# Patient Record
Sex: Male | Born: 1955
Health system: Southern US, Community
[De-identification: ages and names within clinical notes are randomized; demographics above are authoritative.]

## PROBLEM LIST (undated history)

## (undated) DIAGNOSIS — K648 Other hemorrhoids: Secondary | ICD-10-CM

## (undated) DIAGNOSIS — E785 Hyperlipidemia, unspecified: Secondary | ICD-10-CM

## (undated) DIAGNOSIS — K219 Gastro-esophageal reflux disease without esophagitis: Secondary | ICD-10-CM

## (undated) DIAGNOSIS — I2510 Atherosclerotic heart disease of native coronary artery without angina pectoris: Secondary | ICD-10-CM

## (undated) DIAGNOSIS — N400 Enlarged prostate without lower urinary tract symptoms: Secondary | ICD-10-CM

## (undated) DIAGNOSIS — M109 Gout, unspecified: Secondary | ICD-10-CM

## (undated) DIAGNOSIS — I5020 Unspecified systolic (congestive) heart failure: Secondary | ICD-10-CM

## (undated) DIAGNOSIS — Z8249 Family history of ischemic heart disease and other diseases of the circulatory system: Secondary | ICD-10-CM

## (undated) DIAGNOSIS — R0600 Dyspnea, unspecified: Secondary | ICD-10-CM

## (undated) DIAGNOSIS — I10 Essential (primary) hypertension: Secondary | ICD-10-CM

## (undated) DIAGNOSIS — I519 Heart disease, unspecified: Secondary | ICD-10-CM

## (undated) DIAGNOSIS — I444 Left anterior fascicular block: Secondary | ICD-10-CM

## (undated) DIAGNOSIS — I77810 Thoracic aortic ectasia: Secondary | ICD-10-CM

## (undated) DIAGNOSIS — R339 Retention of urine, unspecified: Secondary | ICD-10-CM

## (undated) HISTORY — DX: Atherosclerotic heart disease of native coronary artery without angina pectoris: I25.10

## (undated) HISTORY — PX: RETINAL DETACHMENT SURGERY: SHX105

## (undated) HISTORY — PX: TONSILLECTOMY: SUR1361

## (undated) HISTORY — PX: ABDOMINOPLASTY: SUR9

## (undated) HISTORY — DX: Benign prostatic hyperplasia without lower urinary tract symptoms: N40.0

## (undated) HISTORY — DX: Gout, unspecified: M10.9

## (undated) HISTORY — PX: CATARACT EXTRACTION: SUR2

## (undated) HISTORY — DX: Hyperlipidemia, unspecified: E78.5

## (undated) HISTORY — PX: DENTAL SURGERY: SHX609

## (undated) HISTORY — PX: NO PAST SURGERIES: SHX2092

## (undated) HISTORY — DX: Family history of ischemic heart disease and other diseases of the circulatory system: Z82.49

## (undated) HISTORY — PX: OTHER SURGICAL HISTORY: SHX169

## (undated) HISTORY — DX: Heart disease, unspecified: I51.9

---

## 1997-10-12 ENCOUNTER — Other Ambulatory Visit: Admission: RE | Admit: 1997-10-12 | Discharge: 1997-10-12 | Payer: Self-pay | Admitting: Specialist

## 2006-10-26 ENCOUNTER — Ambulatory Visit: Payer: Self-pay | Admitting: Gastroenterology

## 2016-04-24 ENCOUNTER — Other Ambulatory Visit: Payer: Self-pay | Admitting: Gastroenterology

## 2016-04-27 ENCOUNTER — Other Ambulatory Visit: Payer: Self-pay | Admitting: Gastroenterology

## 2016-06-29 ENCOUNTER — Encounter (HOSPITAL_COMMUNITY): Payer: Self-pay

## 2016-06-29 ENCOUNTER — Encounter (HOSPITAL_COMMUNITY)
Admission: RE | Disposition: A | Discharge status: Home / Self Care | Payer: Self-pay | Source: Ambulatory Visit | Attending: Gastroenterology

## 2016-06-29 ENCOUNTER — Ambulatory Visit (HOSPITAL_COMMUNITY)
Admission: RE | Admit: 2016-06-29 | Discharge: 2016-06-29 | Disposition: A | Discharge status: Home / Self Care | Payer: 59 | Source: Ambulatory Visit | Attending: Gastroenterology | Admitting: Gastroenterology

## 2016-06-29 ENCOUNTER — Ambulatory Visit (HOSPITAL_COMMUNITY): Payer: 59 | Admitting: Anesthesiology

## 2016-06-29 DIAGNOSIS — E039 Hypothyroidism, unspecified: Secondary | ICD-10-CM | POA: Diagnosis not present

## 2016-06-29 DIAGNOSIS — K562 Volvulus: Secondary | ICD-10-CM | POA: Insufficient documentation

## 2016-06-29 DIAGNOSIS — K648 Other hemorrhoids: Secondary | ICD-10-CM | POA: Insufficient documentation

## 2016-06-29 DIAGNOSIS — N400 Enlarged prostate without lower urinary tract symptoms: Secondary | ICD-10-CM | POA: Insufficient documentation

## 2016-06-29 DIAGNOSIS — E739 Lactose intolerance, unspecified: Secondary | ICD-10-CM | POA: Diagnosis not present

## 2016-06-29 DIAGNOSIS — Z1211 Encounter for screening for malignant neoplasm of colon: Secondary | ICD-10-CM | POA: Insufficient documentation

## 2016-06-29 DIAGNOSIS — I10 Essential (primary) hypertension: Secondary | ICD-10-CM | POA: Insufficient documentation

## 2016-06-29 DIAGNOSIS — K219 Gastro-esophageal reflux disease without esophagitis: Secondary | ICD-10-CM | POA: Insufficient documentation

## 2016-06-29 HISTORY — PX: COLONOSCOPY WITH PROPOFOL: SHX5780

## 2016-06-29 HISTORY — DX: Essential (primary) hypertension: I10

## 2016-06-29 SURGERY — COLONOSCOPY WITH PROPOFOL
Anesthesia: Monitor Anesthesia Care

## 2016-06-29 MED ORDER — LIDOCAINE 2% (20 MG/ML) 5 ML SYRINGE
INTRAMUSCULAR | Status: AC
  Filled 2016-06-29: qty 5

## 2016-06-29 MED ORDER — LACTATED RINGERS IV SOLN
INTRAVENOUS | Status: DC
  Administered 2016-06-29: 12:00:00 via INTRAVENOUS

## 2016-06-29 MED ORDER — PROPOFOL 10 MG/ML IV BOLUS
INTRAVENOUS | Status: DC | PRN
  Administered 2016-06-29 (×6): 20 mg via INTRAVENOUS

## 2016-06-29 MED ORDER — PROPOFOL 500 MG/50ML IV EMUL
INTRAVENOUS | Status: DC | PRN
  Administered 2016-06-29: 140 ug/kg/min via INTRAVENOUS

## 2016-06-29 MED ORDER — PROPOFOL 10 MG/ML IV BOLUS
INTRAVENOUS | Status: AC
  Filled 2016-06-29: qty 20

## 2016-06-29 MED ORDER — PROPOFOL 10 MG/ML IV BOLUS
INTRAVENOUS | Status: AC
  Filled 2016-06-29: qty 40

## 2016-06-29 MED ORDER — SODIUM CHLORIDE 0.9 % IV SOLN: INTRAVENOUS | Status: DC

## 2016-06-29 SURGICAL SUPPLY — 22 items

## 2016-06-29 NOTE — Anesthesia Preprocedure Evaluation (Signed)
Anesthesia Evaluation  Patient identified by MRN, date of birth, ID band Patient awake    Reviewed: Allergy & Precautions, NPO status , Patient's Chart, lab work & pertinent test results  History of Anesthesia Complications Negative for: history of anesthetic complications  Airway Mallampati: II  TM Distance: >3 FB Neck ROM: Full    Dental no notable dental hx. (+) Dental Advisory Given   Pulmonary neg pulmonary ROS,    Pulmonary exam normal        Cardiovascular negative cardio ROS Normal cardiovascular exam     Neuro/Psych negative neurological ROS  negative psych ROS   GI/Hepatic negative GI ROS, Neg liver ROS,   Endo/Other  negative endocrine ROS  Renal/GU negative Renal ROS  negative genitourinary   Musculoskeletal negative musculoskeletal ROS (+)   Abdominal   Peds negative pediatric ROS (+)  Hematology negative hematology ROS (+)   Anesthesia Other Findings   Reproductive/Obstetrics negative OB ROS                             Anesthesia Physical Anesthesia Plan  ASA: II  Anesthesia Plan: MAC   Post-op Pain Management:    Induction:   Airway Management Planned: Natural Airway  Additional Equipment:   Intra-op Plan:   Post-operative Plan:   Informed Consent: I have reviewed the patients History and Physical, chart, labs and discussed the procedure including the risks, benefits and alternatives for the proposed anesthesia with the patient or authorized representative who has indicated his/her understanding and acceptance.   Dental advisory given  Plan Discussed with: CRNA, Anesthesiologist and Surgeon  Anesthesia Plan Comments:         Anesthesia Quick Evaluation

## 2016-06-29 NOTE — Transfer of Care (Signed)
Immediate Anesthesia Transfer of Care Note  Patient: Jeff Warren  Procedure(s) Performed: Procedure(s): COLONOSCOPY WITH PROPOFOL (N/A)  Patient Location: Endoscopy Unit  Anesthesia Type:MAC  Level of Consciousness: awake and alert   Airway & Oxygen Therapy: Patient Spontanous Breathing  Post-op Assessment: Report given to RN and Post -op Vital signs reviewed and stable  Post vital signs: Reviewed and stable  Last Vitals:  Vitals:   06/29/16 1214  BP: (!) 159/98  Pulse: 60  Resp: (!) 9  Temp: 36.8 C    Last Pain:  Vitals:   06/29/16 1214  TempSrc: Oral         Complications: No apparent anesthesia complications

## 2016-06-29 NOTE — Op Note (Addendum)
[facility] Patient Name: Jeff Warren Procedure Date: 06/29/2016 MRN: [id number] Attending MD: Charolett Bumpers , MD Date of Birth: 05-May-1956 CSN: [id number] Age: 61 Admit Type: Outpatient Procedure:                Colonoscopy Indications:              Screening for colorectal malignant neoplasm Providers:                Charolett Bumpers, MD, Anthony Sar, RN, Arlee Muslim Tech., Technician, Leroy Libman, CRNA Referring MD:              Medicines:                Propofol per Anesthesia Complications:            No immediate complications. Estimated Blood Loss:     Estimated blood loss: none. Procedure:                Pre-Anesthesia Assessment:                           - Prior to the procedure, a History and Physical                            was performed, and patient medications and                            allergies were reviewed. The patient's tolerance of                            previous anesthesia was also reviewed. The risks                            and benefits of the procedure and the sedation                            options and risks were discussed with the patient.                            All questions were answered, and informed consent                            was obtained. Prior Anticoagulants: The patient has                            taken no previous anticoagulant or antiplatelet                            agents. ASA Grade Assessment: II - A patient with                            mild systemic disease. After reviewing the risks  and benefits, the patient was deemed in                            satisfactory condition to undergo the procedure.                           After obtaining informed consent, the colonoscope                            was passed under direct vision. Throughout the                            procedure, the patient's blood pressure, pulse, and                   oxygen saturations were monitored continuously. The                            EC-[id number] ([id number]) scope was introduced through                            the anus and advanced to the the cecum, identified                            by appendiceal orifice and ileocecal valve. The                            colonoscopy was technically difficult and complex                            due to significant looping. The patient tolerated                            the procedure well. The quality of the bowel                            preparation was good. The appendiceal orifice and                            the rectum were photographed. Scope In: 1:18:19 PM Scope Out: 2:00:47 PM Scope Withdrawal Time: 0 hours 10 minutes 16 seconds  Total Procedure Duration: 0 hours 42 minutes 28 seconds  Findings:      The perianal and digital rectal examinations were normal. Prolapsed       internal hemorrhoids.      The entire examined colon appeared normal. Impression:               - The entire examined colon is normal.                           - No specimens collected. Moderate Sedation:      N/A- Per Anesthesia Care Recommendation:           - Patient has a contact number available for  emergencies. The signs and symptoms of potential                            delayed complications were discussed with the                            patient. Return to normal activities tomorrow.                            Written discharge instructions were provided to the                            patient.                           - Repeat colonoscopy in 10 years for screening                            purposes.                           - Resume previous diet.                           - Continue present medications. Procedure Code(s):        --- Professional ---                           Z6109G0121, Colorectal cancer screening; colonoscopy on                             individual not meeting criteria for high risk Diagnosis Code(s):        --- Professional ---                           Z12.11, Encounter for screening for malignant                            neoplasm of colon CPT copyright 2016 [employer]. All rights reserved. The codes documented in this report are preliminary and upon coder review may  be revised to meet current compliance requirements. Danise EdgeMartin Johnson, MD Charolett BumpersMartin K Johnson, MD 06/29/2016 2:11:02 PM This report has been signed electronically. Number of Addenda: 0

## 2016-06-29 NOTE — H&P (Signed)
Procedure: Screening colonoscopy  History: The patient is a 61 year old male born 11-20-1955. He is scheduled to undergo a screening colonoscopy today  Past medical history: Hypothyroidism. Hypertension. Lactose intolerance. Gastroesophageal reflux. Benign prostatic hypertrophy. Hemorrhoidal banding. Right wrist surgery.  Exam: The patient is alert and lying comfortably on the endoscopy stretcher. Abdomen is soft and nontender to palpation. Lungs are clear to auscultation. Cardiac exam reveals a regular rhythm.  Plan: Proceed with screening colonoscopy

## 2016-06-29 NOTE — Discharge Instructions (Signed)

## 2016-06-29 NOTE — Anesthesia Postprocedure Evaluation (Signed)
Anesthesia Post Note  Patient: Jeff Warren  Procedure(s) Performed: Procedure(s) (LRB): COLONOSCOPY WITH PROPOFOL (N/A)  Patient location during evaluation: Endoscopy Anesthesia Type: MAC Level of consciousness: awake and alert Pain management: pain level controlled Vital Signs Assessment: post-procedure vital signs reviewed and stable Respiratory status: spontaneous breathing and respiratory function stable Cardiovascular status: stable Anesthetic complications: no       Last Vitals:  Vitals:   06/29/16 1410 06/29/16 1420  BP: (!) 105/59 (!) 158/83  Pulse: 76 75  Resp: 17 18  Temp: 36.4 C     Last Pain:  Vitals:   06/29/16 1410  TempSrc: Oral                 Rana Hochstein DANIEL

## 2016-06-30 ENCOUNTER — Encounter (HOSPITAL_COMMUNITY): Payer: Self-pay

## 2016-06-30 ENCOUNTER — Ambulatory Visit (HOSPITAL_COMMUNITY): Admit: 2016-06-30 | Payer: Self-pay | Admitting: Gastroenterology

## 2016-06-30 ENCOUNTER — Encounter (HOSPITAL_COMMUNITY): Payer: Self-pay | Admitting: Gastroenterology

## 2016-06-30 SURGERY — COLONOSCOPY WITH PROPOFOL
Anesthesia: Monitor Anesthesia Care

## 2016-11-07 NOTE — Anesthesia Postprocedure Evaluation (Signed)
Anesthesia Post Note  Patient: Jeff Warren  Procedure(s) Performed: Procedure(s) (LRB): COLONOSCOPY WITH PROPOFOL (N/A)     Anesthesia Type: MAC    Last Vitals:  Vitals:   06/29/16 1410 06/29/16 1420  BP: (!) 105/59 (!) 158/83  Pulse: 76 75  Resp: 17 18  Temp: 36.4 C     Last Pain:  Vitals:   06/29/16 1410  TempSrc: Oral                 Wilsie Kern DANIEL

## 2016-11-07 NOTE — Addendum Note (Signed)
Addendum  created 11/07/16 0826 by Aliscia Clayton, MD   Sign clinical note    

## 2016-12-05 ENCOUNTER — Inpatient Hospital Stay
Admission: EM | Admit: 2016-12-05 | Discharge: 2016-12-07 | DRG: 247 | Disposition: A | Discharge status: Home / Self Care | Payer: 59 | Attending: Internal Medicine | Admitting: Internal Medicine

## 2016-12-05 ENCOUNTER — Emergency Department: Payer: 59

## 2016-12-05 ENCOUNTER — Inpatient Hospital Stay (HOSPITAL_COMMUNITY)
Admit: 2016-12-05 | Discharge: 2016-12-05 | Disposition: A | Discharge status: Home / Self Care | Payer: 59 | Attending: Cardiovascular Disease | Admitting: Cardiovascular Disease

## 2016-12-05 ENCOUNTER — Encounter: Payer: Self-pay | Admitting: Internal Medicine

## 2016-12-05 ENCOUNTER — Encounter
Admission: EM | Disposition: A | Discharge status: Home / Self Care | Payer: Self-pay | Source: Home / Self Care | Attending: Internal Medicine

## 2016-12-05 DIAGNOSIS — M109 Gout, unspecified: Secondary | ICD-10-CM | POA: Diagnosis present

## 2016-12-05 DIAGNOSIS — I370 Nonrheumatic pulmonary valve stenosis: Secondary | ICD-10-CM

## 2016-12-05 DIAGNOSIS — Z8249 Family history of ischemic heart disease and other diseases of the circulatory system: Secondary | ICD-10-CM | POA: Diagnosis not present

## 2016-12-05 DIAGNOSIS — I472 Ventricular tachycardia: Secondary | ICD-10-CM | POA: Diagnosis not present

## 2016-12-05 DIAGNOSIS — Z79899 Other long term (current) drug therapy: Secondary | ICD-10-CM

## 2016-12-05 DIAGNOSIS — N400 Enlarged prostate without lower urinary tract symptoms: Secondary | ICD-10-CM | POA: Diagnosis present

## 2016-12-05 DIAGNOSIS — N179 Acute kidney failure, unspecified: Secondary | ICD-10-CM | POA: Diagnosis present

## 2016-12-05 DIAGNOSIS — I495 Sick sinus syndrome: Secondary | ICD-10-CM | POA: Diagnosis present

## 2016-12-05 DIAGNOSIS — I2111 ST elevation (STEMI) myocardial infarction involving right coronary artery: Secondary | ICD-10-CM | POA: Diagnosis not present

## 2016-12-05 DIAGNOSIS — E785 Hyperlipidemia, unspecified: Secondary | ICD-10-CM | POA: Diagnosis present

## 2016-12-05 DIAGNOSIS — I10 Essential (primary) hypertension: Secondary | ICD-10-CM | POA: Diagnosis present

## 2016-12-05 DIAGNOSIS — I2119 ST elevation (STEMI) myocardial infarction involving other coronary artery of inferior wall: Principal | ICD-10-CM | POA: Diagnosis present

## 2016-12-05 DIAGNOSIS — I2510 Atherosclerotic heart disease of native coronary artery without angina pectoris: Secondary | ICD-10-CM | POA: Diagnosis present

## 2016-12-05 DIAGNOSIS — I255 Ischemic cardiomyopathy: Secondary | ICD-10-CM | POA: Diagnosis present

## 2016-12-05 DIAGNOSIS — Z888 Allergy status to other drugs, medicaments and biological substances status: Secondary | ICD-10-CM | POA: Diagnosis not present

## 2016-12-05 HISTORY — PX: CORONARY/GRAFT ACUTE MI REVASCULARIZATION: CATH118305

## 2016-12-05 HISTORY — PX: LEFT HEART CATH AND CORONARY ANGIOGRAPHY: CATH118249

## 2016-12-05 HISTORY — DX: ST elevation (STEMI) myocardial infarction involving other coronary artery of inferior wall: I21.19

## 2016-12-05 LAB — ECHOCARDIOGRAM COMPLETE
HEIGHTINCHES: 73 in
WEIGHTICAEL: 3760 [oz_av]

## 2016-12-05 LAB — POTASSIUM: POTASSIUM: 3.3 mmol/L — AB (ref 3.5–5.1)

## 2016-12-05 LAB — CBC
HCT: 43.1 % (ref 40.0–52.0)
Hemoglobin: 15.3 g/dL (ref 13.0–18.0)
MCH: 31.9 pg (ref 26.0–34.0)
MCHC: 35.4 g/dL (ref 32.0–36.0)
MCV: 90.1 fL (ref 80.0–100.0)
PLATELETS: 213 10*3/uL (ref 150–440)
RBC: 4.79 MIL/uL (ref 4.40–5.90)
RDW: 14 % (ref 11.5–14.5)
WBC: 6.3 10*3/uL (ref 3.8–10.6)

## 2016-12-05 LAB — PROTIME-INR
INR: 0.92
PROTHROMBIN TIME: 12.4 s (ref 11.4–15.2)

## 2016-12-05 LAB — BASIC METABOLIC PANEL
Anion gap: 5 (ref 5–15)
BUN: 14 mg/dL (ref 6–20)
CALCIUM: 8.9 mg/dL (ref 8.9–10.3)
CHLORIDE: 108 mmol/L (ref 101–111)
CO2: 25 mmol/L (ref 22–32)
CREATININE: 1.28 mg/dL — AB (ref 0.61–1.24)
GFR calc non Af Amer: 59 mL/min — ABNORMAL LOW (ref >60)
Glucose, Bld: 163 mg/dL — ABNORMAL HIGH (ref 65–99)
Potassium: 3.3 mmol/L — ABNORMAL LOW (ref 3.5–5.1)
SODIUM: 138 mmol/L (ref 135–145)

## 2016-12-05 LAB — GLUCOSE, CAPILLARY
Glucose-Capillary: 132 mg/dL — ABNORMAL HIGH (ref 65–99)
Glucose-Capillary: 89 mg/dL (ref 65–99)

## 2016-12-05 LAB — MRSA PCR SCREENING: MRSA BY PCR: NEGATIVE

## 2016-12-05 LAB — APTT: APTT: 26 s (ref 24–36)

## 2016-12-05 LAB — POCT ACTIVATED CLOTTING TIME: Activated Clotting Time: 334 seconds

## 2016-12-05 LAB — TROPONIN I

## 2016-12-05 LAB — MAGNESIUM: MAGNESIUM: 1.8 mg/dL (ref 1.7–2.4)

## 2016-12-05 SURGERY — LEFT HEART CATH AND CORONARY ANGIOGRAPHY
Anesthesia: Moderate Sedation

## 2016-12-05 MED ORDER — HEPARIN SODIUM (PORCINE) 1000 UNIT/ML IJ SOLN
INTRAMUSCULAR | Status: AC
  Filled 2016-12-05: qty 1

## 2016-12-05 MED ORDER — HEPARIN BOLUS VIA INFUSION
4000.0000 [IU] | Freq: Once | INTRAVENOUS | Status: DC
Start: 2016-12-05 — End: 2016-12-05
  Filled 2016-12-05: qty 4000

## 2016-12-05 MED ORDER — ATORVASTATIN CALCIUM 20 MG PO TABS
80.0000 mg | ORAL_TABLET | Freq: Every day | ORAL | Status: DC
  Administered 2016-12-05 – 2016-12-06 (×2): 80 mg via ORAL
  Filled 2016-12-05 (×2): qty 4

## 2016-12-05 MED ORDER — TICAGRELOR 90 MG PO TABS
90.0000 mg | ORAL_TABLET | Freq: Two times a day (BID) | ORAL | Status: DC
  Administered 2016-12-05 – 2016-12-07 (×5): 90 mg via ORAL
  Filled 2016-12-05 (×6): qty 1

## 2016-12-05 MED ORDER — TIROFIBAN HCL IN NACL 5-0.9 MG/100ML-% IV SOLN
INTRAVENOUS | Status: AC
  Filled 2016-12-05: qty 100

## 2016-12-05 MED ORDER — NITROGLYCERIN 5 MG/ML IV SOLN
INTRAVENOUS | Status: AC
  Filled 2016-12-05: qty 10

## 2016-12-05 MED ORDER — HEPARIN (PORCINE) IN NACL 2-0.9 UNIT/ML-% IJ SOLN
INTRAMUSCULAR | Status: AC
Start: 2016-12-05 — End: ?
  Filled 2016-12-05: qty 500

## 2016-12-05 MED ORDER — NITROGLYCERIN 1 MG/10 ML FOR IR/CATH LAB
INTRA_ARTERIAL | Status: DC | PRN
  Administered 2016-12-05: 200 ug via INTRACORONARY

## 2016-12-05 MED ORDER — ASPIRIN 81 MG PO CHEW: 324.0000 mg | CHEWABLE_TABLET | Freq: Once | ORAL | Status: DC

## 2016-12-05 MED ORDER — ASPIRIN 81 MG PO CHEW
CHEWABLE_TABLET | ORAL | Status: AC
  Filled 2016-12-05: qty 4

## 2016-12-05 MED ORDER — ACETAMINOPHEN 325 MG PO TABS: 650.0000 mg | ORAL_TABLET | ORAL | Status: DC | PRN

## 2016-12-05 MED ORDER — OMEPRAZOLE MAGNESIUM 20 MG PO TBEC: 20.0000 mg | DELAYED_RELEASE_TABLET | Freq: Every day | ORAL | Status: DC | PRN

## 2016-12-05 MED ORDER — HEPARIN SODIUM (PORCINE) 5000 UNIT/ML IJ SOLN
4000.0000 [IU] | Freq: Once | INTRAMUSCULAR | Status: AC
  Administered 2016-12-05: 4000 [IU] via INTRAVENOUS

## 2016-12-05 MED ORDER — HEPARIN SODIUM (PORCINE) 1000 UNIT/ML IJ SOLN
INTRAMUSCULAR | Status: DC | PRN
  Administered 2016-12-05: 2000 [IU] via INTRAVENOUS
  Administered 2016-12-05: 5000 [IU] via INTRAVENOUS

## 2016-12-05 MED ORDER — SODIUM CHLORIDE 0.9 % IV SOLN
250.0000 mL | INTRAVENOUS | Status: DC | PRN
Start: 2016-12-05 — End: 2016-12-07

## 2016-12-05 MED ORDER — ASPIRIN 81 MG PO CHEW
81.0000 mg | CHEWABLE_TABLET | Freq: Every day | ORAL | Status: DC
  Administered 2016-12-05 – 2016-12-07 (×3): 81 mg via ORAL
  Filled 2016-12-05 (×3): qty 1

## 2016-12-05 MED ORDER — TIROFIBAN HCL IN NACL 5-0.9 MG/100ML-% IV SOLN: 0.1500 ug/kg/min | INTRAVENOUS | Status: DC

## 2016-12-05 MED ORDER — LISINOPRIL 10 MG PO TABS
10.0000 mg | ORAL_TABLET | Freq: Every day | ORAL | Status: DC
  Administered 2016-12-05 – 2016-12-07 (×3): 10 mg via ORAL
  Filled 2016-12-05 (×2): qty 2
  Filled 2016-12-05: qty 1

## 2016-12-05 MED ORDER — TICAGRELOR 90 MG PO TABS
ORAL_TABLET | ORAL | Status: AC
  Filled 2016-12-05: qty 2

## 2016-12-05 MED ORDER — VERAPAMIL HCL 2.5 MG/ML IV SOLN
INTRAVENOUS | Status: AC
  Filled 2016-12-05: qty 2

## 2016-12-05 MED ORDER — ASPIRIN 81 MG PO CHEW
324.0000 mg | CHEWABLE_TABLET | Freq: Once | ORAL | Status: AC
  Administered 2016-12-05: 324 mg via ORAL

## 2016-12-05 MED ORDER — MIDAZOLAM HCL 2 MG/2ML IJ SOLN
INTRAMUSCULAR | Status: AC
  Filled 2016-12-05: qty 2

## 2016-12-05 MED ORDER — FENTANYL CITRATE (PF) 100 MCG/2ML IJ SOLN
INTRAMUSCULAR | Status: AC
  Filled 2016-12-05: qty 2

## 2016-12-05 MED ORDER — ATROPINE SULFATE 1 MG/10ML IJ SOSY
PREFILLED_SYRINGE | INTRAMUSCULAR | Status: AC
  Filled 2016-12-05: qty 10

## 2016-12-05 MED ORDER — HEPARIN (PORCINE) IN NACL 2-0.9 UNIT/ML-% IJ SOLN
INTRAMUSCULAR | Status: AC
  Filled 2016-12-05: qty 500

## 2016-12-05 MED ORDER — SODIUM CHLORIDE 0.9 % IV BOLUS (SEPSIS)
1000.0000 mL | Freq: Once | INTRAVENOUS | Status: AC
  Administered 2016-12-05: 1000 mL via INTRAVENOUS

## 2016-12-05 MED ORDER — ATROPINE SULFATE 1 MG/10ML IJ SOSY
PREFILLED_SYRINGE | INTRAMUSCULAR | Status: DC | PRN
  Administered 2016-12-05: 1 mg via INTRAVENOUS

## 2016-12-05 MED ORDER — SODIUM CHLORIDE 0.9% FLUSH
3.0000 mL | Freq: Two times a day (BID) | INTRAVENOUS | Status: DC
  Administered 2016-12-05 – 2016-12-07 (×5): 3 mL via INTRAVENOUS

## 2016-12-05 MED ORDER — TIROFIBAN (AGGRASTAT) BOLUS VIA INFUSION
INTRAVENOUS | Status: DC | PRN
Start: 2016-12-05 — End: 2016-12-05
  Administered 2016-12-05: 2665 ug via INTRAVENOUS

## 2016-12-05 MED ORDER — PANTOPRAZOLE SODIUM 40 MG PO TBEC: 40.0000 mg | DELAYED_RELEASE_TABLET | Freq: Every day | ORAL | Status: DC | PRN

## 2016-12-05 MED ORDER — ENOXAPARIN SODIUM 40 MG/0.4ML ~~LOC~~ SOLN
40.0000 mg | SUBCUTANEOUS | Status: DC
  Administered 2016-12-06: 40 mg via SUBCUTANEOUS
  Filled 2016-12-05 (×2): qty 0.4

## 2016-12-05 MED ORDER — SODIUM CHLORIDE 0.9% FLUSH: 3.0000 mL | INTRAVENOUS | Status: DC | PRN

## 2016-12-05 MED ORDER — TIROFIBAN HCL IV 12.5 MG/250 ML
0.1500 ug/kg/min | INTRAVENOUS | Status: AC
  Administered 2016-12-05 (×2): 0.15 ug/kg/min via INTRAVENOUS
  Filled 2016-12-05: qty 250

## 2016-12-05 MED ORDER — SODIUM CHLORIDE 0.9 % IV SOLN
INTRAVENOUS | Status: AC
  Administered 2016-12-05: 07:00:00 via INTRAVENOUS

## 2016-12-05 MED ORDER — TICAGRELOR 90 MG PO TABS
ORAL_TABLET | ORAL | Status: DC | PRN
  Administered 2016-12-05: 180 mg via ORAL

## 2016-12-05 MED ORDER — CARVEDILOL 6.25 MG PO TABS
6.2500 mg | ORAL_TABLET | Freq: Two times a day (BID) | ORAL | Status: DC
  Administered 2016-12-05 – 2016-12-07 (×5): 6.25 mg via ORAL
  Filled 2016-12-05 (×5): qty 1

## 2016-12-05 MED ORDER — HEPARIN (PORCINE) IN NACL 100-0.45 UNIT/ML-% IJ SOLN
1300.0000 [IU]/h | INTRAMUSCULAR | Status: DC
  Administered 2016-12-05: 1300 [IU]/h via INTRAVENOUS
  Filled 2016-12-05: qty 250

## 2016-12-05 MED ORDER — IOPAMIDOL (ISOVUE-300) INJECTION 61%
INTRAVENOUS | Status: DC | PRN
  Administered 2016-12-05: 155 mL via INTRA_ARTERIAL

## 2016-12-05 MED ORDER — TAMSULOSIN HCL 0.4 MG PO CAPS
0.4000 mg | ORAL_CAPSULE | Freq: Every day | ORAL | Status: DC
Start: 2016-12-05 — End: 2016-12-07
  Administered 2016-12-05 – 2016-12-07 (×3): 0.4 mg via ORAL
  Filled 2016-12-05 (×3): qty 1

## 2016-12-05 MED ORDER — ONDANSETRON HCL 4 MG/2ML IJ SOLN: 4.0000 mg | Freq: Four times a day (QID) | INTRAMUSCULAR | Status: DC | PRN

## 2016-12-05 SURGICAL SUPPLY — 15 items
BALLN TREK RX 2.5X15 (BALLOONS) ×3
BALLN ~~LOC~~ TREK RX 4.0X12 (BALLOONS) ×3
BALLOON TREK RX 2.5X15 (BALLOONS) IMPLANT
BALLOON ~~LOC~~ TREK RX 4.0X12 (BALLOONS) IMPLANT
CATH INFINITI 5 FR JL3.5 (CATHETERS) ×2 IMPLANT
CATH INFINITI 5FR ANG PIGTAIL (CATHETERS) ×2 IMPLANT
CATH VISTA GUIDE 6FR JR4 (CATHETERS) ×2 IMPLANT
DEVICE INFLAT 30 PLUS (MISCELLANEOUS) ×2 IMPLANT
DEVICE RAD COMP TR BAND LRG (VASCULAR PRODUCTS) ×2 IMPLANT
GLIDESHEATH SLEND SS 6F .021 (SHEATH) ×2 IMPLANT
KIT MANI 3VAL PERCEP (MISCELLANEOUS) ×3 IMPLANT
PACK CARDIAC CATH (CUSTOM PROCEDURE TRAY) ×3 IMPLANT
STENT RESOLUTE ONYX 3.5X15 (Permanent Stent) ×2 IMPLANT
WIRE INTUITION PROPEL ST 180CM (WIRE) ×2 IMPLANT
WIRE ROSEN-J .035X260CM (WIRE) ×2 IMPLANT

## 2016-12-05 NOTE — H&P (Signed)
Jeff Warren is an 61 y.o. male.   Chief Complaint: Chest pain HPI: The patient with past medical history of hypertension presents to the emergency department with chest pain. His pain began while he was at work which requires him to be on his feet but is not very strenuous. He began to feel lightheaded and became very diaphoretic. The patient denies shortness of breath or significant nausea. She tried to sit down and rest but the pain did not resolve. It was located over his left chest and radiated slightly into his left shoulder. It was like pressure in character and did not resolve after taking aspirin. EKG was concerning for inferior wall ischemia and the patient was taken to the Cath Lab where he received a stent to his RCA. The patient recovered uneventfully and was admitted to the ICU for post catheterization management.  Past Medical History:  Diagnosis Date  . Hypertension     Past Surgical History:  Procedure Laterality Date  . ABDOMINOPLASTY    . COLONOSCOPY WITH PROPOFOL N/A 06/29/2016   Procedure: COLONOSCOPY WITH PROPOFOL;  Surgeon: Garlan Fair, MD;  Location: [facility] ENDOSCOPY;  Service: Endoscopy;  Laterality: N/A;  . ganglion cyst removal Right   . NO PAST SURGERIES     Has has some cosmetic surgeries    Family History  Problem Relation Age of Onset  . Diabetes Mellitus II Mother   . Cancer Father   . Diabetes Mellitus II Sister   . Diabetes Mellitus II Brother   . Kidney failure Brother    Social History:  reports that he has never smoked. He has never used smokeless tobacco. He reports that he does not drink alcohol or use drugs.  Allergies:  Allergies  Allergen Reactions  . Bextra [Valdecoxib] Swelling    Medications Prior to Admission  Medication Sig Dispense Refill  . amLODipine-benazepril (LOTREL) 5-10 MG capsule Take 1 capsule by mouth daily.    . colchicine 0.6 MG tablet Take 0.6 mg by mouth 2 (two) times daily as needed (gout).    Marland Kitchen omeprazole (PRILOSEC  OTC) 20 MG tablet Take 20 mg by mouth daily as needed (hjeartburn).    . probenecid (BENEMID) 500 MG tablet Take 500 mg by mouth 2 (two) times daily.    . tamsulosin (FLOMAX) 0.4 MG CAPS capsule Take 0.4 mg by mouth daily.      Results for orders placed or performed during the hospital encounter of 12/05/16 (from the past 48 hour(s))  Glucose, capillary     Status: Abnormal   Collection Time: 12/05/16  4:03 AM  Result Value Ref Range   Glucose-Capillary 132 (H) 65 - 99 mg/dL   Comment 1 Notify RN    Comment 2 Document in Chart   Basic metabolic panel     Status: Abnormal   Collection Time: 12/05/16  4:07 AM  Result Value Ref Range   Sodium 138 135 - 145 mmol/L   Potassium 3.3 (L) 3.5 - 5.1 mmol/L   Chloride 108 101 - 111 mmol/L   CO2 25 22 - 32 mmol/L   Glucose, Bld 163 (H) 65 - 99 mg/dL   BUN 14 6 - 20 mg/dL   Creatinine, Ser 1.28 (H) 0.61 - 1.24 mg/dL   Calcium 8.9 8.9 - 10.3 mg/dL   GFR calc non Af Amer 59 (L) >60 mL/min   GFR calc Af Amer >60 >60 mL/min    Comment: (NOTE) The eGFR has been calculated using the CKD EPI  equation. This calculation has not been validated in all clinical situations. eGFR's persistently <60 mL/min signify possible Chronic Kidney Disease.    Anion gap 5 5 - 15  CBC     Status: None   Collection Time: 12/05/16  4:07 AM  Result Value Ref Range   WBC 6.3 3.8 - 10.6 K/uL   RBC 4.79 4.40 - 5.90 MIL/uL   Hemoglobin 15.3 13.0 - 18.0 g/dL   HCT 31.2 39.3 - 11.1 %   MCV 90.1 80.0 - 100.0 fL   MCH 31.9 26.0 - 34.0 pg   MCHC 35.4 32.0 - 36.0 g/dL   RDW 51.6 31.1 - 89.1 %   Platelets 213 150 - 440 K/uL  Troponin I     Status: None   Collection Time: 12/05/16  4:07 AM  Result Value Ref Range   Troponin I <0.03 <0.03 ng/mL  APTT     Status: None   Collection Time: 12/05/16  4:07 AM  Result Value Ref Range   aPTT 26 24 - 36 seconds  Protime-INR     Status: None   Collection Time: 12/05/16  4:07 AM  Result Value Ref Range   Prothrombin Time 12.4  11.4 - 15.2 seconds   INR 0.92   POCT Activated clotting time     Status: None   Collection Time: 12/05/16  5:51 AM  Result Value Ref Range   Activated Clotting Time 334 seconds   Dg Chest 2 View  Result Date: 12/05/2016 CLINICAL DATA:  Chest pain and diaphoresis at 1 hour duration EXAM: CHEST  2 VIEW COMPARISON:  None. FINDINGS: The lungs are clear. The pulmonary vasculature is normal. Heart size is normal. Hilar and mediastinal contours are unremarkable. There is no pleural effusion. IMPRESSION: No active cardiopulmonary disease. Electronically Signed   By: Ellery Plunk M.D.   On: 12/05/2016 05:06    Review of Systems  Constitutional: Positive for diaphoresis. Negative for chills and fever.  HENT: Negative for sore throat and tinnitus.   Eyes: Negative for blurred vision and redness.  Respiratory: Negative for cough and shortness of breath.   Cardiovascular: Positive for chest pain. Negative for palpitations, orthopnea and PND.  Gastrointestinal: Positive for nausea. Negative for abdominal pain, diarrhea and vomiting.  Genitourinary: Negative for dysuria, frequency and urgency.  Musculoskeletal: Negative for joint pain and myalgias.  Skin: Negative for rash.       No lesions  Neurological: Negative for speech change, focal weakness and weakness.  Endo/Heme/Allergies: Does not bruise/bleed easily.       No temperature intolerance  Psychiatric/Behavioral: Negative for depression and suicidal ideas.    Blood pressure (!) 132/91, pulse (!) 52, temperature 97.8 F (36.6 C), temperature source Oral, resp. rate (!) 7, height 6\' 1"  (1.854 m), weight 106.6 kg (235 lb), SpO2 100 %. Physical Exam  Constitutional: He is oriented to person, place, and time. He appears well-developed and well-nourished. No distress.  HENT:  Head: Normocephalic and atraumatic.  Mouth/Throat: Oropharynx is clear and moist.  Eyes: Conjunctivae and EOM are normal. Pupils are equal, round, and reactive to  light. No scleral icterus.  Neck: Normal range of motion. Neck supple. No JVD present. No tracheal deviation present. No thyromegaly present.  Cardiovascular: Normal rate and regular rhythm.  Exam reveals no gallop and no friction rub.   No murmur heard. Respiratory: Effort normal and breath sounds normal. No respiratory distress.  GI: Soft. Bowel sounds are normal. He exhibits no distension. There is no tenderness.  Genitourinary:  Genitourinary Comments: Deferred  Musculoskeletal: Normal range of motion. He exhibits no edema.  Lymphadenopathy:    He has no cervical adenopathy.  Neurological: He is alert and oriented to person, place, and time. No cranial nerve deficit.  Skin: Skin is warm and dry. No rash noted. No erythema.  Psychiatric: He has a normal mood and affect. His behavior is normal. Judgment and thought content normal.     Assessment/Plan This is a 61 year old male admitted for STEMI. 1. STEMI: Inferior wall MI status post PCI to RCA. The patient also has an approximately 80% lesion to the LAD which cardiology will follow up as an outpatient. Aggrastat post cath; admission orders per cardiology.  2. Hypertension: Needs improvement; Coreg and lisinopril started by cardiology. May need to resume amlodipine per home regimen. 3. Acute kidney injury: Mild bump in creatinine; GFR greater than 60. Hydrate with intravenous fluid. Avoid nephrotoxic agents. 4. CAD: Aspirin and Lipitor for secondary prevention. Monitor for reperfusion arrhythmia 5. BPH: Continue Flomax 6. DVT prophylaxis: Lovenox 7. GI prophylaxis: Pantoprazole The patient is a full code. Time spent on admission orders and critical care approximately 45 minutes  Harrie Foreman, MD 12/05/2016, 7:03 AM

## 2016-12-05 NOTE — Progress Notes (Signed)
Patient seen and examined.  [age]M with PMH of HTN and family history of premature CAD admitted with chest pain. Inferior wall STEMI and underwent PCI to RCA. Stable post-procedure. Denies chest pain or shortness of breath. No critical care needs. Will be available as needed. Defer management to 1 team / cardiology.  Dorothyann GibbsPrag Casson Catena MD

## 2016-12-05 NOTE — ED Notes (Signed)
Placed patient's shoes, pants, underwear, black zip wallet, clothing, and hat in 2 belonging bags. Bags placed on back of bed

## 2016-12-05 NOTE — Progress Notes (Signed)
ANTICOAGULATION CONSULT NOTE - Initial Consult  Pharmacy Consult for heparin drip Indication: chest pain/ACS/STEMI  Allergies  Allergen Reactions  . Bextra [Valdecoxib] Swelling    Patient Measurements: Height: 6\' 1"  (185.4 cm) Weight: 235 lb (106.6 kg) IBW/kg (Calculated) : 79.9 Heparin Dosing Weight: 102 kg  Vital Signs: Temp: 97.8 F (36.6 C) (06/30 0400) Temp Source: Oral (06/30 0400) BP: 132/91 (06/30 0517) Pulse Rate: 52 (06/30 0517)  Labs:  Recent Labs  12/05/16 0407  HGB 15.3  HCT 43.1  PLT 213  CREATININE 1.28*  TROPONINI <0.03    Estimated Creatinine Clearance: 77.7 mL/min (A) (by C-G formula based on SCr of 1.28 mg/dL (H)).   Medical History: Past Medical History:  Diagnosis Date  . Hypertension     Medications:  No anticoagulation in PTA meds  Assessment:  Goal of Therapy:  Heparin level 0.3-0.7 units/ml Monitor platelets by anticoagulation protocol: Yes   Plan:  4000 unit bolus and initial rate of 1300 units/hr. First heparin level 6 hours after start of infusion.  Maegan Buller S 12/05/2016,5:29 AM

## 2016-12-05 NOTE — Progress Notes (Signed)
CH received PG for a Code Stemi to ED Room 17A. CH reported to ED and talked with nurse who was working with PT. CH asked if any family was present, CH was informed that none was known. CH informed nurse that he was available if needed   12/05/16 0450  Clinical Encounter Type  Visited With Patient;Health care provider  Visit Type Initial  Referral From Nurse  Consult/Referral To Chaplain  Spiritual Encounters  Spiritual Needs Prayer;Other (Comment)  .

## 2016-12-05 NOTE — ED Provider Notes (Signed)
[facility] Emergency Department Provider Note _   First MD Initiated Contact with Patient 12/05/16 0430     (approximate)  I have reviewed the triage vital signs and the nursing notes.   HISTORY  Chief Complaint Chest Pain    HPI Jeff Warren is a 61 y.o. male history of hypertension presents to the emergency department with central chest pain which began at 2:30 AM area patient states his current pain score 7 out of 10 nonradiating. Patient also admits to dyspnea dizziness and diaphoresis.   Past Medical History:  Diagnosis Date  . Hypertension     There are no active problems to display for this patient.   Past Surgical History:  Procedure Laterality Date  . ABDOMINOPLASTY    . COLONOSCOPY WITH PROPOFOL N/A 06/29/2016   Procedure: COLONOSCOPY WITH PROPOFOL;  Surgeon: Charolett Bumpers, MD;  Location: [facility] ENDOSCOPY;  Service: Endoscopy;  Laterality: N/A;  . ganglion cyst removal Right   . NO PAST SURGERIES     Has has some cosmetic surgeries    Prior to Admission medications   Medication Sig Start Date End Date Taking? Authorizing Provider  amLODipine-benazepril (LOTREL) 5-10 MG capsule Take 1 capsule by mouth daily.    [provider]  colchicine 0.6 MG tablet Take 0.6 mg by mouth 2 (two) times daily as needed (gout).    [provider]  omeprazole (PRILOSEC OTC) 20 MG tablet Take 20 mg by mouth daily as needed (hjeartburn).    [provider]  probenecid (BENEMID) 500 MG tablet Take 500 mg by mouth 2 (two) times daily.    [provider]  tamsulosin (FLOMAX) 0.4 MG CAPS capsule Take 0.4 mg by mouth daily.    [provider]    Allergies Bextra [valdecoxib]  No family history on file.  Social History Social History  Substance Use Topics  . Smoking status: Never Smoker  . Smokeless tobacco: Never Used  . Alcohol use No    Review of Systems Constitutional: No fever/chills Eyes: No visual  changes. ENT: No sore throat. Cardiovascular: Positive for chest pain. Respiratory: Denies shortness of breath. Gastrointestinal: No abdominal pain.  No nausea, no vomiting.  No diarrhea.  No constipation. Genitourinary: Negative for dysuria. Musculoskeletal: Negative for neck pain.  Negative for back pain. Integumentary: Negative for rash. Neurological: Negative for headaches, focal weakness or numbness.  ____________________________________________   PHYSICAL EXAM:  VITAL SIGNS: ED Triage Vitals  Enc Vitals Group     BP 12/05/16 0400 99/64     Pulse Rate 12/05/16 0400 (!) 49     Resp 12/05/16 0400 18     Temp 12/05/16 0400 97.8 F (36.6 C)     Temp Source 12/05/16 0400 Oral     SpO2 12/05/16 0400 99 %     Weight 12/05/16 0356 106.6 kg (235 lb)     Height 12/05/16 0356 1.854 m (6\' 1" )     Head Circumference --      Peak Flow --      Pain Score 12/05/16 0354 0     Pain Loc --      Pain Edu? --      Excl. in GC? --     Constitutional: Alert and oriented. Diaphoretic Eyes: Conjunctivae are normal.  Head: Atraumatic. Mouth/Throat: Mucous membranes are moist. Neck: No stridor.   Cardiovascular: Bradycardia, . Good peripheral circulation. Grossly normal heart sounds. Respiratory: Normal respiratory effort.  No retractions. Lungs CTAB. Gastrointestinal: Soft  and nontender. No distention.  Musculoskeletal: No lower extremity tenderness nor edema. No gross deformities of extremities. Neurologic:  Normal speech and language. No gross focal neurologic deficits are appreciated.  Skin:  Skin is warm, dry and intact. No rash noted. Psychiatric: Mood and affect are normal. Speech and behavior are normal.  ____________________________________________   LABS (all labs ordered are listed, but only abnormal results are displayed)  Labs Reviewed  BASIC METABOLIC PANEL - Abnormal; Notable for the following:       Result Value   Potassium 3.3 (*)    Glucose, Bld 163 (*)     Creatinine, Ser 1.28 (*)    GFR calc non Af Amer 59 (*)    All other components within normal limits  GLUCOSE, CAPILLARY - Abnormal; Notable for the following:    Glucose-Capillary 132 (*)    All other components within normal limits  CBC  TROPONIN I   ____________________________________________  EKG  ED ECG REPORT I, [place] N Anjelita Sheahan, the attending physician, personally viewed and interpreted this ECG.   Date: 12/05/2016  EKG Time: 3:56 AM Rate: 50  Rhythm: Sinus bradycardia  Axis: Normal  Intervals: Normal  ST&T Change: None  ED ECG REPORT I, [place] N Sieanna Vanstone, the attending physician, personally viewed and interpreted this ECG.   Date: 12/05/2016  EKG Time: 4:41 AM  Rate: 53  Rhythm: Sinus bradycardia  Axis: Normal  Intervals: Normal  ST&T Change: ST segment elevations in leads 3 and aVF, T-wave inversions V1 and V2   ____________________________________________  RADIOLOGY I,  N Sinead Hockman, personally viewed and evaluated these images (plain radiographs) as part of my medical decision making, as well as reviewing the written report by the radiologist.  No results found.  ____________________________________________   PROCEDURES  Critical Care performed:CRITICAL CARE Performed by: Darci Current   Total critical care time: 40 minutes  Critical care time was exclusive of separately billable procedures and treating other patients.  Critical care was necessary to treat or prevent imminent or life-threatening deterioration.  Critical care was time spent personally by me on the following activities: development of treatment plan with patient and/or surrogate as well as nursing, discussions with consultants, evaluation of patient's response to treatment, examination of patient, obtaining history from patient or surrogate, ordering and performing treatments and interventions, ordering and review of laboratory studies, ordering and review of radiographic  studies, pulse oximetry and re-evaluation of patient's condition.   Procedures   ____________________________________________   INITIAL IMPRESSION / ASSESSMENT AND PLAN / ED COURSE  Pertinent labs & imaging results that were available during my care of the patient were reviewed by me and considered in my medical decision making (see chart for details).  61 year old male presenting with history concerning for acute myocardial infarction. Initial EKG did not reveal any evidence of a STEMI however I repeated the EKG during my evaluation patient which clearly revealed ST segment elevation in leads 3 and aVF with anterior T-wave inversions. As such patient discussed with Dr. Kennith Maes at 4:45 AM who agrees. Plan to have the patient taken to the Cath Lab.    ____________________________________________  FINAL CLINICAL IMPRESSION(S) / ED DIAGNOSES  Final diagnoses:  Acute ST elevation myocardial infarction (STEMI) involving other coronary artery of inferior wall (HCC)     MEDICATIONS GIVEN DURING THIS VISIT:  Medications  heparin bolus via infusion 4,000 Units (not administered)  heparin ADULT infusion 100 units/mL (25000 units/233mL sodium chloride 0.45%) (not administered)  sodium chloride 0.9 % bolus 1,000  mL (1,000 mLs Intravenous New Bag/Given 12/05/16 0419)  aspirin chewable tablet 324 mg (324 mg Oral Given 12/05/16 0443)  heparin injection 4,000 Units (4,000 Units Intravenous Given 12/05/16 0451)     NEW OUTPATIENT MEDICATIONS STARTED DURING THIS VISIT:  New Prescriptions   No medications on file    Modified Medications   No medications on file    Discontinued Medications   No medications on file     Note:  This document was prepared using Dragon voice recognition software and may include unintentional dictation errors.    Darci CurrentBrown, [place] N, MD 12/05/16 [phone]

## 2016-12-05 NOTE — Progress Notes (Signed)
Pt admitted this am with acute IMI s/p DES with modest LV dysfunction 4-45% No chest pain

## 2016-12-05 NOTE — ED Triage Notes (Signed)
Pt reports having chest pain which started x 1 hour ago. Pt is diaphoretic upon this time in triage and denying pain at this time.

## 2016-12-05 NOTE — Progress Notes (Signed)
MEDICATION RELATED CONSULT NOTE - INITIAL   Pharmacy Consult for tirofiban drip Indication: post PCI  Allergies  Allergen Reactions  . Bextra [Valdecoxib] Swelling    Patient Measurements: Height: 6\' 1"  (185.4 cm) Weight: 235 lb (106.6 kg) IBW/kg (Calculated) : 79.9 Adjusted Body Weight: n/a  Vital Signs: Temp: 97.8 F (36.6 C) (06/30 0400) Temp Source: Oral (06/30 0400) BP: 132/91 (06/30 0517) Pulse Rate: 52 (06/30 0517) Intake/Output from previous day: No intake/output data recorded. Intake/Output from this shift: No intake/output data recorded.  Labs:  Recent Labs  12/05/16 0407  WBC 6.3  HGB 15.3  HCT 43.1  PLT 213  APTT 26  CREATININE 1.28*   Estimated Creatinine Clearance: 77.7 mL/min (A) (by C-G formula based on SCr of 1.28 mg/dL (H)).   Microbiology: No results found for this or any previous visit (from the past 720 hour(s)).  Medical History: Past Medical History:  Diagnosis Date  . Hypertension     Medications:    Assessment:   Goal of Therapy:    Plan:  Aggrastat ordered to run 12 hours after initiation. Bolus given at ~0600 per RN.  Oree Mirelez S 12/05/2016,7:09 AM

## 2016-12-05 NOTE — ED Notes (Signed)
Pt. States at around 2:30 this evening pt. States sudden onset of chest pain.

## 2016-12-05 NOTE — Consult Note (Signed)
CARDIOLOGY CONSULT NOTE  Patient ID: Jeff Warren MRN: [id number] DOB/AGE: 1955/11/14 61 y.o.  Admit date: 12/05/2016 Referring Physician:  Dr. Manson Passey Primary Physician: [name], MD Primary Cardiologist : New ([name]) Reason for Consultation : Inferior STEMI  Date:  12/05/2016    Chief Complaint  Patient presents with  . Chest Pain      History of Present Illness: Jeff Warren is a 61 y.o. male whom I was asked to see emergently by Dr. Manson Passey due to chest pain and EKG changes consistent with inferior ST elevation myocardial infarction. The patient has no prior cardiac history and has chronic medical conditions that include essential hypertension, gout and BPH. He is supposed to be taking amlodipine for high blood pressure but he has not been taking the medication. He is also on Flomax and takes colchicine as needed. He is not a smoker and he works at [employer] He has family history of premature coronary artery disease. His sister died of myocardial infarction at the age of 15.  He presented with sudden onset of chest pain that started around 2:30 this morning while he was at work. The chest pain was described as aching and heaviness located in the left side of his chest with radiation to his left shoulder. It was associated with nausea and profuse sweating and mild shortness of breath. It was continuous and did not subside. He also for dizzy and lightheaded but no syncope. He reports no previous similar symptoms. After his symptoms persisted, he was brought to the emergency room. His initial EKG was not diagnostic for inferior ST elevation MI given that there was only 1 mm of elevation in one day. Subsequently, an EKG was repeated which showed inferior ST elevation inferior leads with reciprocal ST depression in aVL.    Past Medical History:  Diagnosis Date  . Hypertension     Past Surgical History:  Procedure Laterality Date  . ABDOMINOPLASTY    . COLONOSCOPY WITH PROPOFOL  N/A 06/29/2016   Procedure: COLONOSCOPY WITH PROPOFOL;  Surgeon: Charolett Bumpers, MD;  Location: [facility];  Service: Endoscopy;  Laterality: N/A;  . ganglion cyst removal Right   . NO PAST SURGERIES     Has has some cosmetic surgeries     Current Facility-Administered Medications  Medication Dose Route Frequency Provider Last Rate Last Dose  . heparin ADULT infusion 100 units/mL (25000 units/283mL sodium chloride 0.45%)  1,300 Units/hr Intravenous Continuous Darci Current, MD 13 mL/hr at 12/05/16 0502 1,300 Units/hr at 12/05/16 0502    Allergies:   Bextra [valdecoxib]    Social History:  The patient  reports that he has never smoked. He has never used smokeless tobacco. He reports that he does not drink alcohol or use drugs.   Family History:  The patient's Family history is remarkable for premature coronary artery disease.   ROS:  Please see the history of present illness.   Otherwise, review of systems are positive for none.   All other systems are reviewed and negative.    PHYSICAL EXAM: VS:  BP (!) 132/91 (BP Location: Right Arm)   Pulse (!) 52   Temp 97.8 F (36.6 C) (Oral)   Resp (!) 7   Ht 6\' 1"  (1.854 m)   Wt 235 lb (106.6 kg)   SpO2 100%   BMI 31.00 kg/m  , BMI Body mass index is 31 kg/m. GEN: Well nourished, well developed, in no acute distress  HEENT: normal  Neck:  no JVD, carotid bruits, or masses Cardiac: RRR; no murmurs, rubs, or gallops,no edema  Respiratory:  clear to auscultation bilaterally, normal work of breathing GI: soft, nontender, nondistended, + BS MS: no deformity or atrophy  Skin: warm and dry, no rash Neuro:  Strength and sensation are intact Psych: euthymic mood, full affect   EKG:   Personally reviewed by me and showed:  Normal sinus rhythm with 1 mm of ST elevation in the inferior leads with reciprocal ST depression in aVL. Telemetry recently reviewed by me and showed: Normal sinus rhythm with sinus bradycardia and intermittent  nonsustained ventricular tachycardia.  Recent Labs: 12/05/2016: BUN 14; Creatinine, Ser 1.28; Hemoglobin 15.3; Platelets 213; Potassium 3.3; Sodium 138    Lipid Panel No results found for: CHOL, TRIG, HDL, CHOLHDL, VLDL, LDLCALC, LDLDIRECT    Wt Readings from Last 3 Encounters:  12/05/16 235 lb (106.6 kg)  06/29/16 228 lb (103.4 kg)      ASSESSMENT AND PLAN:  1.Acute inferior ST elevation myocardial infarction: The patient was given aspirin and unfractionated heparin and was taken emergently to the cath lab. Emergent cardiac catheterization was performed via the right radial artery which confirmed thrombotic occlusion of the distal right coronary artery. This was treated successfully with PCI and drug-eluting stent placement. He has residual significant stenosis in the proximal to mid LAD. EF was mildly reduced. Due to large thrombus burden, he was started on Aggrastat for 12 hours. He was loaded with Brilinta and was started on high dose atorvastatin in addition to carvedilol and lisinopril.  Continue close monitoring in the ICU for 24 hours. The patient did have intermittent nonsustained ventricular tachycardia.  We'll plan on staged LAD PCI in few weeks.  2. Mild ischemic cardiomyopathy: Repeat echocardiogram tomorrow. Carvedilol and lisinopril were started. I  3. Hyperlipidemia: High dose atorvastatin was started. Check lipid and liver profile tomorrow.  4. History of gout: No acute exacerbation.  5. BPH: Continue Flomax.    Signed,  Lorine BearsMuhammad Arida, MD  12/05/2016 6:53 AM    [facility]

## 2016-12-05 NOTE — Progress Notes (Signed)
Patient resting in bed.  No complaints or concerns at this time. Will continue to monitor.

## 2016-12-05 NOTE — ED Notes (Signed)
Per Dr. Keturah BarreArdia, waiting on cath team to arrive before transport to cath lab.

## 2016-12-06 LAB — HEPATIC FUNCTION PANEL
ALBUMIN: 3.4 g/dL — AB (ref 3.5–5.0)
ALK PHOS: 61 U/L (ref 38–126)
ALT: 32 U/L (ref 17–63)
AST: 140 U/L — ABNORMAL HIGH (ref 15–41)
BILIRUBIN TOTAL: 1.2 mg/dL (ref 0.3–1.2)
Bilirubin, Direct: 0.1 mg/dL (ref 0.1–0.5)
Indirect Bilirubin: 1.1 mg/dL — ABNORMAL HIGH (ref 0.3–0.9)
Total Protein: 6.1 g/dL — ABNORMAL LOW (ref 6.5–8.1)

## 2016-12-06 LAB — BASIC METABOLIC PANEL
Anion gap: 3 — ABNORMAL LOW (ref 5–15)
BUN: 14 mg/dL (ref 6–20)
CALCIUM: 8.6 mg/dL — AB (ref 8.9–10.3)
CO2: 24 mmol/L (ref 22–32)
CREATININE: 1 mg/dL (ref 0.61–1.24)
Chloride: 111 mmol/L (ref 101–111)
GFR calc non Af Amer: >60 mL/min (ref >60)
Glucose, Bld: 95 mg/dL (ref 65–99)
Potassium: 4.1 mmol/L (ref 3.5–5.1)
SODIUM: 138 mmol/L (ref 135–145)

## 2016-12-06 LAB — CBC
HCT: 42.4 % (ref 40.0–52.0)
Hemoglobin: 14.7 g/dL (ref 13.0–18.0)
MCH: 32.2 pg (ref 26.0–34.0)
MCHC: 34.7 g/dL (ref 32.0–36.0)
MCV: 92.9 fL (ref 80.0–100.0)
PLATELETS: 189 10*3/uL (ref 150–440)
RBC: 4.57 MIL/uL (ref 4.40–5.90)
RDW: 14.1 % (ref 11.5–14.5)
WBC: 9.3 10*3/uL (ref 3.8–10.6)

## 2016-12-06 LAB — LIPID PANEL
CHOL/HDL RATIO: 6 ratio
CHOLESTEROL: 197 mg/dL (ref 0–200)
HDL: 33 mg/dL — ABNORMAL LOW (ref >40)
LDL Cholesterol: 130 mg/dL — ABNORMAL HIGH (ref 0–99)
Triglycerides: 172 mg/dL — ABNORMAL HIGH (ref <150)
VLDL: 34 mg/dL (ref 0–40)

## 2016-12-06 MED ORDER — POTASSIUM CHLORIDE 20 MEQ PO PACK
60.0000 meq | PACK | Freq: Once | ORAL | Status: AC
  Administered 2016-12-06: 60 meq via ORAL
  Filled 2016-12-06: qty 3

## 2016-12-06 NOTE — Progress Notes (Signed)
Patient in bed resting.  No acute concerns. No complaints of pain.  Lab draw this am. Patient makes needs known.  Will continue to monitor.

## 2016-12-06 NOTE — Progress Notes (Signed)
Progress Note  Patient Name: Jeff Warren Date of Encounter: 12/06/2016  Primary Cardiologist: MA      Patient Profile     61 y.o. male s/p IMI s/p stenting 6/30  Subjective   Without chest pain sob difficulty with meds   Inpatient Medications    Scheduled Meds: . aspirin  81 mg Oral Daily  . atorvastatin  80 mg Oral q1800  . carvedilol  6.25 mg Oral BID WC  . enoxaparin (LOVENOX) injection  40 mg Subcutaneous Q24H  . lisinopril  10 mg Oral Daily  . sodium chloride flush  3 mL Intravenous Q12H  . tamsulosin  0.4 mg Oral Daily  . ticagrelor  90 mg Oral BID   Continuous Infusions: . sodium chloride     PRN Meds: sodium chloride, acetaminophen, ondansetron (ZOFRAN) IV, pantoprazole, sodium chloride flush   Vital Signs    Vitals:   12/06/16 0800 12/06/16 0900 12/06/16 1000 12/06/16 1100  BP: 121/86 (!) 131/93 113/81 111/78  Pulse: (!) 58 65 66 65  Resp: 14 20 16  (!) 22  Temp:      TempSrc:      SpO2: 99% 97% 99% 97%  Weight:      Height:        Intake/Output Summary (Last 24 hours) at 12/06/16 1145 Last data filed at 12/06/16 0934  Gross per 24 hour  Intake          1064.26 ml  Output              925 ml  Net           139.26 ml   Filed Weights   12/05/16 0356 12/05/16 2000  Weight: 235 lb (106.6 kg) 233 lb 11 oz (106 kg)    Telemetry    VTNS  - Personally Reviewed  ECG       Physical Exam   GEN: No acute distress.   Neck: JVD flat Cardiac: RRR, no  murmurs, rubs, or gallops.  Respiratory: Clear to auscultation bilaterally. GI: Soft, nontender, non-distended  MS:  no edema; No deformity. Neuro:  Nonfocal  Psych: Normal affect  Skin Warm and dry   Labs    Chemistry Recent Labs Lab 12/05/16 0407 12/05/16 1718 12/06/16 0546  NA 138  --  138  K 3.3* 3.3* 4.1  CL 108  --  111  CO2 25  --  24  GLUCOSE 163*  --  95  BUN 14  --  14  CREATININE 1.28*  --  1.00  CALCIUM 8.9  --  8.6*  PROT  --   --  6.1*  ALBUMIN  --   --  3.4*  AST   --   --  140*  ALT  --   --  32  ALKPHOS  --   --  61  BILITOT  --   --  1.2  GFRNONAA 59*  --  >60  GFRAA >60  --  >60  ANIONGAP 5  --  3*     Hematology Recent Labs Lab 12/05/16 0407 12/06/16 0546  WBC 6.3 9.3  RBC 4.79 4.57  HGB 15.3 14.7  HCT 43.1 42.4  MCV 90.1 92.9  MCH 31.9 32.2  MCHC 35.4 34.7  RDW 14.0 14.1  PLT 213 189    Cardiac Enzymes Recent Labs Lab 12/05/16 0407  TROPONINI <0.03   No results for input(s): TROPIPOC in the last 168 hours.   BNPNo results for input(s): BNP,  PROBNP in the last 168 hours.   DDimer No results for input(s): DDIMER in the last 168 hours.   Radiology    Dg Chest 2 View  Result Date: 12/05/2016 CLINICAL DATA:  Chest pain and diaphoresis at 1 hour duration EXAM: CHEST  2 VIEW COMPARISON:  None. FINDINGS: The lungs are clear. The pulmonary vasculature is normal. Heart size is normal. Hilar and mediastinal contours are unremarkable. There is no pleural effusion. IMPRESSION: No active cardiopulmonary disease. Electronically Signed   By: Ellery Plunkaniel R Mitchell M.D.   On: 12/05/2016 05:06      Assessment & Plan    MI acute infer with residual LAD disease   LV dysfunction mild  On ACE and BB,statins and DAPT  Anticipate discharge in am  Signed, Sherryl MangesSteven Thelia Tanksley, MD  12/06/2016, 11:45 AM

## 2016-12-06 NOTE — Progress Notes (Signed)
Subjective: Patient has no complaints today. No chest pain or shortness of breath.  Objective: Vital signs in last 24 hours: Temp:  [98.4 F (36.9 C)-98.8 F (37.1 C)] 98.5 F (36.9 C) (07/01 1200) Pulse Rate:  [55-71] 62 (07/01 1200) Resp:  [8-24] 14 (07/01 1200) BP: (111-144)/(78-105) 144/84 (07/01 1200) SpO2:  [96 %-100 %] 98 % (07/01 1200) Weight:  [106 kg (233 lb 11 oz)] 106 kg (233 lb 11 oz) (06/30 2000) Weight change: -0.595 kg (-1 lb 5 oz) Last BM Date: 12/05/16  Intake/Output from previous day: 06/30 0701 - 07/01 0700 In: 584.3 [P.O.:330; I.V.:254.3] Out: 1350 [Urine:1350] Intake/Output this shift: Total I/O In: 480 [P.O.:480] Out: -   General appearance: no distress Resp: clear to auscultation bilaterally and normal percussion bilaterally Cardio: regular rate and rhythm, S1, S2 normal, no murmur, click, rub or gallop GI: soft, non-tender; bowel sounds normal; no masses,  no organomegaly Neurologic: Grossly normal  Lab Results:  Recent Labs  12/05/16 0407 12/06/16 0546  WBC 6.3 9.3  HGB 15.3 14.7  HCT 43.1 42.4  PLT 213 189   BMET  Recent Labs  12/05/16 0407 12/05/16 1718 12/06/16 0546  NA 138  --  138  K 3.3* 3.3* 4.1  CL 108  --  111  CO2 25  --  24  GLUCOSE 163*  --  95  BUN 14  --  14  CREATININE 1.28*  --  1.00  CALCIUM 8.9  --  8.6*    Studies/Results: Dg Chest 2 View  Result Date: 12/05/2016 CLINICAL DATA:  Chest pain and diaphoresis at 1 hour duration EXAM: CHEST  2 VIEW COMPARISON:  None. FINDINGS: The lungs are clear. The pulmonary vasculature is normal. Heart size is normal. Hilar and mediastinal contours are unremarkable. There is no pleural effusion. IMPRESSION: No active cardiopulmonary disease. Electronically Signed   By: Ellery Plunkaniel R Mitchell M.D.   On: 12/05/2016 05:06    Medications: I have reviewed the patient's current medications.  Assessment/Plan: 1. STEMI. Status post stenting of RCA. Patient also has a percent LAD  lesion will be addressed later. Patient tolerated procedure yesterday well. No recurrent symptoms. 2. Hypertension. Medications have been adjusted. We'll monitor and adjust further if necessary. 3. Mild acute kidney injury. Today creatinine is normal. 4. Coronary artery disease. We'll continue current medications. Next line Total time spent 20 minutes  LOS: 1 day   Gracelyn NurseJohnston,  [place] D 12/06/2016, 12:02 PM

## 2016-12-06 NOTE — Progress Notes (Signed)
Patient resting in bed. No acute concerns at this time. Provider notified of K+ of 3.3  Order for PO potassium given.

## 2016-12-07 ENCOUNTER — Telehealth: Payer: Self-pay | Admitting: Physician Assistant

## 2016-12-07 ENCOUNTER — Encounter: Payer: Self-pay | Admitting: Cardiovascular Disease

## 2016-12-07 ENCOUNTER — Telehealth: Payer: Self-pay | Admitting: *Deleted

## 2016-12-07 LAB — HEMOGLOBIN A1C
Hgb A1c MFr Bld: 5.3 % (ref 4.8–5.6)
MEAN PLASMA GLUCOSE: 105 mg/dL

## 2016-12-07 MED ORDER — ASPIRIN 81 MG PO CHEW: 81.0000 mg | CHEWABLE_TABLET | Freq: Every day | ORAL | Status: DC

## 2016-12-07 MED ORDER — PANTOPRAZOLE SODIUM 40 MG PO TBEC: 40.0000 mg | DELAYED_RELEASE_TABLET | Freq: Every day | ORAL | 1 refills | Status: DC | PRN

## 2016-12-07 MED ORDER — TICAGRELOR 90 MG PO TABS: 90.0000 mg | ORAL_TABLET | Freq: Two times a day (BID) | ORAL | 1 refills | Status: DC

## 2016-12-07 MED ORDER — LISINOPRIL 10 MG PO TABS: 10.0000 mg | ORAL_TABLET | Freq: Every day | ORAL | 1 refills | Status: DC

## 2016-12-07 MED ORDER — CARVEDILOL 6.25 MG PO TABS: 6.2500 mg | ORAL_TABLET | Freq: Two times a day (BID) | ORAL | 1 refills | Status: DC

## 2016-12-07 MED ORDER — ATORVASTATIN CALCIUM 80 MG PO TABS: 80.0000 mg | ORAL_TABLET | Freq: Every day | ORAL | 1 refills | Status: DC

## 2016-12-07 NOTE — Telephone Encounter (Signed)
Left voicemail message to call back  

## 2016-12-07 NOTE — Telephone Encounter (Signed)
Per Alycia Rossettiyan, pt needs 7 day f/u. Message Martie LeeSabrina for sooner appt

## 2016-12-07 NOTE — Telephone Encounter (Signed)
Currently admitted.

## 2016-12-07 NOTE — Progress Notes (Signed)
Discharge instructions explained to pt and pts family/ verbalized an understanding/ iv and tele removed/ transported off unit via wheelchair.  

## 2016-12-07 NOTE — Progress Notes (Signed)
Patient Name: Jeff Warren Date of Encounter: 12/07/2016  Primary Cardiologist: new to [facility] - consult by [facility] Problem List     Active Problems:   Acute ST elevation myocardial infarction (STEMI) involving right coronary artery (HCC)   Acute ST elevation myocardial infarction (STEMI) of inferior wall (HCC)     Subjective   Admitted with inferior ST elevation MI. Underwent successful PCI/DES to the RCA. Residual disease of the LAD was noted with plans for staged PCI.   Has ambulated without issues. No chest pain or SOB. Needs Brilinta card. Post cath labs stable.   Inpatient Medications    Scheduled Meds: . aspirin  81 mg Oral Daily  . atorvastatin  80 mg Oral q1800  . carvedilol  6.25 mg Oral BID WC  . enoxaparin (LOVENOX) injection  40 mg Subcutaneous Q24H  . lisinopril  10 mg Oral Daily  . sodium chloride flush  3 mL Intravenous Q12H  . tamsulosin  0.4 mg Oral Daily  . ticagrelor  90 mg Oral BID   Continuous Infusions: . sodium chloride     PRN Meds: sodium chloride, acetaminophen, ondansetron (ZOFRAN) IV, pantoprazole, sodium chloride flush   Vital Signs    Vitals:   12/06/16 1100 12/06/16 1200 12/06/16 2048 12/07/16 0609  BP: 111/78 (!) 144/84 129/73 129/87  Pulse: 65 62 61 (!) 53  Resp: (!) 22 14 18 18   Temp:  98.5 F (36.9 C) 97.5 F (36.4 C) 97.8 F (36.6 C)  TempSrc:  Oral Oral Oral  SpO2: 97% 98% 97% 99%  Weight:      Height:        Intake/Output Summary (Last 24 hours) at 12/07/16 0847 Last data filed at 12/07/16 0616  Gross per 24 hour  Intake              840 ml  Output             1100 ml  Net             -260 ml   Filed Weights   12/05/16 0356 12/05/16 2000  Weight: 235 lb (106.6 kg) 233 lb 11 oz (106 kg)    Physical Exam    GEN: Well nourished, well developed, in no acute distress.  HEENT: Grossly normal.  Neck: Supple, no JVD, carotid bruits, or masses. Cardiac: RRR, no murmurs, rubs, or gallops. No clubbing, cyanosis,  edema.  Radials/DP/PT 2+ and equal bilaterally. Right radial cath site without bleeding, bruising, swelling, erythema, or TTP. Radial pulse 2+.  Respiratory:  Respirations regular and unlabored, clear to auscultation bilaterally. GI: Soft, nontender, nondistended, BS + x 4. MS: no deformity or atrophy. Skin: warm and dry, no rash. Neuro:  Strength and sensation are intact. Psych: AAOx3.  Normal affect.  Labs    CBC  Recent Labs  12/05/16 0407 12/06/16 0546  WBC 6.3 9.3  HGB 15.3 14.7  HCT 43.1 42.4  MCV 90.1 92.9  PLT 213 189   Basic Metabolic Panel  Recent Labs  12/05/16 0407 12/05/16 1718 12/06/16 0546  NA 138  --  138  K 3.3* 3.3* 4.1  CL 108  --  111  CO2 25  --  24  GLUCOSE 163*  --  95  BUN 14  --  14  CREATININE 1.28*  --  1.00  CALCIUM 8.9  --  8.6*  MG  --  1.8  --    Liver Function Tests  Recent Labs  12/06/16 0546  AST 140*  ALT 32  ALKPHOS 61  BILITOT 1.2  PROT 6.1*  ALBUMIN 3.4*   No results for input(s): LIPASE, AMYLASE in the last 72 hours. Cardiac Enzymes  Recent Labs  12/05/16 0407  TROPONINI <0.03   BNP Invalid input(s): POCBNP D-Dimer No results for input(s): DDIMER in the last 72 hours. Hemoglobin A1C No results for input(s): HGBA1C in the last 72 hours. Fasting Lipid Panel  Recent Labs  12/06/16 0546  CHOL 197  HDL 33*  LDLCALC 130*  TRIG 172*  CHOLHDL 6.0   Thyroid Function Tests No results for input(s): TSH, T4TOTAL, T3FREE, THYROIDAB in the last 72 hours.  Invalid input(s): FREET3  Telemetry    NSR, 4 beats of NSVT - Personally Reviewed  ECG    n/a - Personally Reviewed  Radiology    No results found.  Cardiac Studies   LHC 12/05/2016: Conclusion     A STENT RESOLUTE ONYX 3.5X15 drug eluting stent was successfully placed.  Dist RCA lesion, 100 %stenosed.  Post intervention, there is a 0% residual stenosis.  Mid RCA to Dist RCA lesion, 30 %stenosed.  Prox LAD to Mid LAD lesion, 80  %stenosed.  1st Diag lesion, 100 %stenosed.  There is mild left ventricular systolic dysfunction.  LV end diastolic pressure is severely elevated.  The left ventricular ejection fraction is 45-50% by visual estimate.  There is trivial (1+) mitral regurgitation.   1. Significant 2 vessel coronary artery disease. The culprit for inferior ST elevation myocardial infarction in his thrombotic occlusion of the distal right coronary artery. The first diagonal is occluded with right-to-left collaterals. There is 80% stenosis and proximal to mid LAD.  2. Mildly reduced LV systolic function with an ejection fraction of 45%. Severely elevated left ventricular end-diastolic pressure.  3. Successful angioplasty and drug-eluting stent placement to the distal right coronary artery.  Recommendations: Continue Aggrastat for 12 hours due to large thrombus burden. Dual antiplatelet therapy is recommended for at least one year. I started small dose carvedilol and lisinopril. I added high dose atorvastatin. Recommend staged LAD PCI in few weeks. If the patient remains stable, he can be transferred to telemetry on Sunday with plans for discharge home on Monday.     TTE 12/05/2016: Study Conclusions  - Left ventricle: The cavity size was normal. There was moderate   concentric hypertrophy. Systolic function was normal. The   estimated ejection fraction was in the range of 50% to 55%. Mild   hypokinesis of the inferior myocardium. Doppler parameters are   consistent with abnormal left ventricular relaxation (grade 1   diastolic dysfunction). - Left atrium: The atrium was mildly dilated.  Patient Profile     61  y.o. male with history of HTN who was admitted with inferior ST elevation MI.   Assessment & Plan    1. Inferior ST elevation MI/CAD: -Chest pain free -Underwent successful PCI/DES to the RCA with residual LAD diseae -Continue DAPT with ASA 81 mg daily and Brilinta 90 mg bid -Will need  staged PCI of the LAD in a few weeks -Continue Lipitor, Coreg, and lisinopril -Cardiac rehab  2. LV dysfunction: -EF mildly reduced on echo at 50-55% -Coreg and lisinopril as above  3. HLD: -Lipitor -Will need follow up lipid and liver function in ~ 8 weeks  Signed, Eula Listenyan Tekia Waterbury, PA-C [facility] Pager: [phone] 12/07/2016, 8:47 AM

## 2016-12-07 NOTE — Telephone Encounter (Signed)
-----   Message from Coralee RudSabrina F Gilley sent at 12/07/2016  9:50 AM EDT ----- Regarding: tcm/ph 8/2 10:00  Eula Listenyan Dunn, PA

## 2016-12-07 NOTE — Discharge Summary (Signed)
Physician Discharge Summary  Patient ID: Jeff Warren MRN: [id number] DOB/AGE: 61-Aug-1957 61 y.o.  Admit date: 12/05/2016 Discharge date: 12/07/2016  Admission Diagnoses:1. STEMI.  2. Hypertension. 3. Mild acute kidney injury.  4. Coronary artery disease.   Discharge Diagnoses:  Active Problems:   Acute ST elevation myocardial infarction (STEMI) involving right coronary artery (HCC)   Acute ST elevation myocardial infarction (STEMI) of inferior wall [facility](HCC)   Discharged Condition: stable  Hospital Course: 1. STEMI. Status post stenting of RCA. Patient also has a percent LAD lesion will be addressed later.Patient has been stable since procedure. He will be discharged home on aspirin, beta blocker, ACE inhibitor and Brilinta. He will follow-up with Dr. Kirke CorinArida at the [facility] Center. 2. Hypertension. Medications have been adjusted. Blood pressures better controlled. 3. Mild acute kidney injury. Today creatinine is normal. 4. Coronary artery disease. We'll continue current medications.  Total time spent 35 minutes  Consults: cardiology  Significant Diagnostic Studies: Cardiac catheterization. Please see report.    Discharge Exam: Blood pressure 129/87, pulse (!) 53, temperature 97.8 F (36.6 C), temperature source Oral, resp. rate 18, height 6\' 1"  (1.854 m), weight 106 kg (233 lb 11 oz), SpO2 99 %. General appearance: alert Resp: clear to auscultation bilaterally Cardio: regular rate and rhythm, S1, S2 normal, no murmur, click, rub or gallop  Disposition: 01-Home or Self Care  Discharge Instructions    AMB Referral to Cardiac Rehabilitation - Phase II    Complete by:  As directed    Diagnosis:   Coronary Stents STEMI     Diet - low sodium heart healthy    Complete by:  As directed    Increase activity slowly    Complete by:  As directed      Allergies as of 12/07/2016      Reactions   Bextra [valdecoxib] Swelling      Medication List    STOP taking these medications    amLODipine-benazepril 5-20 MG capsule Commonly known as:  LOTREL     TAKE these medications   aspirin 81 MG chewable tablet Chew 1 tablet (81 mg total) by mouth daily. Start taking on:  12/08/2016   atorvastatin 80 MG tablet Commonly known as:  LIPITOR Take 1 tablet (80 mg total) by mouth daily at 6 PM.   carvedilol 6.25 MG tablet Commonly known as:  COREG Take 1 tablet (6.25 mg total) by mouth 2 (two) times daily with a meal.   colchicine 0.6 MG tablet Take 0.6 mg by mouth 2 (two) times daily as needed (gout).   lisinopril 10 MG tablet Commonly known as:  PRINIVIL,ZESTRIL Take 1 tablet (10 mg total) by mouth daily. Start taking on:  12/08/2016   omeprazole 20 MG tablet Commonly known as:  PRILOSEC OTC Take 20 mg by mouth daily as needed (heartburn).   pantoprazole 40 MG tablet Commonly known as:  PROTONIX Take 1 tablet (40 mg total) by mouth daily as needed.   tamsulosin 0.4 MG Caps capsule Commonly known as:  FLOMAX Take 0.4 mg by mouth daily.   ticagrelor 90 MG Tabs tablet Commonly known as:  BRILINTA Take 1 tablet (90 mg total) by mouth 2 (two) times daily.      Follow-up Information    Jeff Warren, Muhammad A, MD Follow up.   Specialty:  Cardiology Contact information: [address] [place] [address] [phone]           Signed: Gracelyn NurseJohnston,  Neftaly D 12/07/2016, 9:38 AM

## 2016-12-07 NOTE — Care Management (Signed)
Met with patient regarding medication assistance for Brilinta after patient consent. Coupon explained to patient. He denies questions. RNCM contact card delivered for patient to call with any questions.

## 2016-12-08 NOTE — Telephone Encounter (Signed)
[facility].  Patient understands to follow up with provider Eula Listenyan Dunn PA on 12/14/16 at 1:00PM [facility]. Patient understands discharge instructions? Yes Patient understands medications and regiment? Yes Patient understands to bring all medications to this visit? Yes  Patient had no questions at this time and confirmed upcoming appointment.

## 2016-12-08 NOTE — Telephone Encounter (Signed)
Pt has already been contacted by our office

## 2016-12-11 ENCOUNTER — Encounter: Payer: Self-pay | Admitting: Physician Assistant

## 2016-12-13 NOTE — Progress Notes (Signed)
Cardiology Office Note Date:  12/14/2016  Patient ID:  Jeff, Warren Mar 27, 1956, MRN [id number] PCP:  [name], MD  Cardiologist:  [name], MD    Chief Complaint: Hospital follow up  History of Present Illness: Jeff Warren is a 61 y.o. male with history of recently diagnosed CAD in the setting of an inferior ST elevation MI on 12/05/2016, systolic dysfunction, family history of premature CAD with his sister dying from an MI at age 55, HTN, gout, and BPH who presents for hospital follow up of recent admission to [facility] from 6/30-7/2 for inferior ST elevation MI.   Prior to the above admission he did not have any previously known cardiac history. Around 2:30 AM on 6/30 he developed sudden chest pain while at work [employer]). Pain was aching and a heavniess located along the left side of the chest with associated nausea, disphoresis, SOB, dizziness, and lightheadedness. His initial EKG was not diagnostic for inferior ST elevation MI given that there was only 1 mm of elevation in one lead. Subsequent EKG showed inferior ST elevation with reciprocal ST depresison in aVL. He was given ASA and heparin, and was taken emergently to the cardiac cath lab that showed a thrombotic occlusion of the distal RCA that was treated successfully with PCI and DES. There was also residual significant stenosis in the proximal to mid LAD estimated at 80%. There was mild 30% stenosis of the mid to distal RCA and an occluded D1 with right-to-left collaterals. EF was mildly reduced by LV gram at 45-50%. Severely elevated LVEDP. Post intervention echo showed an EF of 50-55%, HK of the inferior myocardium, GR1DD, mildly dilated left atrium. He was placed on DAPT with ASA 81 mg daily and Brilinta 90 mg bid. Post intervention CBC and renal function were stable. LDL was noted to be elevated at 130, started on Lipitor while inpatient. HGB A1c 5.3%. AST 140.   He comes in today doing well. No further chest pain. Tolerating DAPT  without any issues. Has missed 1-2 doses of ASA, but not missed any Brilinta. Notes some SOB when ambulating for extended time period as well as mild positional dizziness. Working on eating a healthy diet (does not eat meat). No concerns at this time.    Past Medical History:  Diagnosis Date  . BPH (benign prostatic hyperplasia)   . CAD (coronary artery disease)    a. inferior ST elevation MI 12/05/16: cardiac cath LM nl, p-mLAD 80%, D1 CTO with right-to-left collats, LCx minor irregs,  m-dRCA 30%, dRCA 100% s/p PCI/DES, EF 45-50%, severely elevated LVEDP  . Family history of premature CAD    a. sister passed from MI at age 19  . Gout   . HLD (hyperlipidemia)   . Hypertension   . Systolic dysfunction    a. TTE 12/05/16: EF 50-55%, inf HK, GR1DD, mildly dilated LA    Past Surgical History:  Procedure Laterality Date  . ABDOMINOPLASTY    . COLONOSCOPY WITH PROPOFOL N/A 06/29/2016   Procedure: COLONOSCOPY WITH PROPOFOL;  Surgeon: Charolett Bumpers, MD;  Location: [facility] ENDOSCOPY;  Service: Endoscopy;  Laterality: N/A;  . CORONARY/GRAFT ACUTE MI REVASCULARIZATION N/A 12/05/2016   Procedure: Coronary/Graft Acute MI Revascularization;  Surgeon: Iran Ouch, MD;  Location: [facility] INVASIVE CV LAB;  Service: Cardiovascular;  Laterality: N/A;  . ganglion cyst removal Right   . LEFT HEART CATH AND CORONARY ANGIOGRAPHY N/A 12/05/2016   Procedure: Left Heart Cath and Coronary Angiography;  Surgeon:  Iran Ouch, MD;  Location: [facility] INVASIVE CV LAB;  Service: Cardiovascular;  Laterality: N/A;  . NO PAST SURGERIES     Has has some cosmetic surgeries    Current Meds  Medication Sig  . aspirin 81 MG chewable tablet Chew 1 tablet (81 mg total) by mouth daily.  Marland Kitchen atorvastatin (LIPITOR) 80 MG tablet Take 1 tablet (80 mg total) by mouth daily at 6 PM.  . carvedilol (COREG) 6.25 MG tablet Take 1 tablet (6.25 mg total) by mouth 2 (two) times daily with a meal.  . colchicine 0.6 MG tablet Take 0.6 mg by  mouth 2 (two) times daily as needed (gout).  Marland Kitchen lisinopril (PRINIVIL,ZESTRIL) 10 MG tablet Take 1 tablet (10 mg total) by mouth daily.  Marland Kitchen omeprazole (PRILOSEC OTC) 20 MG tablet Take 20 mg by mouth daily as needed (heartburn).   . pantoprazole (PROTONIX) 40 MG tablet Take 1 tablet (40 mg total) by mouth daily as needed.  . tamsulosin (FLOMAX) 0.4 MG CAPS capsule Take 0.4 mg by mouth daily.  . ticagrelor (BRILINTA) 90 MG TABS tablet Take 1 tablet (90 mg total) by mouth 2 (two) times daily.    Allergies:   Bextra [valdecoxib]   Social History:  The patient  reports that he has never smoked. He has never used smokeless tobacco. He reports that he does not drink alcohol or use drugs.   Family History:  The patient's family history includes Cancer in his father; Diabetes Mellitus II in his brother, mother, and sister; Kidney failure in his brother.  ROS:   Review of Systems  Constitutional: Positive for malaise/fatigue. Negative for chills, diaphoresis, fever and weight loss.  HENT: Negative for congestion.   Eyes: Negative for discharge and redness.  Respiratory: Positive for shortness of breath. Negative for cough, hemoptysis, sputum production and wheezing.   Cardiovascular: Negative for chest pain, palpitations, orthopnea, claudication, leg swelling and PND.  Gastrointestinal: Negative for abdominal pain, blood in stool, heartburn, melena, nausea and vomiting.  Genitourinary: Negative for hematuria.  Musculoskeletal: Negative for falls and myalgias.  Skin: Negative for rash.  Neurological: Negative for dizziness, tingling, tremors, sensory change, speech change, focal weakness, loss of consciousness and weakness.  Endo/Heme/Allergies: Does not bruise/bleed easily.  Psychiatric/Behavioral: Negative for substance abuse. The patient is not nervous/anxious.   All other systems reviewed and are negative.    PHYSICAL EXAM:  VS:  BP 104/78 (BP Location: Left Arm, Patient Position: Sitting, Cuff  Size: Normal)   Pulse 75   Ht 6\' 1"  (1.854 m)   Wt 225 lb 4 oz (102.2 kg)   BMI 29.72 kg/m  BMI: Body mass index is 29.72 kg/m.  Physical Exam  Constitutional: He is oriented to person, place, and time. He appears well-developed and well-nourished.  HENT:  Head: Normocephalic and atraumatic.  Eyes: Right eye exhibits no discharge. Left eye exhibits no discharge.  Neck: Normal range of motion. No JVD present.  Cardiovascular: Normal rate, regular rhythm, S1 normal, S2 normal and normal heart sounds.  Exam reveals no distant heart sounds, no friction rub, no midsystolic click and no opening snap.   No murmur heard. Right radial cath site healing well without active bleeding, bruising, swelling, TTP, or erythema. Radial pulse 2+.  Pulmonary/Chest: Effort normal and breath sounds normal. No respiratory distress. He has no decreased breath sounds. He has no wheezes. He has no rales. He exhibits no tenderness.  Abdominal: Soft. He exhibits no distension. There is no tenderness.  Musculoskeletal: He  exhibits no edema.  Neurological: He is alert and oriented to person, place, and time.  Skin: Skin is warm and dry. No cyanosis. Nails show no clubbing.  Psychiatric: He has a normal mood and affect. His speech is normal and behavior is normal. Judgment and thought content normal.     EKG:  Was ordered and interpreted by me today. Shows NSR, 75 bpm, left axis deviation, inferior Q waves, TWI leads II, III, aVF, V3-V6  Recent Labs: 12/05/2016: Magnesium 1.8 12/06/2016: ALT 32; BUN 14; Creatinine, Ser 1.00; Hemoglobin 14.7; Platelets 189; Potassium 4.1; Sodium 138  12/06/2016: Cholesterol 197; HDL 33; LDL Cholesterol 130; Total CHOL/HDL Ratio 6.0; Triglycerides 172; VLDL 34   Estimated Creatinine Clearance: 97.4 mL/min (by C-G formula based on SCr of 1 mg/dL).   Wt Readings from Last 3 Encounters:  12/14/16 225 lb 4 oz (102.2 kg)  12/05/16 233 lb 11 oz (106 kg)  06/29/16 228 lb (103.4 kg)      Other studies reviewed: Additional studies/records reviewed today include: summarized above  ASSESSMENT AND PLAN:  1. CAD in native vessel s/p recent inferior ST elevation MI: Currently, without chest pain. Has been chest pain free since his discharge. Continue DAPT with AS 81 mg daily and Brilinta 90 mg bid. Will schedule for staged PCI of the LAD with Dr. Kirke CorinArida, MD the following week. Check pre-cath labs. Hold lisinopril day of cath. Risks and benefits of cardiac catheterization have been discussed with the patient including risks of bleeding, bruising, infection, kidney damage, stroke, heart attack, and death. The patient understands these risks and is willing to proceed with the procedure. All questions have been answered and concerns listened to. Cardiac rehab s/p staged PCI.    2. Systolic dysfunction: He does not appear volume overloaded at this time. Will decrease Coreg to 3.125 mg bid given mildly soft BP and reported issues with BPH. Continue lisinopril 10 mg daily. Plan to recheck echo in the next several months to evaluate for improvement in LVSF post intervention as above.   3. Family history of premature CAD: As above. Healthy lifestyle advised.   4. HTN: Well controlled. Continue Coreg and lisinopril as above.   5. HLD: Decrease Lipitor to 40 mg daily given reported issues with BPH. LDL while admitted of 130 (not on statin at that time). Will need follow up lipid and liver function in ~ 6-8 weeks.   Disposition: F/u with Dr. Kirke CorinArida, MD or myself in 2 weeks.   Current medicines are reviewed at length with the patient today. The patient did not have any concerns regarding medicines.  Elinor DodgeSigned, Maddux Vanscyoc PA-C 12/14/2016 1:40 PM     [facility] [address] [place], [place] [address] [phone]

## 2016-12-14 ENCOUNTER — Encounter: Payer: Self-pay | Admitting: *Deleted

## 2016-12-14 ENCOUNTER — Ambulatory Visit (INDEPENDENT_AMBULATORY_CARE_PROVIDER_SITE_OTHER): Payer: 59 | Admitting: Physician Assistant

## 2016-12-14 ENCOUNTER — Telehealth: Payer: Self-pay | Admitting: Physician Assistant

## 2016-12-14 ENCOUNTER — Encounter: Payer: Self-pay | Admitting: Physician Assistant

## 2016-12-14 VITALS — BP 104/78 | HR 75 | Ht 73.0 in | Wt 225.2 lb

## 2016-12-14 DIAGNOSIS — I10 Essential (primary) hypertension: Secondary | ICD-10-CM

## 2016-12-14 DIAGNOSIS — I519 Heart disease, unspecified: Secondary | ICD-10-CM | POA: Insufficient documentation

## 2016-12-14 DIAGNOSIS — Z8249 Family history of ischemic heart disease and other diseases of the circulatory system: Secondary | ICD-10-CM | POA: Diagnosis not present

## 2016-12-14 DIAGNOSIS — I2510 Atherosclerotic heart disease of native coronary artery without angina pectoris: Secondary | ICD-10-CM | POA: Diagnosis not present

## 2016-12-14 DIAGNOSIS — E782 Mixed hyperlipidemia: Secondary | ICD-10-CM

## 2016-12-14 MED ORDER — CARVEDILOL 3.125 MG PO TABS: 3.1250 mg | ORAL_TABLET | Freq: Two times a day (BID) | ORAL | 3 refills | Status: DC

## 2016-12-14 MED ORDER — ATORVASTATIN CALCIUM 40 MG PO TABS: 40.0000 mg | ORAL_TABLET | Freq: Every day | ORAL | 3 refills | Status: DC

## 2016-12-14 MED ORDER — LISINOPRIL 10 MG PO TABS: 10.0000 mg | ORAL_TABLET | Freq: Every day | ORAL | 3 refills | Status: DC

## 2016-12-14 MED ORDER — TICAGRELOR 90 MG PO TABS: 90.0000 mg | ORAL_TABLET | Freq: Two times a day (BID) | ORAL | 3 refills | Status: DC

## 2016-12-14 NOTE — Telephone Encounter (Signed)
Per avs fu in 2 weeks patient has cath 7/18.  Scheduled next available with ryan on 8/2 added to wait list .

## 2016-12-14 NOTE — Addendum Note (Signed)
Addended by: Bryna ColanderALLEN, Gualberto Wahlen S on: 12/14/2016 02:16 PM   Modules accepted: Orders

## 2016-12-14 NOTE — Telephone Encounter (Signed)
Yes, that date is ok. We can fill out any needed paperwork for disability as needed.

## 2016-12-14 NOTE — Patient Instructions (Signed)
Medication Instructions:  Your physician has recommended you make the following change in your medication:  1. DECREASE Lipitor 40 mg once daily 2. DECREASE Carvedilol 3.125 mg Twice daily   Medication Samples have been provided to the patient.  Drug name: Brilinta       Strength: 90 mg        Qty: 8 bottles  LOT: [id number]  Exp.Date: 09/20  Dosing instructions: Take 1 tablet twice daily    Labwork: Labs today were for your upcoming procedure.  Testing/Procedures: You are scheduled for a Cardiac Catheterization on Wednesday, July 18 with Dr. Lorine BearsMuhammad Arida.  1. [facility]: [address] [place], [place] [address] at 6:00 AM (two hours before your procedure to ensure your preparation). Free valet parking service is available.   Special note: Every effort is made to have your procedure done on time. Please understand that emergencies sometimes delay scheduled procedures.  2. Diet: Do not eat or drink anything after midnight prior to your procedure except sips of water to take medications.  3. Labs: None needed.  4. Medication instructions in preparation for your procedure: Do not take your Lisinopril.     On the morning of your procedure, take your Brilinta/Ticagrelor and any morning medicines NOT listed above.  You may use sips of water.  5. Plan for one night stay--bring personal belongings. 6. Bring a current list of your medications and current insurance cards. 7. You MUST have a responsible person to drive you home. 8. Someone MUST be with you the first 24 hours after you arrive home or your discharge will be delayed. 9. Please wear clothes that are easy to get on and off and wear slip-on shoes.  Thank you for allowing us to care for you!   -- [facility]  Follow-Up: Your physician recommends that you schedule a follow-up appointment in: 2 weeks    It was a pleasure  seeing you today here in the office. Please do not hesitate to give us a call back if you have any further questions. [phone]  Atlanta CellarPamela A. RN, BSN

## 2016-12-15 LAB — BASIC METABOLIC PANEL
BUN/Creatinine Ratio: 17 (ref 10–24)
BUN: 16 mg/dL (ref 8–27)
CALCIUM: 9.3 mg/dL (ref 8.6–10.2)
CO2: 22 mmol/L (ref 20–29)
CREATININE: 0.92 mg/dL (ref 0.76–1.27)
Chloride: 106 mmol/L (ref 96–106)
GFR calc Af Amer: 103 mL/min/{1.73_m2} (ref >59)
GFR, EST NON AFRICAN AMERICAN: 89 mL/min/{1.73_m2} (ref >59)
Glucose: 81 mg/dL (ref 65–99)
Potassium: 4.9 mmol/L (ref 3.5–5.2)
Sodium: 144 mmol/L (ref 134–144)

## 2016-12-15 LAB — CBC WITH DIFFERENTIAL/PLATELET
BASOS: 0 %
Basophils Absolute: 0 10*3/uL (ref 0.0–0.2)
EOS (ABSOLUTE): 0.2 10*3/uL (ref 0.0–0.4)
EOS: 3 %
HEMATOCRIT: 46.5 % (ref 37.5–51.0)
HEMOGLOBIN: 15.7 g/dL (ref 13.0–17.7)
IMMATURE GRANULOCYTES: 0 %
Immature Grans (Abs): 0 10*3/uL (ref 0.0–0.1)
LYMPHS ABS: 1.8 10*3/uL (ref 0.7–3.1)
Lymphs: 23 %
MCH: 31.4 pg (ref 26.6–33.0)
MCHC: 33.8 g/dL (ref 31.5–35.7)
MCV: 93 fL (ref 79–97)
MONOCYTES: 8 %
Monocytes Absolute: 0.6 10*3/uL (ref 0.1–0.9)
Neutrophils Absolute: 5.3 10*3/uL (ref 1.4–7.0)
Neutrophils: 66 %
Platelets: 248 10*3/uL (ref 150–379)
RBC: 5 x10E6/uL (ref 4.14–5.80)
RDW: 14.2 % (ref 12.3–15.4)
WBC: 7.9 10*3/uL (ref 3.4–10.8)

## 2016-12-15 LAB — PROTIME-INR
INR: 0.9 (ref 0.8–1.2)
PROTHROMBIN TIME: 9.8 s (ref 9.1–12.0)

## 2016-12-23 ENCOUNTER — Encounter (HOSPITAL_COMMUNITY)
Admission: RE | Disposition: A | Discharge status: Home / Self Care | Payer: Self-pay | Source: Ambulatory Visit | Attending: Cardiovascular Disease

## 2016-12-23 ENCOUNTER — Ambulatory Visit (HOSPITAL_COMMUNITY)
Admission: RE | Admit: 2016-12-23 | Discharge: 2016-12-23 | Disposition: A | Discharge status: Home / Self Care | Payer: 59 | Source: Ambulatory Visit | Attending: Cardiovascular Disease | Admitting: Cardiovascular Disease

## 2016-12-23 ENCOUNTER — Ambulatory Visit: Payer: 59 | Admitting: Physician Assistant

## 2016-12-23 DIAGNOSIS — I2510 Atherosclerotic heart disease of native coronary artery without angina pectoris: Secondary | ICD-10-CM | POA: Insufficient documentation

## 2016-12-23 DIAGNOSIS — Z7982 Long term (current) use of aspirin: Secondary | ICD-10-CM | POA: Diagnosis not present

## 2016-12-23 DIAGNOSIS — Z8249 Family history of ischemic heart disease and other diseases of the circulatory system: Secondary | ICD-10-CM | POA: Insufficient documentation

## 2016-12-23 DIAGNOSIS — Z7902 Long term (current) use of antithrombotics/antiplatelets: Secondary | ICD-10-CM | POA: Diagnosis not present

## 2016-12-23 DIAGNOSIS — I2582 Chronic total occlusion of coronary artery: Secondary | ICD-10-CM | POA: Diagnosis not present

## 2016-12-23 DIAGNOSIS — I252 Old myocardial infarction: Secondary | ICD-10-CM | POA: Insufficient documentation

## 2016-12-23 DIAGNOSIS — N400 Enlarged prostate without lower urinary tract symptoms: Secondary | ICD-10-CM | POA: Insufficient documentation

## 2016-12-23 DIAGNOSIS — Z955 Presence of coronary angioplasty implant and graft: Secondary | ICD-10-CM

## 2016-12-23 DIAGNOSIS — I5020 Unspecified systolic (congestive) heart failure: Secondary | ICD-10-CM | POA: Diagnosis not present

## 2016-12-23 DIAGNOSIS — E785 Hyperlipidemia, unspecified: Secondary | ICD-10-CM | POA: Insufficient documentation

## 2016-12-23 DIAGNOSIS — I110 Hypertensive heart disease with heart failure: Secondary | ICD-10-CM | POA: Insufficient documentation

## 2016-12-23 DIAGNOSIS — M109 Gout, unspecified: Secondary | ICD-10-CM | POA: Diagnosis not present

## 2016-12-23 HISTORY — PX: CORONARY STENT INTERVENTION: CATH118234

## 2016-12-23 LAB — CBC
HCT: 43.7 % (ref 39.0–52.0)
HEMOGLOBIN: 15.9 g/dL (ref 13.0–17.0)
MCH: 32.3 pg (ref 26.0–34.0)
MCHC: 36.4 g/dL — ABNORMAL HIGH (ref 30.0–36.0)
MCV: 88.8 fL (ref 78.0–100.0)
PLATELETS: 232 10*3/uL (ref 150–400)
RBC: 4.92 MIL/uL (ref 4.22–5.81)
RDW: 12.6 % (ref 11.5–15.5)
WBC: 7.2 10*3/uL (ref 4.0–10.5)

## 2016-12-23 LAB — BASIC METABOLIC PANEL
Anion gap: 11 (ref 5–15)
BUN: 18 mg/dL (ref 6–20)
CHLORIDE: 109 mmol/L (ref 101–111)
CO2: 18 mmol/L — ABNORMAL LOW (ref 22–32)
Calcium: 8.8 mg/dL — ABNORMAL LOW (ref 8.9–10.3)
Creatinine, Ser: 1.07 mg/dL (ref 0.61–1.24)
GFR calc Af Amer: >60 mL/min (ref >60)
GFR calc non Af Amer: >60 mL/min (ref >60)
GLUCOSE: 89 mg/dL (ref 65–99)
POTASSIUM: 3.8 mmol/L (ref 3.5–5.1)
Sodium: 138 mmol/L (ref 135–145)

## 2016-12-23 LAB — POCT ACTIVATED CLOTTING TIME
ACTIVATED CLOTTING TIME: 274 s
Activated Clotting Time: 0 seconds

## 2016-12-23 LAB — TROPONIN I
TROPONIN I: 0.03 ng/mL — AB (ref <0.03)
Troponin I: 0.03 ng/mL (ref <0.03)
Troponin I: 0.04 ng/mL (ref <0.03)

## 2016-12-23 LAB — PROTIME-INR
INR: 1.02
Prothrombin Time: 13.4 seconds (ref 11.4–15.2)

## 2016-12-23 SURGERY — CORONARY STENT INTERVENTION
Anesthesia: LOCAL

## 2016-12-23 MED ORDER — HEPARIN (PORCINE) IN NACL 2-0.9 UNIT/ML-% IJ SOLN
INTRAMUSCULAR | Status: AC | PRN
  Administered 2016-12-23: 1000 mL via INTRA_ARTERIAL

## 2016-12-23 MED ORDER — LIDOCAINE HCL (PF) 1 % IJ SOLN
INTRAMUSCULAR | Status: AC
  Filled 2016-12-23: qty 30

## 2016-12-23 MED ORDER — SODIUM CHLORIDE 0.9% FLUSH: 3.0000 mL | INTRAVENOUS | Status: DC | PRN

## 2016-12-23 MED ORDER — IOPAMIDOL (ISOVUE-370) INJECTION 76%
INTRAVENOUS | Status: AC
  Filled 2016-12-23: qty 100

## 2016-12-23 MED ORDER — FENTANYL CITRATE (PF) 100 MCG/2ML IJ SOLN
INTRAMUSCULAR | Status: DC | PRN
  Administered 2016-12-23: 50 ug via INTRAVENOUS

## 2016-12-23 MED ORDER — MIDAZOLAM HCL 2 MG/2ML IJ SOLN
INTRAMUSCULAR | Status: DC | PRN
  Administered 2016-12-23: 1 mg via INTRAVENOUS

## 2016-12-23 MED ORDER — HEPARIN SODIUM (PORCINE) 1000 UNIT/ML IJ SOLN
INTRAMUSCULAR | Status: AC
  Filled 2016-12-23: qty 1

## 2016-12-23 MED ORDER — HEPARIN (PORCINE) IN NACL 2-0.9 UNIT/ML-% IJ SOLN
INTRAMUSCULAR | Status: AC
  Filled 2016-12-23: qty 1000

## 2016-12-23 MED ORDER — NITROGLYCERIN 1 MG/10 ML FOR IR/CATH LAB
INTRA_ARTERIAL | Status: DC | PRN
  Administered 2016-12-23: 200 ug via INTRACORONARY
  Administered 2016-12-23: 100 ug via INTRACORONARY

## 2016-12-23 MED ORDER — VERAPAMIL HCL 2.5 MG/ML IV SOLN
INTRAVENOUS | Status: DC | PRN
  Administered 2016-12-23: 09:00:00 via INTRA_ARTERIAL

## 2016-12-23 MED ORDER — FENTANYL CITRATE (PF) 100 MCG/2ML IJ SOLN
INTRAMUSCULAR | Status: AC
  Filled 2016-12-23: qty 2

## 2016-12-23 MED ORDER — ASPIRIN 81 MG PO CHEW
81.0000 mg | CHEWABLE_TABLET | ORAL | Status: AC
  Administered 2016-12-23: 81 mg via ORAL

## 2016-12-23 MED ORDER — LIDOCAINE HCL (PF) 1 % IJ SOLN
INTRAMUSCULAR | Status: DC | PRN
  Administered 2016-12-23: 2 mL

## 2016-12-23 MED ORDER — SODIUM CHLORIDE 0.9 % IV SOLN: 250.0000 mL | INTRAVENOUS | Status: DC | PRN

## 2016-12-23 MED ORDER — SODIUM CHLORIDE 0.9 % IV SOLN
INTRAVENOUS | Status: AC | PRN
  Administered 2016-12-23: 250 mL via INTRAVENOUS

## 2016-12-23 MED ORDER — SODIUM CHLORIDE 0.9 % IV SOLN
INTRAVENOUS | Status: DC
  Administered 2016-12-23: 07:00:00 via INTRAVENOUS

## 2016-12-23 MED ORDER — VERAPAMIL HCL 2.5 MG/ML IV SOLN
INTRAVENOUS | Status: AC
  Filled 2016-12-23: qty 2

## 2016-12-23 MED ORDER — SODIUM CHLORIDE 0.9 % IV SOLN: INTRAVENOUS | Status: AC

## 2016-12-23 MED ORDER — NITROGLYCERIN 1 MG/10 ML FOR IR/CATH LAB
INTRA_ARTERIAL | Status: AC
  Filled 2016-12-23: qty 10

## 2016-12-23 MED ORDER — MIDAZOLAM HCL 2 MG/2ML IJ SOLN
INTRAMUSCULAR | Status: AC
  Filled 2016-12-23: qty 2

## 2016-12-23 MED ORDER — SODIUM CHLORIDE 0.9% FLUSH: 3.0000 mL | Freq: Two times a day (BID) | INTRAVENOUS | Status: DC

## 2016-12-23 MED ORDER — ASPIRIN 81 MG PO CHEW
CHEWABLE_TABLET | ORAL | Status: AC
  Administered 2016-12-23: 81 mg via ORAL
  Filled 2016-12-23: qty 1

## 2016-12-23 MED ORDER — HEPARIN SODIUM (PORCINE) 1000 UNIT/ML IJ SOLN
INTRAMUSCULAR | Status: DC | PRN
  Administered 2016-12-23: 10000 [IU] via INTRAVENOUS

## 2016-12-23 MED ORDER — IOPAMIDOL (ISOVUE-370) INJECTION 76%
INTRAVENOUS | Status: DC | PRN
  Administered 2016-12-23: 110 mL via INTRA_ARTERIAL

## 2016-12-23 MED ORDER — IOPAMIDOL (ISOVUE-370) INJECTION 76%
INTRAVENOUS | Status: AC
  Filled 2016-12-23: qty 50

## 2016-12-23 SURGICAL SUPPLY — 11 items
CATH VISTA GUIDE 6FR XBLAD3.5 (CATHETERS) ×1 IMPLANT
DEVICE RAD COMP TR BAND LRG (VASCULAR PRODUCTS) ×1 IMPLANT
GLIDESHEATH SLEND SS 6F .021 (SHEATH) ×1 IMPLANT
GUIDEWIRE INQWIRE 1.5J.035X260 (WIRE) IMPLANT
INQWIRE 1.5J .035X260CM (WIRE) ×2
KIT ENCORE 26 ADVANTAGE (KITS) ×1 IMPLANT
KIT HEART LEFT (KITS) ×2 IMPLANT
PACK CARDIAC CATHETERIZATION (CUSTOM PROCEDURE TRAY) ×2 IMPLANT
STENT SVELTE  OTW 3.00 X 23MM (Permanent Stent) ×1 IMPLANT
TRANSDUCER W/STOPCOCK (MISCELLANEOUS) ×2 IMPLANT
TUBING CIL FLEX 10 FLL-RA (TUBING) ×2 IMPLANT

## 2016-12-23 NOTE — H&P (View-Only) (Signed)
Cardiology Office Note Date:  12/14/2016  Patient ID:  [name], [name] Mar 27, 1956, MRN [id number] PCP:  [name], MD  Cardiologist:  [name], MD    Chief Complaint: Hospital follow up  History of Present Illness: Jeff Warren is a 61 y.o. male with history of recently diagnosed CAD in the setting of an inferior ST elevation MI on 12/05/2016, systolic dysfunction, family history of premature CAD with his sister dying from an MI at age 55, HTN, gout, and BPH who presents for hospital follow up of recent admission to [facility] from 6/30-7/2 for inferior ST elevation MI.   Prior to the above admission he did not have any previously known cardiac history. Around 2:30 AM on 6/30 he developed sudden chest pain while at work [employer]). Pain was aching and a heavniess located along the left side of the chest with associated nausea, disphoresis, SOB, dizziness, and lightheadedness. His initial EKG was not diagnostic for inferior ST elevation MI given that there was only 1 mm of elevation in one lead. Subsequent EKG showed inferior ST elevation with reciprocal ST depresison in aVL. He was given ASA and heparin, and was taken emergently to the cardiac cath lab that showed a thrombotic occlusion of the distal RCA that was treated successfully with PCI and DES. There was also residual significant stenosis in the proximal to mid LAD estimated at 80%. There was mild 30% stenosis of the mid to distal RCA and an occluded D1 with right-to-left collaterals. EF was mildly reduced by LV gram at 45-50%. Severely elevated LVEDP. Post intervention echo showed an EF of 50-55%, HK of the inferior myocardium, GR1DD, mildly dilated left atrium. He was placed on DAPT with ASA 81 mg daily and Brilinta 90 mg bid. Post intervention CBC and renal function were stable. LDL was noted to be elevated at 130, started on Lipitor while inpatient. HGB A1c 5.3%. AST 140.   He comes in today doing well. No further chest pain. Tolerating DAPT  without any issues. Has missed 1-2 doses of ASA, but not missed any Brilinta. Notes some SOB when ambulating for extended time period as well as mild positional dizziness. Working on eating a healthy diet (does not eat meat). No concerns at this time.    Past Medical History:  Diagnosis Date  . BPH (benign prostatic hyperplasia)   . CAD (coronary artery disease)    a. inferior ST elevation MI 12/05/16: cardiac cath LM nl, p-mLAD 80%, D1 CTO with right-to-left collats, LCx minor irregs,  m-dRCA 30%, dRCA 100% s/p PCI/DES, EF 45-50%, severely elevated LVEDP  . Family history of premature CAD    a. sister passed from MI at age 19  . Gout   . HLD (hyperlipidemia)   . Hypertension   . Systolic dysfunction    a. TTE 12/05/16: EF 50-55%, inf HK, GR1DD, mildly dilated LA    Past Surgical History:  Procedure Laterality Date  . ABDOMINOPLASTY    . COLONOSCOPY WITH PROPOFOL N/A 06/29/2016   Procedure: COLONOSCOPY WITH PROPOFOL;  Surgeon: Charolett Bumpers, MD;  Location: [facility] ENDOSCOPY;  Service: Endoscopy;  Laterality: N/A;  . CORONARY/GRAFT ACUTE MI REVASCULARIZATION N/A 12/05/2016   Procedure: Coronary/Graft Acute MI Revascularization;  Surgeon: Iran Ouch, MD;  Location: [facility] INVASIVE CV LAB;  Service: Cardiovascular;  Laterality: N/A;  . ganglion cyst removal Right   . LEFT HEART CATH AND CORONARY ANGIOGRAPHY N/A 12/05/2016   Procedure: Left Heart Cath and Coronary Angiography;  Surgeon:  Iran Ouch, MD;  Location: [facility] INVASIVE CV LAB;  Service: Cardiovascular;  Laterality: N/A;  . NO PAST SURGERIES     Has has some cosmetic surgeries    Current Meds  Medication Sig  . aspirin 81 MG chewable tablet Chew 1 tablet (81 mg total) by mouth daily.  Marland Kitchen atorvastatin (LIPITOR) 80 MG tablet Take 1 tablet (80 mg total) by mouth daily at 6 PM.  . carvedilol (COREG) 6.25 MG tablet Take 1 tablet (6.25 mg total) by mouth 2 (two) times daily with a meal.  . colchicine 0.6 MG tablet Take 0.6 mg by  mouth 2 (two) times daily as needed (gout).  Marland Kitchen lisinopril (PRINIVIL,ZESTRIL) 10 MG tablet Take 1 tablet (10 mg total) by mouth daily.  Marland Kitchen omeprazole (PRILOSEC OTC) 20 MG tablet Take 20 mg by mouth daily as needed (heartburn).   . pantoprazole (PROTONIX) 40 MG tablet Take 1 tablet (40 mg total) by mouth daily as needed.  . tamsulosin (FLOMAX) 0.4 MG CAPS capsule Take 0.4 mg by mouth daily.  . ticagrelor (BRILINTA) 90 MG TABS tablet Take 1 tablet (90 mg total) by mouth 2 (two) times daily.    Allergies:   Bextra [valdecoxib]   Social History:  The patient  reports that he has never smoked. He has never used smokeless tobacco. He reports that he does not drink alcohol or use drugs.   Family History:  The patient's family history includes Cancer in his father; Diabetes Mellitus II in his brother, mother, and sister; Kidney failure in his brother.  ROS:   Review of Systems  Constitutional: Positive for malaise/fatigue. Negative for chills, diaphoresis, fever and weight loss.  HENT: Negative for congestion.   Eyes: Negative for discharge and redness.  Respiratory: Positive for shortness of breath. Negative for cough, hemoptysis, sputum production and wheezing.   Cardiovascular: Negative for chest pain, palpitations, orthopnea, claudication, leg swelling and PND.  Gastrointestinal: Negative for abdominal pain, blood in stool, heartburn, melena, nausea and vomiting.  Genitourinary: Negative for hematuria.  Musculoskeletal: Negative for falls and myalgias.  Skin: Negative for rash.  Neurological: Negative for dizziness, tingling, tremors, sensory change, speech change, focal weakness, loss of consciousness and weakness.  Endo/Heme/Allergies: Does not bruise/bleed easily.  Psychiatric/Behavioral: Negative for substance abuse. The patient is not nervous/anxious.   All other systems reviewed and are negative.    PHYSICAL EXAM:  VS:  BP 104/78 (BP Location: Left Arm, Patient Position: Sitting, Cuff  Size: Normal)   Pulse 75   Ht 6\' 1"  (1.854 m)   Wt 225 lb 4 oz (102.2 kg)   BMI 29.72 kg/m  BMI: Body mass index is 29.72 kg/m.  Physical Exam  Constitutional: He is oriented to person, place, and time. He appears well-developed and well-nourished.  HENT:  Head: Normocephalic and atraumatic.  Eyes: Right eye exhibits no discharge. Left eye exhibits no discharge.  Neck: Normal range of motion. No JVD present.  Cardiovascular: Normal rate, regular rhythm, S1 normal, S2 normal and normal heart sounds.  Exam reveals no distant heart sounds, no friction rub, no midsystolic click and no opening snap.   No murmur heard. Right radial cath site healing well without active bleeding, bruising, swelling, TTP, or erythema. Radial pulse 2+.  Pulmonary/Chest: Effort normal and breath sounds normal. No respiratory distress. He has no decreased breath sounds. He has no wheezes. He has no rales. He exhibits no tenderness.  Abdominal: Soft. He exhibits no distension. There is no tenderness.  Musculoskeletal: He  exhibits no edema.  Neurological: He is alert and oriented to person, place, and time.  Skin: Skin is warm and dry. No cyanosis. Nails show no clubbing.  Psychiatric: He has a normal mood and affect. His speech is normal and behavior is normal. Judgment and thought content normal.     EKG:  Was ordered and interpreted by me today. Shows NSR, 75 bpm, left axis deviation, inferior Q waves, TWI leads II, III, aVF, V3-V6  Recent Labs: 12/05/2016: Magnesium 1.8 12/06/2016: ALT 32; BUN 14; Creatinine, Ser 1.00; Hemoglobin 14.7; Platelets 189; Potassium 4.1; Sodium 138  12/06/2016: Cholesterol 197; HDL 33; LDL Cholesterol 130; Total CHOL/HDL Ratio 6.0; Triglycerides 172; VLDL 34   Estimated Creatinine Clearance: 97.4 mL/min (by C-G formula based on SCr of 1 mg/dL).   Wt Readings from Last 3 Encounters:  12/14/16 225 lb 4 oz (102.2 kg)  12/05/16 233 lb 11 oz (106 kg)  06/29/16 228 lb (103.4 kg)      Other studies reviewed: Additional studies/records reviewed today include: summarized above  ASSESSMENT AND PLAN:  1. CAD in native vessel s/p recent inferior ST elevation MI: Currently, without chest pain. Has been chest pain free since his discharge. Continue DAPT with AS 81 mg daily and Brilinta 90 mg bid. Will schedule for staged PCI of the LAD with Dr. Kirke CorinArida, MD the following week. Check pre-cath labs. Hold lisinopril day of cath. Risks and benefits of cardiac catheterization have been discussed with the patient including risks of bleeding, bruising, infection, kidney damage, stroke, heart attack, and death. The patient understands these risks and is willing to proceed with the procedure. All questions have been answered and concerns listened to. Cardiac rehab s/p staged PCI.    2. Systolic dysfunction: He does not appear volume overloaded at this time. Will decrease Coreg to 3.125 mg bid given mildly soft BP and reported issues with BPH. Continue lisinopril 10 mg daily. Plan to recheck echo in the next several months to evaluate for improvement in LVSF post intervention as above.   3. Family history of premature CAD: As above. Healthy lifestyle advised.   4. HTN: Well controlled. Continue Coreg and lisinopril as above.   5. HLD: Decrease Lipitor to 40 mg daily given reported issues with BPH. LDL while admitted of 130 (not on statin at that time). Will need follow up lipid and liver function in ~ 6-8 weeks.   Disposition: F/u with Dr. Kirke CorinArida, MD or myself in 2 weeks.   Current medicines are reviewed at length with the patient today. The patient did not have any concerns regarding medicines.  Elinor DodgeSigned, Rylah Fukuda PA-C 12/14/2016 1:40 PM     [facility] [address] [place], [place] [address] [phone]

## 2016-12-23 NOTE — Progress Notes (Signed)
Lab called nurses station to report troponin level was 0.03. Per Ardine EngJennifer rn /cath lab no further f/u.

## 2016-12-23 NOTE — Progress Notes (Signed)
TR band removed/ dressing placed.

## 2016-12-23 NOTE — Progress Notes (Signed)
[id number] Education completed with pt and significant other. Stressed importance of brilinta with stent. Reviewed NTG use, MI restrictions, risk factors, ex ed and CRP 2 and heart healthy diet. [employer]  brochure and discussed CRP 2.  Will refer. Understanding voiced of teaching. Luetta Nuttingharlene Abdur Hoglund RN BSN 12/23/2016 1:42 PM

## 2016-12-23 NOTE — Progress Notes (Signed)
bp elevated, Spoke with FarmingdaleLyndsy PA cardiology to update no further orders at this time.

## 2016-12-23 NOTE — Discharge Instructions (Signed)

## 2016-12-23 NOTE — Research (Signed)
OPTIMIZE Informed Consent   Subject Name: ELLARD NAN  Subject met inclusion and exclusion criteria.  The informed consent form, study requirements and expectations were reviewed with the subject and questions and concerns were addressed prior to the signing of the consent form.  The subject verbalized understanding of the trail requirements.  The subject agreed to participate in the OPTIMIZE trial and signed the informed consent.  The informed consent was obtained prior to performance of any protocol-specific procedures for the subject.  A copy of the signed informed consent was given to the subject and a copy was placed in the subject's medical record.  Hedrick,Tammy W 12/23/2016, [id number]

## 2016-12-23 NOTE — Interval H&P Note (Signed)
Cath Lab Visit (complete for each Cath Lab visit)  Clinical Evaluation Leading to the Procedure:   ACS: Yes.   (staged)  Non-ACS:         History and Physical Interval Note:  12/23/2016 8:41 AM  Jeff ElliotJohn F Crandall  has presented today for surgery, with the diagnosis of coronary disease   The various methods of treatment have been discussed with the patient and family. After consideration of risks, benefits and other options for treatment, the patient has consented to  Procedure(s): Coronary Stent Intervention (N/A) as a surgical intervention .  The patient's history has been reviewed, patient examined, no change in status, stable for surgery.  I have reviewed the patient's chart and labs.  Questions were answered to the patient's satisfaction.     Lorine BearsMuhammad Arida

## 2016-12-23 NOTE — Discharge Summary (Signed)
Discharge Summary    Patient ID: Jeff Warren,  MRN: [id number], DOB/AGE: 07/03/1955 61 y.o.  Admit date: 12/23/2016 Discharge date: 12/23/2016  Primary Care Provider: Marden NobleGates, Robert Primary Cardiologist: Kirke CorinArida   Discharge Diagnoses    Active Problems:   Coronary artery disease   Allergies Allergies  Allergen Reactions  . Bextra [Valdecoxib] Swelling    Diagnostic Studies/Procedures    LHC: 12/23/16  Conclusion     Dist RCA lesion, 0 %stenosed.  A drug eluting .  Mid RCA to Dist RCA lesion, 30 %stenosed.  1st Diag lesion, 100 %stenosed.  A STENT SVELTE OTW 3.00 X 23MM drug eluting stent was successfully placed.  Prox LAD to Mid LAD lesion, 80 %stenosed.  Post intervention, there is a 0% residual stenosis.   Successful direct stenting of the proximal to mid LAD utilizing Svelte integrated delivery system as part of the optimize research study. There was a small diagonal branch which was jailed by the stent with loss of flow. However, the branch is known to be subtotally occluded with right-to-left collaterals.  Recommendations: Continue dual antiplatelet therapy and aggressive treatment of risk factors.   _____________   History of Present Illness     Jeff Warren is a 61 y.o. male with history of recently diagnosed CAD in the setting of an inferior ST elevation MI on 12/05/2016, systolic dysfunction, family history of premature CAD with his sister dying from an MI at age 61, HTN, gout, and BPH who presents for hospital follow up of [facility]7/2 for inferior ST elevation MI.   Prior to the above admission he did not have any previously known cardiac history. Around 2:30 AM on 6/30 he developed sudden chest pain while at work [employer] Pain was aching and a heavniess located along the left side of the chest with associated nausea, disphoresis, SOB, dizziness, and lightheadedness. His initial EKG was not diagnostic for inferior ST  elevation MI given that there was only 1 mm of elevation in one lead. Subsequent EKG showed inferior ST elevation with reciprocal ST depresison in aVL. He was given ASA and heparin, and was taken emergently to the cardiac cath lab that showed a thrombotic occlusion of the distal RCA that was treated successfully with PCI and DES. There was also residual significant stenosis in the proximal to mid LAD estimated at 80%. There was mild 30% stenosis of the mid to distal RCA and an occluded D1 with right-to-left collaterals. EF was mildly reduced by LV gram at 45-50%. Severely elevated LVEDP. Post intervention echo showed an EF of 50-55%, HK of the inferior myocardium, GR1DD, mildly dilated left atrium. He was placed on DAPT with ASA 81 mg daily and Brilinta 90 mg bid. Post intervention CBC and renal function were stable. LDL was noted to be elevated at 130, started on Lipitor while inpatient. HGB A1c 5.3%. AST 140.   He followed up in the office on 12/14/16 with Eula Listenyan Dunn. Reported no further chest pain. Tolerating DAPT without any issues. Has missed 1-2 doses of ASA, but not missed any Brilinta. Notes some SOB when ambulating for extended time period as well as mild positional dizziness. Working on eating a healthy diet (does not eat meat). He was arranged for staged intervention to the LAD.   Hospital Course     Underwent LHC with staged PCI to the p/mLAD using Svelte delivery system as part of the optimize study. Did have a small diag that  was jailed by the stent with loss of flow. Had known R to L collaterals. Plan to continue DAPT. Post cath radial was stable. Seen by cardiac rehab prior to DC. Post cath restrictions given. Follow up has been arranged in the office.  _____________  Discharge Vitals Blood pressure 110/84, pulse 83, temperature 98.6 F (37 C), temperature source Oral, resp. rate (!) 21, height 6\' 1"  (1.854 m), weight 225 lb (102.1 kg), SpO2 99 %.  Filed Weights   12/23/16 0616  Weight:  225 lb (102.1 kg)    Labs & Radiologic Studies    CBC  Recent Labs  12/23/16 0850  WBC 7.2  HGB 15.9  HCT 43.7  MCV 88.8  PLT 232   Basic Metabolic Panel  Recent Labs  12/23/16 0850  NA 138  K 3.8  CL 109  CO2 18*  GLUCOSE 89  BUN 18  CREATININE 1.07  CALCIUM 8.8*   Liver Function Tests No results for input(s): AST, ALT, ALKPHOS, BILITOT, PROT, ALBUMIN in the last 72 hours. No results for input(s): LIPASE, AMYLASE in the last 72 hours. Cardiac Enzymes  Recent Labs  12/23/16 0850 12/23/16 1335  TROPONINI 0.03* 0.03*   BNP Invalid input(s): POCBNP D-Dimer No results for input(s): DDIMER in the last 72 hours. Hemoglobin A1C No results for input(s): HGBA1C in the last 72 hours. Fasting Lipid Panel No results for input(s): CHOL, HDL, LDLCALC, TRIG, CHOLHDL, LDLDIRECT in the last 72 hours. Thyroid Function Tests No results for input(s): TSH, T4TOTAL, T3FREE, THYROIDAB in the last 72 hours.  Invalid input(s): FREET3 _____________  Dg Chest 2 View  Result Date: 12/05/2016 CLINICAL DATA:  Chest pain and diaphoresis at 1 hour duration EXAM: CHEST  2 VIEW COMPARISON:  None. FINDINGS: The lungs are clear. The pulmonary vasculature is normal. Heart size is normal. Hilar and mediastinal contours are unremarkable. There is no pleural effusion. IMPRESSION: No active cardiopulmonary disease. Electronically Signed   By: Ellery Plunk M.D.   On: 12/05/2016 05:06   Disposition   Pt is being discharged home today in good condition.  Follow-up Plans & Appointments    Follow-up Information    Sondra Barges, PA-C Follow up on 01/07/2017.   Specialties:  Physician Assistant, Cardiology, Radiology Why:  at 8:30am for your follow up appt Contact information: [address] [place] [address] [phone]          Discharge Instructions    Amb Referral to Cardiac Rehabilitation    Complete by:  As directed    Had STEMI 6/30   Diagnosis:   Coronary Stents   Call MD for:  redness, tenderness, or signs of infection (pain, swelling, redness, odor or green/yellow discharge around incision site)    Complete by:  As directed    Diet - low sodium heart healthy    Complete by:  As directed    Discharge instructions    Complete by:  As directed    Radial Site Care Refer to this sheet in the next few weeks. These instructions provide you with information on caring for yourself after your procedure. Your caregiver may also give you more specific instructions. Your treatment has been planned according to current medical practices, but problems sometimes occur. Call your caregiver if you have any problems or questions after your procedure. HOME CARE INSTRUCTIONS You may shower the day after the procedure.Remove the bandage (dressing) and gently wash the site with plain soap and water.Gently pat the site dry.  Do not apply powder or lotion to the site.  Do not submerge the affected site in water for 3 to 5 days.  Inspect the site at least twice daily.  Do not flex or bend the affected arm for 24 hours.  No lifting over 5 pounds (2.3 kg) for 5 days after your procedure.  Do not drive home if you are discharged the same day of the procedure. Have someone else drive you.  You may drive 24 hours after the procedure unless otherwise instructed by your caregiver.  What to expect: Any bruising will usually fade within 1 to 2 weeks.  Blood that collects in the tissue (hematoma) may be painful to the touch. It should usually decrease in size and tenderness within 1 to 2 weeks.  SEEK IMMEDIATE MEDICAL CARE IF: You have unusual pain at the radial site.  You have redness, warmth, swelling, or pain at the radial site.  You have drainage (other than a small amount of blood on the dressing).  You have chills.  You have a fever or persistent symptoms for more than 72 hours.  You have a fever and your symptoms suddenly get worse.  Your arm becomes  pale, cool, tingly, or numb.  You have heavy bleeding from the site. Hold pressure on the site.   Increase activity slowly    Complete by:  As directed       Discharge Medications   Current Discharge Medication List    CONTINUE these medications which have NOT CHANGED   Details  aspirin 81 MG chewable tablet Chew 1 tablet (81 mg total) by mouth daily.    atorvastatin (LIPITOR) 40 MG tablet Take 1 tablet (40 mg total) by mouth daily. Qty: 90 tablet, Refills: 3    carvedilol (COREG) 3.125 MG tablet Take 1 tablet (3.125 mg total) by mouth 2 (two) times daily. Qty: 180 tablet, Refills: 3    colchicine 0.6 MG tablet Take 0.6 mg by mouth 2 (two) times daily as needed (gout).    lisinopril (PRINIVIL,ZESTRIL) 10 MG tablet Take 1 tablet (10 mg total) by mouth daily. Qty: 90 tablet, Refills: 3    pantoprazole (PROTONIX) 40 MG tablet Take 1 tablet (40 mg total) by mouth daily as needed. Qty: 30 tablet, Refills: 1    tamsulosin (FLOMAX) 0.4 MG CAPS capsule Take 0.4 mg by mouth daily.    ticagrelor (BRILINTA) 90 MG TABS tablet Take 1 tablet (90 mg total) by mouth 2 (two) times daily. Qty: 180 tablet, Refills: 3         Aspirin prescribed at discharge?  Yes High Intensity Statin Prescribed? (Lipitor 40-80mg  or Crestor 20-40mg ): Yes Beta Blocker Prescribed? Yes For EF <40%, was ACEI/ARB Prescribed? Yes ADP Receptor Inhibitor Prescribed? (i.e. Plavix etc.-Includes Medically Managed Patients): Yes For EF <40%, Aldosterone Inhibitor Prescribed? No: EF ok Was EF assessed during THIS hospitalization? Yes Was Cardiac Rehab II ordered? (Included Medically managed Patients): Yes   Outstanding Labs/Studies   None  Duration of Discharge Encounter   Greater than 30 minutes including physician time.  Signed, Laverda Page NP-C 12/23/2016, 4:16 PM

## 2016-12-24 ENCOUNTER — Encounter (HOSPITAL_COMMUNITY): Payer: Self-pay | Admitting: Cardiovascular Disease

## 2016-12-29 ENCOUNTER — Telehealth: Payer: Self-pay | Admitting: Cardiovascular Disease

## 2016-12-29 NOTE — Telephone Encounter (Signed)
Received records request from [facility], [facility]

## 2017-01-04 ENCOUNTER — Encounter: Payer: Self-pay | Admitting: Physician Assistant

## 2017-01-06 NOTE — Telephone Encounter (Signed)
Patient came to office with Attending Physicians statement .  Faxed [facility] and copy of $25 check for fee. Mailed to [facility].

## 2017-01-06 NOTE — Progress Notes (Signed)
Cardiology Office Note Date:  01/07/2017  Patient ID:  Jeff FeinsteinJohn F Warren, DOB 07/18/1955, MRN [id number] PCP:  Marden NobleGates, Robert, MD  Cardiologist:  Dr. Kirke CorinArida, MD    Chief Complaint: Follow up staged PCI  History of Present Illness: Jeff ElliotJohn F Warren is a 61 y.o. male with history of recently diagnosed CAD in the setting of an inferior ST elevation MI on 12/05/2016 s/p PCI/DES to the distal RCA, systolic dysfunction, family history of premature CAD with his sister dying from an MI at age 61, HTN, gout, and BPH who presents for hospital follow up of staged PCI on 12/23/16.   Prior to the above admission in June 2018, he did not have any previously known cardiac history. Around 2:30 AM on 6/30 he developed sudden chest pain while at work [employer] Pain was aching and a heavniess located along the left side of the chest with associated nausea, disphoresis, SOB, dizziness, and lightheadedness. His initial EKG was not diagnostic for inferior ST elevation MI given that there was only 1 mm of elevation in one lead. Subsequent EKG showed inferior ST elevation with reciprocal ST depression in aVL. He was given ASA and heparin, and was taken emergently to the cardiac cath lab that showed a thrombotic occlusion of the distal RCA that was treated successfully with PCI and DES. There was also residual significant stenosis in the proximal to mid LAD estimated at 80%. There was mild 30% stenosis of the mid to distal RCA and an occluded D1 with right-to-left collaterals. EF was mildly reduced by LV gram at 45-50%. Severely elevated LVEDP. Post intervention echo showed an EF of 50-55%, HK of the inferior myocardium, GR1DD, mildly dilated left atrium. He was placed on DAPT with ASA 81 mg daily and Brilinta 90 mg bid. In follow up on 7/9, he was doing well. He was scheduled for staged PCI on 12/23/16 and underwent successful PCI/DES to the proximal to mid LAD with a Svelte (integrated delivery system as part of the [employer] research study,  3.0 x 23 mm) with 0% residual stenosis. There was a small diagonal branch which was jailed by the stent with loss of flow; however, the branch was known to be sub-totally occluded with right-to-left collaterals. Post cath labs were stable.  He comes in doing well today. No further chest pain. No shortness of breath. Tolerating all medications without issues. He has not missed any aspirin or Brilinta. Does note mild shortness of breath if he is walking at a fast pace for great distances. Lower extreme swelling is much improved. No early satiety, orthopnea, cough, or abdominal distention. Ready for cardiac rehabilitation. Eating healthier diet. No issues with his catheter site.   Past Medical History:  Diagnosis Date  . BPH (benign prostatic hyperplasia)   . CAD (coronary artery disease)    a. inferior ST elevation MI 12/05/16: cardiac cath LM nl, p-mLAD 80%, D1 CTO with right-to-left collats, LCx minor irregs,  m-dRCA 30%, dRCA 100% s/p PCI/DES, EF 45-50%, severely elevated LVEDP; b. staged PCI 12/23/16 - successful PCI/DES to p-mLAD with small diag being jailed by stent (vessel known to be subtotally occluded with R-L collats)  . Family history of premature CAD    a. sister passed from MI at age 61  . Gout   . HLD (hyperlipidemia)   . Hypertension   . Systolic dysfunction    a. TTE 12/05/16: EF 50-55%, inf HK, GR1DD, mildly dilated LA    Past Surgical History:  Procedure Laterality  Date  . ABDOMINOPLASTY    . COLONOSCOPY WITH PROPOFOL N/A 06/29/2016   Procedure: COLONOSCOPY WITH PROPOFOL;  Surgeon: Charolett BumpersMartin K Johnson, MD;  Location: [facility];  Service: Endoscopy;  Laterality: N/A;  . CORONARY STENT INTERVENTION N/A 12/23/2016   Procedure: Coronary Stent Intervention;  Surgeon: Iran OuchArida, Muhammad A, MD;  Location: [facility];  Service: Cardiovascular;  Laterality: N/A;  . CORONARY/GRAFT ACUTE MI REVASCULARIZATION N/A 12/05/2016   Procedure: Coronary/Graft Acute MI Revascularization;   Surgeon: Iran OuchArida, Muhammad A, MD;  Location: [facility] INVASIVE CV LAB;  Service: Cardiovascular;  Laterality: N/A;  . ganglion cyst removal Right   . LEFT HEART CATH AND CORONARY ANGIOGRAPHY N/A 12/05/2016   Procedure: Left Heart Cath and Coronary Angiography;  Surgeon: Iran OuchArida, Muhammad A, MD;  Location: [facility] INVASIVE CV LAB;  Service: Cardiovascular;  Laterality: N/A;  . NO PAST SURGERIES     Has has some cosmetic surgeries    Current Meds  Medication Sig  . aspirin 81 MG chewable tablet Chew 1 tablet (81 mg total) by mouth daily.  Marland Kitchen. atorvastatin (LIPITOR) 40 MG tablet Take 1 tablet (40 mg total) by mouth daily. (Patient taking differently: Take 40 mg by mouth daily at 6 PM. )  . carvedilol (COREG) 3.125 MG tablet Take 1 tablet (3.125 mg total) by mouth 2 (two) times daily.  . colchicine 0.6 MG tablet Take 0.6 mg by mouth 2 (two) times daily as needed (gout).  Marland Kitchen. lisinopril (PRINIVIL,ZESTRIL) 10 MG tablet Take 1 tablet (10 mg total) by mouth daily.  . pantoprazole (PROTONIX) 40 MG tablet Take 1 tablet (40 mg total) by mouth daily as needed. (Patient taking differently: Take 40 mg by mouth daily as needed (acid reflux). )  . tamsulosin (FLOMAX) 0.4 MG CAPS capsule Take 0.4 mg by mouth daily.  . ticagrelor (BRILINTA) 90 MG TABS tablet Take 1 tablet (90 mg total) by mouth 2 (two) times daily.    Allergies:   Bextra [valdecoxib]   Social History:  The patient  reports that he has never smoked. He has never used smokeless tobacco. He reports that he does not drink alcohol or use drugs.   Family History:  The patient's family history includes Cancer in his father; Diabetes Mellitus II in his brother, mother, and sister; Kidney failure in his brother.  ROS:   Review of Systems  Constitutional: Negative for chills, diaphoresis, fever, malaise/fatigue and weight loss.  HENT: Negative for congestion.   Eyes: Negative for discharge and redness.  Respiratory: Positive for shortness of breath. Negative for  cough, hemoptysis, sputum production and wheezing.   Cardiovascular: Negative for chest pain, palpitations, orthopnea, claudication, leg swelling and PND.  Gastrointestinal: Negative for abdominal pain, blood in stool, heartburn, melena, nausea and vomiting.  Genitourinary: Negative for hematuria.  Musculoskeletal: Negative for falls and myalgias.  Skin: Negative for rash.  Neurological: Negative for dizziness, tingling, tremors, sensory change, speech change, focal weakness, loss of consciousness and weakness.  Endo/Heme/Allergies: Does not bruise/bleed easily.  Psychiatric/Behavioral: Negative for substance abuse. The patient is not nervous/anxious.   All other systems reviewed and are negative.    PHYSICAL EXAM:  VS:  BP 110/80 (BP Location: Left Arm, Patient Position: Sitting, Cuff Size: Normal)   Pulse 65   Ht 6\' 1"  (1.854 m)   Wt 221 lb (100.2 kg)   BMI 29.16 kg/m  BMI: Body mass index is 29.16 kg/m.  Physical Exam  Constitutional: He is oriented to person, place, and time. He appears well-developed  and well-nourished.  HENT:  Head: Normocephalic and atraumatic.  Eyes: Right eye exhibits no discharge. Left eye exhibits no discharge.  Neck: Normal range of motion. No JVD present.  Cardiovascular: Normal rate, regular rhythm, S1 normal, S2 normal and normal heart sounds.  Exam reveals no distant heart sounds, no friction rub, no midsystolic click and no opening snap.   No murmur heard. Right radial cath site without bleeding, bruising, swelling, erythema, or tenderness to palpation. Well-healed. Right radial pulse 2+.  Pulmonary/Chest: Effort normal and breath sounds normal. No respiratory distress. He has no decreased breath sounds. He has no wheezes. He has no rales. He exhibits no tenderness.  Abdominal: Soft. He exhibits no distension. There is no tenderness.  Musculoskeletal: He exhibits no edema.  Neurological: He is alert and oriented to person, place, and time.  Skin:  Skin is warm and dry. No cyanosis. Nails show no clubbing.  Psychiatric: He has a normal mood and affect. His speech is normal and behavior is normal. Judgment and thought content normal.     EKG:  Was ordered and interpreted by me today. Shows NSR, 65 bpm, left anterior fascicular block, prior inferior infarct, early repolarization, inferolateral TWI (old)  Recent Labs: 12/05/2016: Magnesium 1.8 12/06/2016: ALT 32 12/23/2016: BUN 18; Creatinine, Ser 1.07; Hemoglobin 15.9; Platelets 232; Potassium 3.8; Sodium 138  12/06/2016: Cholesterol 197; HDL 33; LDL Cholesterol 130; Total CHOL/HDL Ratio 6.0; Triglycerides 172; VLDL 34   Estimated Creatinine Clearance: 90.2 mL/min (by C-G formula based on SCr of 1.07 mg/dL).   Wt Readings from Last 3 Encounters:  01/07/17 221 lb (100.2 kg)  12/23/16 225 lb (102.1 kg)  12/14/16 225 lb 4 oz (102.2 kg)     Other studies reviewed: Additional studies/records reviewed today include: summarized above  ASSESSMENT AND PLAN:  1. CAD in native vessel s/p recent inferior ST elevation MI: No symptoms concerning for angina. Continue dual antiplatelet therapy with aspirin 81 mg daily and Brilinta 90 mg twice a day for at least the next 12 months. Continue carvedilol 3.125 mg twice a day, lisinopril 10 mg daily, and Lipitor 40 mg daily. Cardiac rehabilitation. No further plans for ischemic evaluation at this time.  2. Systolic dysfunction/SOB: He does not appear volume overloaded. Given shortness of breath and elevated LVEDP noted on prior cardiac catheterization will start Lasix 20 mg daily. Will check renal function today through [facility] and again in 1 week to evaluate for stable renal function and potassium. Recheck echo in 2 weeks to evaluate for improvement in EF s/p PCI as above. Healthy diet advised. Continue carvedilol and lisinopril as above.  3. Family history of premature CAD: As above. Risk factor modification. Healthy lifestyle.   4. HTN: Well-controlled.  Continue current medications as above.  5. HLD: Lipitor 40 mg daily. Check fasting lipid panel and cmet.   6. Gout: Followed by PCP.  Disposition: F/u with me in 3 months.  Current medicines are reviewed at length with the patient today.  The patient did not have any concerns regarding medicines.  Elinor Dodge PA-C 01/07/2017 8:43 AM     [facility] [place] [address] [place], [place] [address] [phone]

## 2017-01-07 ENCOUNTER — Ambulatory Visit (INDEPENDENT_AMBULATORY_CARE_PROVIDER_SITE_OTHER): Payer: 59 | Admitting: Physician Assistant

## 2017-01-07 ENCOUNTER — Ambulatory Visit: Payer: 59 | Admitting: Physician Assistant

## 2017-01-07 ENCOUNTER — Encounter: Payer: Self-pay | Admitting: Physician Assistant

## 2017-01-07 ENCOUNTER — Encounter: Payer: Self-pay | Admitting: *Deleted

## 2017-01-07 VITALS — BP 110/80 | HR 65 | Ht 73.0 in | Wt 221.0 lb

## 2017-01-07 DIAGNOSIS — Z8249 Family history of ischemic heart disease and other diseases of the circulatory system: Secondary | ICD-10-CM | POA: Diagnosis not present

## 2017-01-07 DIAGNOSIS — I2510 Atherosclerotic heart disease of native coronary artery without angina pectoris: Secondary | ICD-10-CM | POA: Diagnosis not present

## 2017-01-07 DIAGNOSIS — E782 Mixed hyperlipidemia: Secondary | ICD-10-CM | POA: Diagnosis not present

## 2017-01-07 DIAGNOSIS — I519 Heart disease, unspecified: Secondary | ICD-10-CM

## 2017-01-07 DIAGNOSIS — Z955 Presence of coronary angioplasty implant and graft: Secondary | ICD-10-CM

## 2017-01-07 DIAGNOSIS — I10 Essential (primary) hypertension: Secondary | ICD-10-CM | POA: Diagnosis not present

## 2017-01-07 MED ORDER — FUROSEMIDE 20 MG PO TABS: 20.0000 mg | ORAL_TABLET | Freq: Every day | ORAL | 3 refills | Status: DC

## 2017-01-07 NOTE — Patient Instructions (Signed)
Medication Instructions:  Your physician has recommended you make the following change in your medication:  1- START taking Furosemide 20 mg (1 tablet) by mouth once a day.    Labwork: Your physician recommends that you return for lab work in: TODAY at [facility] (CBC, FASTING LIPID PROFILE, CBC). YOU WILL NEED TO BE FASTING, DO NOT EAT OR DRINK AFTER MIDNIGHT THE DAY OF LAB WORK.  Your physician recommends that you return for lab work in: 1 WEEK (BMP).  - Please have them fax results to [phone].   Testing/Procedures: Your physician has requested that you have an echocardiogram. Echocardiography is a painless test that uses sound waves to create images of your heart. It provides your doctor with information about the size and shape of your heart and how well your heart's chambers and valves are working. This procedure takes approximately one hour. There are no restrictions for this procedure.    Follow-Up: You have been referred to Cardiac Rehab. The referral is in the system, if you have not received a call please call [phone] to arrange appointments.  Your physician recommends that you schedule a follow-up appointment in: 3 MONTHS WITH DR ARIDA OR Eula ListenYAN DUNN.  If you need a refill on your cardiac medications before your next appointment, please call your pharmacy.   Echocardiogram An echocardiogram, or echocardiography, uses sound waves (ultrasound) to produce an image of your heart. The echocardiogram is simple, painless, obtained within a short period of time, and offers valuable information to your health care provider. The images from an echocardiogram can provide information such as:  Evidence of coronary artery disease (CAD).  Heart size.  Heart muscle function.  Heart valve function.  Aneurysm detection.  Evidence of a past heart attack.  Fluid buildup around the heart.  Heart muscle thickening.  Assess heart valve function.  Tell a health care provider  about:  Any allergies you have.  All medicines you are taking, including vitamins, herbs, eye drops, creams, and over-the-counter medicines.  Any problems you or family members have had with anesthetic medicines.  Any blood disorders you have.  Any surgeries you have had.  Any medical conditions you have.  Whether you are pregnant or may be pregnant. What happens before the procedure? No special preparation is needed. Eat and drink normally. What happens during the procedure?  In order to produce an image of your heart, gel will be applied to your chest and a wand-like tool (transducer) will be moved over your chest. The gel will help transmit the sound waves from the transducer. The sound waves will harmlessly bounce off your heart to allow the heart images to be captured in real-time motion. These images will then be recorded.  You may need an IV to receive a medicine that improves the quality of the pictures. What happens after the procedure? You may return to your normal schedule including diet, activities, and medicines, unless your health care provider tells you otherwise. This information is not intended to replace advice given to you by your health care provider. Make sure you discuss any questions you have with your health care provider. Document Released: 05/22/2000 Document Revised: 01/11/2016 Document Reviewed: 01/30/2013 Elsevier Interactive Patient Education  2017 [employer]

## 2017-01-09 LAB — COMPREHENSIVE METABOLIC PANEL
ALK PHOS: 86 IU/L (ref 39–117)
ALT: 18 IU/L (ref 0–44)
AST: 17 IU/L (ref 0–40)
Albumin/Globulin Ratio: 1.6 (ref 1.2–2.2)
Albumin: 4.3 g/dL (ref 3.6–4.8)
BUN/Creatinine Ratio: 14 (ref 10–24)
BUN: 13 mg/dL (ref 8–27)
Bilirubin Total: 0.5 mg/dL (ref 0.0–1.2)
CALCIUM: 9.1 mg/dL (ref 8.6–10.2)
CO2: 21 mmol/L (ref 20–29)
CREATININE: 0.96 mg/dL (ref 0.76–1.27)
Chloride: 105 mmol/L (ref 96–106)
GFR calc Af Amer: 98 mL/min/{1.73_m2} (ref >59)
GFR, EST NON AFRICAN AMERICAN: 85 mL/min/{1.73_m2} (ref >59)
Globulin, Total: 2.7 g/dL (ref 1.5–4.5)
Glucose: 85 mg/dL (ref 65–99)
POTASSIUM: 4.2 mmol/L (ref 3.5–5.2)
Sodium: 144 mmol/L (ref 134–144)
Total Protein: 7 g/dL (ref 6.0–8.5)

## 2017-01-09 LAB — CBC WITH DIFFERENTIAL/PLATELET
BASOS: 0 %
Basophils Absolute: 0 10*3/uL (ref 0.0–0.2)
EOS (ABSOLUTE): 0.2 10*3/uL (ref 0.0–0.4)
EOS: 3 %
Hematocrit: 44.4 % (ref 37.5–51.0)
Hemoglobin: 15.6 g/dL (ref 13.0–17.7)
IMMATURE GRANS (ABS): 0 10*3/uL (ref 0.0–0.1)
IMMATURE GRANULOCYTES: 0 %
LYMPHS: 30 %
Lymphocytes Absolute: 1.8 10*3/uL (ref 0.7–3.1)
MCH: 32.1 pg (ref 26.6–33.0)
MCHC: 35.1 g/dL (ref 31.5–35.7)
MCV: 91 fL (ref 79–97)
MONOS ABS: 0.5 10*3/uL (ref 0.1–0.9)
Monocytes: 8 %
NEUTROS PCT: 59 %
Neutrophils Absolute: 3.4 10*3/uL (ref 1.4–7.0)
PLATELETS: 209 10*3/uL (ref 150–379)
RBC: 4.86 x10E6/uL (ref 4.14–5.80)
RDW: 14 % (ref 12.3–15.4)
WBC: 5.8 10*3/uL (ref 3.4–10.8)

## 2017-01-09 LAB — LIPID PANEL
CHOL/HDL RATIO: 4 ratio (ref 0.0–5.0)
Cholesterol, Total: 174 mg/dL (ref 100–199)
HDL: 44 mg/dL (ref >39)
LDL CALC: 102 mg/dL — AB (ref 0–99)
Triglycerides: 138 mg/dL (ref 0–149)
VLDL CHOLESTEROL CAL: 28 mg/dL (ref 5–40)

## 2017-01-11 ENCOUNTER — Other Ambulatory Visit: Payer: Self-pay

## 2017-01-11 ENCOUNTER — Telehealth: Payer: Self-pay | Admitting: Physician Assistant

## 2017-01-11 ENCOUNTER — Other Ambulatory Visit: Payer: Self-pay | Admitting: Physician Assistant

## 2017-01-11 ENCOUNTER — Ambulatory Visit (INDEPENDENT_AMBULATORY_CARE_PROVIDER_SITE_OTHER): Payer: 59

## 2017-01-11 DIAGNOSIS — I519 Heart disease, unspecified: Secondary | ICD-10-CM

## 2017-01-11 DIAGNOSIS — E782 Mixed hyperlipidemia: Secondary | ICD-10-CM

## 2017-01-11 DIAGNOSIS — I2510 Atherosclerotic heart disease of native coronary artery without angina pectoris: Secondary | ICD-10-CM | POA: Diagnosis not present

## 2017-01-11 DIAGNOSIS — Z955 Presence of coronary angioplasty implant and graft: Secondary | ICD-10-CM

## 2017-01-11 DIAGNOSIS — I340 Nonrheumatic mitral (valve) insufficiency: Secondary | ICD-10-CM

## 2017-01-11 NOTE — Telephone Encounter (Signed)
Letter placed at front desk for patient to get when echo completed.

## 2017-01-11 NOTE — Telephone Encounter (Signed)
Letter typed, printed, signed, and on RN desk.

## 2017-01-11 NOTE — Telephone Encounter (Signed)
Is it ok for this patient to return to work? Any restrictions?

## 2017-01-11 NOTE — Telephone Encounter (Signed)
Pt came in for ECHO today Pt needs clearance to go back to work dated for today

## 2017-01-12 ENCOUNTER — Other Ambulatory Visit: Payer: Self-pay

## 2017-01-12 DIAGNOSIS — I519 Heart disease, unspecified: Secondary | ICD-10-CM

## 2017-01-15 NOTE — Telephone Encounter (Signed)
Received form from [employer] placed in nurse box. [facility]

## 2017-01-18 ENCOUNTER — Telehealth: Payer: Self-pay | Admitting: Physician Assistant

## 2017-01-18 NOTE — Telephone Encounter (Signed)
Patient FMLA/Disability Form placed in [employer]'s in basket.

## 2017-01-19 LAB — BASIC METABOLIC PANEL
BUN / CREAT RATIO: 14 (ref 10–24)
BUN: 15 mg/dL (ref 8–27)
CALCIUM: 9.5 mg/dL (ref 8.6–10.2)
CO2: 23 mmol/L (ref 20–29)
CREATININE: 1.04 mg/dL (ref 0.76–1.27)
Chloride: 106 mmol/L (ref 96–106)
GFR, EST AFRICAN AMERICAN: 89 mL/min/{1.73_m2} (ref >59)
GFR, EST NON AFRICAN AMERICAN: 77 mL/min/{1.73_m2} (ref >59)
Glucose: 78 mg/dL (ref 65–99)
Potassium: 4.1 mmol/L (ref 3.5–5.2)
Sodium: 145 mmol/L — ABNORMAL HIGH (ref 134–144)

## 2017-01-20 NOTE — Telephone Encounter (Signed)
Received forms from [employer]  Faxed to angel at [employer] fwd original via interoffice mail.

## 2017-01-25 ENCOUNTER — Ambulatory Visit (INDEPENDENT_AMBULATORY_CARE_PROVIDER_SITE_OTHER): Payer: 59 | Admitting: Physician Assistant

## 2017-01-25 ENCOUNTER — Other Ambulatory Visit
Admission: RE | Admit: 2017-01-25 | Discharge: 2017-01-25 | Disposition: A | Discharge status: Home / Self Care | Payer: 59 | Source: Ambulatory Visit | Attending: Physician Assistant | Admitting: Physician Assistant

## 2017-01-25 ENCOUNTER — Telehealth: Payer: Self-pay | Admitting: Cardiovascular Disease

## 2017-01-25 ENCOUNTER — Encounter: Payer: Self-pay | Admitting: Physician Assistant

## 2017-01-25 VITALS — BP 120/90 | HR 82 | Ht 73.0 in | Wt 220.8 lb

## 2017-01-25 DIAGNOSIS — M25512 Pain in left shoulder: Secondary | ICD-10-CM

## 2017-01-25 DIAGNOSIS — Z955 Presence of coronary angioplasty implant and graft: Secondary | ICD-10-CM

## 2017-01-25 DIAGNOSIS — Z8249 Family history of ischemic heart disease and other diseases of the circulatory system: Secondary | ICD-10-CM

## 2017-01-25 DIAGNOSIS — I2510 Atherosclerotic heart disease of native coronary artery without angina pectoris: Secondary | ICD-10-CM

## 2017-01-25 DIAGNOSIS — M79602 Pain in left arm: Secondary | ICD-10-CM

## 2017-01-25 DIAGNOSIS — I10 Essential (primary) hypertension: Secondary | ICD-10-CM | POA: Diagnosis not present

## 2017-01-25 DIAGNOSIS — I519 Heart disease, unspecified: Secondary | ICD-10-CM | POA: Diagnosis not present

## 2017-01-25 DIAGNOSIS — E782 Mixed hyperlipidemia: Secondary | ICD-10-CM

## 2017-01-25 LAB — TROPONIN I: Troponin I: <0.03 ng/mL (ref <0.03)

## 2017-01-25 NOTE — Progress Notes (Signed)
Cardiology Office Note Date:  01/25/2017  Patient ID:  Jeff Warren 21-Jun-1955, MRN [id number] PCP:  [name], MD  Cardiologist:  [name], MD    Chief Complaint: Left arm pain  History of Present Illness: Jeff Warren is a 61 y.o. male with history of recently diagnosed CAD in the setting of an inferior ST elevation MI on 12/05/2016 s/p PCI/DES to the distal RCA, systolic dysfunction, family history of premature CAD with his sister dying from an MI at age 23, HTN, gout, and BPH who presents for evaluation of left shoulder/arm pain.   Prior to the above admission in June 2018, he did not have any previously known cardiac history. Around 2:30 AM on 6/30 he developed sudden chest pain while at work [employer]). Pain was aching and a heavniess located along the left side of the chest with associated nausea, disphoresis, SOB, dizziness, and lightheadedness. His initial EKG was not diagnostic for inferior ST elevation MI given that there was only 1 mm of elevation in one lead. Subsequent EKG showed inferior ST elevation with reciprocal ST depression in aVL. He was given ASA and heparin, and was taken emergently to the cardiac cath lab that showed a thrombotic occlusion of the distal RCA that was treated successfully with PCI and DES. There was also residual significant stenosis in the proximal to mid LAD estimated at 80%. There was mild 30% stenosis of the mid to distal RCA and an occluded D1 with right-to-left collaterals. EF was mildly reduced by LV gram at 45-50%. Severely elevated LVEDP. Post intervention echo showed an EF of 50-55%, HK of the inferior myocardium, GR1DD, mildly dilated left atrium. He was placed on DAPT with ASA 81 mg daily and Brilinta 90 mg bid. In follow up on 7/9, he was doing well. He was scheduled for staged PCI on 12/23/16 and underwent successful PCI/DES to the proximal to mid LAD with a Svelte (integrated delivery system as part of the [employer] research study, 3.0 x 23 mm)  with 0% residual stenosis. There was a small diagonal branch which was jailed by the stent with loss of flow; however, the branch was known to be sub-totally occluded with right-to-left collaterals. Post cath labs were stable. In hospital follow up on 8/2, he was doing well, tolerating all medications without missing any doses, including DAPT. Recheck limited echo on 8/6 showed improved LV systolic function with an EF of 55-60%, no RWMA, GR1DD, tirival AI, mild MR, mildly dilated left atirum. He was released back to full duty at [employer]. Patient came to the office on 8/20 with complaints of left arm pain starting in the shoulder and radiating down the arm that has been intermittent since 01/22/17. He was asymptomatic at the time he brought this to our attention on the morning of 8/20. Appointment was scheduled for this afternoon.   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 Pain would last ~ 2-3 minutes and self resolve. Pain was not worse with ROM and he denies any trauma/injury to the arm/shoulder. Pain was somewhat similar to prior MI pain, though not as severe or long lasting. On 8/18, pain was more frequent, coming on several times per hour, though again, lasting 2-3 minutes and self resolving. On 8/19 he did not have any further shoulder/arm pain, though reports he just did not feel well. Never with chest pain, SOB, diaphoresis, presyncope, or syncope. Mild  dizziness that was short lived on 8/19. Has not had any further pain since 8/19, and feels well currently, stating, "I feel normal." He has not missed any doses of his DAPT. He reports he feels like he just got over stressed at work and with calming down, he is feeling better.    Past Medical History:  Diagnosis Date  . BPH (benign prostatic hyperplasia)   . CAD (coronary artery disease)    a. inferior ST elevation MI 12/05/16: cardiac cath LM nl,  p-mLAD 80%, D1 CTO with right-to-left collats, LCx minor irregs,  m-dRCA 30%, dRCA 100% s/p PCI/DES, EF 45-50%, severely elevated LVEDP; b. staged PCI 12/23/16 - successful PCI/DES to p-mLAD with small diag being jailed by stent (vessel known to be subtotally occluded with R-L collats)  . Family history of premature CAD    a. sister passed from MI at age 89  . Gout   . HLD (hyperlipidemia)   . Hypertension   . Systolic dysfunction    a. TTE 12/05/16: EF 50-55%, inf HK, GR1DD, mildly dilated LA    Past Surgical History:  Procedure Laterality Date  . ABDOMINOPLASTY    . COLONOSCOPY WITH PROPOFOL N/A 06/29/2016   Procedure: COLONOSCOPY WITH PROPOFOL;  Surgeon: Charolett Bumpers, MD;  Location: [facility];  Service: Endoscopy;  Laterality: N/A;  . CORONARY STENT INTERVENTION N/A 12/23/2016   Procedure: Coronary Stent Intervention;  Surgeon: Iran Ouch, MD;  Location: [facility];  Service: Cardiovascular;  Laterality: N/A;  . CORONARY/GRAFT ACUTE MI REVASCULARIZATION N/A 12/05/2016   Procedure: Coronary/Graft Acute MI Revascularization;  Surgeon: Iran Ouch, MD;  Location: [facility]AS[facility] CV [facility];  Service: Cardiovascular;  Laterality: N/A;  . ganglion cyst removal Right   . LEFT HEART CATH AND CORONARY ANGIOGRAPHY N/A 12/05/2016   Procedure: Left Heart Cath and Coronary Angiography;  Surgeon: Iran Ouch, MD;  Location: [facility] INVASIVE CV LAB;  Service: Cardiovascular;  Laterality: N/A;  . NO PAST SURGERIES     Has has some cosmetic surgeries    Current Meds  Medication Sig  . aspirin 81 MG chewable tablet Chew 1 tablet (81 mg total) by mouth daily.  Marland Kitchen atorvastatin (LIPITOR) 40 MG tablet Take 1 tablet (40 mg total) by mouth daily. (Patient taking differently: Take 40 mg by mouth daily at 6 PM. )  . carvedilol (COREG) 3.125 MG tablet Take 1 tablet (3.125 mg total) by mouth 2 (two) times daily.  . colchicine 0.6 MG tablet Take 0.6 mg by mouth 2 (two) times daily as needed  (gout).  . furosemide (LASIX) 20 MG tablet Take 1 tablet (20 mg total) by mouth daily.  Marland Kitchen lisinopril (PRINIVIL,ZESTRIL) 10 MG tablet Take 1 tablet (10 mg total) by mouth daily.  . pantoprazole (PROTONIX) 40 MG tablet Take 1 tablet (40 mg total) by mouth daily as needed. (Patient taking differently: Take 40 mg by mouth daily as needed (acid reflux). )  . tamsulosin (FLOMAX) 0.4 MG CAPS capsule Take 0.4 mg by mouth daily.  . ticagrelor (BRILINTA) 90 MG TABS tablet Take 1 tablet (90 mg total) by mouth 2 (two) times daily.    Allergies:   Bextra [valdecoxib]   Social History:  The patient  reports that he has never smoked. He has never used smokeless tobacco. He reports that he does not drink alcohol or use drugs.   Family History:  The patient's family history includes Cancer in his father; Diabetes Mellitus II in his brother, mother, and  sister; Kidney failure in his brother.  ROS:   Review of Systems  Constitutional: Negative for chills, diaphoresis, fever, malaise/fatigue and weight loss.  HENT: Negative for congestion.   Eyes: Negative for discharge and redness.  Respiratory: Negative for cough, hemoptysis, sputum production, shortness of breath and wheezing.   Cardiovascular: Negative for chest pain, palpitations, orthopnea, claudication, leg swelling and PND.  Gastrointestinal: Negative for abdominal pain, blood in stool, heartburn, melena, nausea and vomiting.  Genitourinary: Negative for hematuria.  Musculoskeletal: Positive for joint pain. Negative for falls and myalgias.  Skin: Negative for rash.  Neurological: Positive for dizziness. Negative for tingling, tremors, sensory change, speech change, focal weakness, loss of consciousness and weakness.  Endo/Heme/Allergies: Does not bruise/bleed easily.  Psychiatric/Behavioral: Negative for substance abuse. The patient is not nervous/anxious.   All other systems reviewed and are negative.    PHYSICAL EXAM:  VS:  BP 120/90 (BP  Location: Left Arm, Patient Position: Sitting, Cuff Size: Normal)   Pulse 82   Ht 6\' 1"  (1.854 m)   Wt 220 lb 12 oz (100.1 kg)   BMI 29.12 kg/m  BMI: Body mass index is 29.12 kg/m.  Physical Exam  Constitutional: He is oriented to person, place, and time. He appears well-developed and well-nourished.  HENT:  Head: Normocephalic and atraumatic.  Eyes: Right eye exhibits no discharge. Left eye exhibits no discharge.  Neck: Normal range of motion. No JVD present.  Cardiovascular: Normal rate, regular rhythm, S1 normal, S2 normal and normal heart sounds.  Exam reveals no distant heart sounds, no friction rub, no midsystolic click and no opening snap.   No murmur heard. Pulmonary/Chest: Effort normal and breath sounds normal. No respiratory distress. He has no decreased breath sounds. He has no wheezes. He has no rales. He exhibits no tenderness.  Abdominal: Soft. He exhibits no distension. There is no tenderness.  Musculoskeletal: He exhibits no edema.  FROM of left shoulder/arm without pain.   Neurological: He is alert and oriented to person, place, and time.  Skin: Skin is warm and dry. No cyanosis. Nails show no clubbing.  Psychiatric: He has a normal mood and affect. His speech is normal and behavior is normal. Judgment and thought content normal.     EKG:  Was ordered and interpreted by me today. Shows NSR, 82 bpm, left axis deviation, left anterior fascicular block, prior inferior infarct, poor R wave progression (no changes from prior study)   Recent Labs: 12/05/2016: Magnesium 1.8 01/08/2017: ALT 18; Hemoglobin 15.6; Platelets 209 01/18/2017: BUN 15; Creatinine, Ser 1.04; Potassium 4.1; Sodium 145  12/06/2016: VLDL 34 01/08/2017: Chol/HDL Ratio 4.0; Cholesterol, Total 174; HDL 44; LDL Calculated 102; Triglycerides 138   Estimated Creatinine Clearance: 92.8 mL/min (by C-G formula based on SCr of 1.04 mg/dL).   Wt Readings from Last 3 Encounters:  01/25/17 220 lb 12 oz (100.1 kg)    01/07/17 221 lb (100.2 kg)  12/23/16 225 lb (102.1 kg)     Other studies reviewed: Additional studies/records reviewed today include: summarized above  ASSESSMENT AND PLAN:  1. Left arm pain: Currently resolved. Pain felt similar to prior MI pain, though not as severe and not as long lasting. Currently, pain free. Given symptoms felt somewhat similar to prior MI, we will check a stat troponin this afternoon. Given duration of pain lasting only 2-3 minutes and unchanged EKG I feel like this is less likely to be ACS at this time. Given he has been symptom-free since 8/19, we can rule him  out with one troponin. If troponin is positive, he will need to proceed to the ED. If negative, consider decreasing stress. Results will come to RN Chestine Spore who will review with [name] as I will be out of the office for the remainder of the day without computer access. Could consider further medical management if he has recurrent symptoms.  2. CAD in native artery: As above. Continue DAPT with ASA 81 mg daily and Brilitna 90 mg bid. Continue Coreg 3.125 mg bid and Lipitor. Cardiac rehab.   3. Systolic dysfunction: EF normalized by recent echo. He does not appear to be volume overloaded at this time. Continue Coreg, lisinopril, and Lasix.   4. Family history of premature CAD: As above.  5. HTN: Well controlled. Continue current medications.   6. HLD: Lipitor.   Disposition: F/u with [name], MD in 3 months.   Current medicines are reviewed at length with the patient today.  The patient did not have any concerns regarding medicines.  Elinor Dodge PA-C 01/25/2017 2:34 PM     [facility] [address] [place], [place] [address] [phone]

## 2017-01-25 NOTE — Telephone Encounter (Signed)
Friday patient developed left arm pain starting in th shoulder    Sunday early morning patient started feeling lightheaded slightly nauseated  and still left arm pain   Patient works third shift and states he doesn't think the cause of sx is medication   Patient says he isn't having pain this morning and states he may have done too much at work    Patient doesn't know if he should be seen soon .  He was told to let someone know if he was having issues.

## 2017-01-25 NOTE — Telephone Encounter (Signed)
Patient came into office this morning.  He c/o left arm pain starting in the shoulder and going down the arm which occurred starting last Friday. He is asymptomatic this morning but wanted to make [place] aware. It first started on Friday early morning at the end of his night shift. He had it on and off on Saturday morning between 0800 and 1030 occurring about 2-3 times an hour, lasting 1-2 minutes at a time. Early Sunday morning around 0400, he had the left arm pain accompanied by lightheadedness and nausea. This morning he has not had any further symptoms. He works night shift at [employer] and states he has a very stressful job at time. He wonders if he needed to take it more easy and if he needed more adequate sleep. Eula Listen, PA-C has appt today at 2:30 pm. Patient agreed to come to that appt. Pt verbalized understanding to call 911 or go to the emergency room, if he develops any new or worsening symptoms. Will route to [facility] for further advice and if appt is necessary and let patient know accordingly.

## 2017-01-25 NOTE — Patient Instructions (Signed)
Medication Instructions:  Your physician recommends that you continue on your current medications as directed. Please refer to the Current Medication list given to you today.   Labwork: Your physician recommends that you return for lab work in: NOW AT [facility]ICAL [facility]ALL. ([facility]). - Please go to the [facility]. You will check in at the front desk to the right as you walk into the atrium. Valet Parking is offered if needed.    Testing/Procedures: NONE  Follow-Up: Your physician recommends that you schedule a follow-up appointment in: 3 MONTHS WITH DR ARIDA.   If you need a refill on your cardiac medications before your next appointment, please call your pharmacy.

## 2017-01-27 ENCOUNTER — Telehealth: Payer: Self-pay | Admitting: *Deleted

## 2017-02-04 NOTE — Telephone Encounter (Signed)
Pt seen in office 01/25/17.

## 2017-02-12 ENCOUNTER — Telehealth: Payer: Self-pay | Admitting: *Deleted

## 2017-02-12 NOTE — Telephone Encounter (Signed)
Open in error

## 2017-03-11 ENCOUNTER — Ambulatory Visit: Payer: 59 | Admitting: Physician Assistant

## 2017-03-29 ENCOUNTER — Other Ambulatory Visit: Payer: Self-pay | Admitting: Physician Assistant

## 2017-03-30 LAB — LIPID PANEL W/O CHOL/HDL RATIO
Cholesterol, Total: 180 mg/dL (ref 100–199)
HDL: 42 mg/dL (ref >39)
LDL Calculated: 110 mg/dL — ABNORMAL HIGH (ref 0–99)
TRIGLYCERIDES: 138 mg/dL (ref 0–149)
VLDL Cholesterol Cal: 28 mg/dL (ref 5–40)

## 2017-04-08 NOTE — Progress Notes (Signed)
Letter mailed

## 2017-04-09 ENCOUNTER — Telehealth: Payer: Self-pay | Admitting: Cardiovascular Disease

## 2017-04-09 ENCOUNTER — Ambulatory Visit: Payer: 59 | Admitting: Cardiovascular Disease

## 2017-04-09 NOTE — Telephone Encounter (Signed)
Per Dr. Kirke CorinArida, will need to reschedule pt's appt that was scheduled today. Left message on home phone, [phone]. Called cell number and s/w Melvenia BeamSimon (ok per DPR) who states pt's cell number is [phone]. Pt works third shift and is not yet home. Pt does not have cell phone with him. Explained need to reschedule appt w/Simon who will relay message to patient. He should call back to reschedule and review lab results. Cell number updated.

## 2017-06-09 ENCOUNTER — Encounter: Payer: Self-pay | Admitting: *Deleted

## 2017-06-09 DIAGNOSIS — Z006 Encounter for examination for normal comparison and control in clinical research program: Secondary | ICD-10-CM

## 2017-06-09 NOTE — Progress Notes (Signed)
[employer] research study 6 month telephone call completed. Patient states he has been compliant with medication and has had no changes. He denies chest pain or any other adverse events. Next research required visit (12 month) window is18/jun/2019 - 17/aug/2019, an EKG will be required.  I thanked the patient for his participation and encouraged him to call research office if he had any questions or issues.

## 2017-06-18 ENCOUNTER — Ambulatory Visit: Payer: 59 | Admitting: Cardiovascular Disease

## 2017-07-08 ENCOUNTER — Ambulatory Visit: Payer: 59 | Admitting: Cardiovascular Disease

## 2017-07-08 ENCOUNTER — Encounter: Payer: Self-pay | Admitting: Cardiovascular Disease

## 2017-07-08 VITALS — BP 136/82 | HR 83 | Ht 73.0 in | Wt 238.8 lb

## 2017-07-08 DIAGNOSIS — I2510 Atherosclerotic heart disease of native coronary artery without angina pectoris: Secondary | ICD-10-CM | POA: Diagnosis not present

## 2017-07-08 DIAGNOSIS — Z79899 Other long term (current) drug therapy: Secondary | ICD-10-CM

## 2017-07-08 DIAGNOSIS — I10 Essential (primary) hypertension: Secondary | ICD-10-CM | POA: Diagnosis not present

## 2017-07-08 DIAGNOSIS — E782 Mixed hyperlipidemia: Secondary | ICD-10-CM | POA: Diagnosis not present

## 2017-07-08 MED ORDER — ROSUVASTATIN CALCIUM 40 MG PO TABS: 40.0000 mg | ORAL_TABLET | Freq: Every day | ORAL | 3 refills | Status: DC

## 2017-07-08 MED ORDER — EZETIMIBE 10 MG PO TABS: 10.0000 mg | ORAL_TABLET | Freq: Every day | ORAL | 3 refills | Status: DC

## 2017-07-08 MED ORDER — ROSUVASTATIN CALCIUM 40 MG PO TABS: 40.0000 mg | ORAL_TABLET | Freq: Every day | ORAL | 0 refills | Status: DC

## 2017-07-08 MED ORDER — EZETIMIBE 10 MG PO TABS: 10.0000 mg | ORAL_TABLET | Freq: Every day | ORAL | 0 refills | Status: DC

## 2017-07-08 NOTE — Patient Instructions (Signed)
Medication Instructions:  Your physician has recommended you make the following change in your medication:  1- STOP Atorvastatin. 2- START Rosuvastatin 40 mg (1 tablet) by mouth once a day. 3- START Zetia 10 mg (1 tablet) by mouth once a day.   Labwork: Your physician recommends that you return for lab work in: 6 WEEKS (APPROX. August 19, 2017)  - You will need to be FASTING.  - [facility]. You will check in at the front desk to the right as you walk into the atrium. Valet Parking is offered if needed. - NO APPOINTMENT NEEDED.    Testing/Procedures: none  Follow-Up: Your physician wants you to follow-up in: 6 MONTHS WITH DR ARIDA.  You will receive a reminder letter in the mail two months in advance. If you don't receive a letter, please call our office to schedule the follow-up appointment.   Any Other Special Instructions Will Be Listed Below (If Applicable).     If you need a refill on your cardiac medications before your next appointment, please call your pharmacy.

## 2017-07-08 NOTE — Progress Notes (Signed)
Cardiology Office Note   Date:  07/08/2017   ID:  Jeff Warren, DOB [birth year], MRN [id number]  PCP:  [name], MD  Cardiologist:   [name], MD   Chief Complaint  Patient presents with  . Other    3 month follow up. Patient denies chest pain and SOB. Meds reviewed vebally with patient.       History of Present Illness: Jeff Warren is a 62 y.o. male who presents for a follow-up visit regarding coronary artery disease.  He had inferior ST elevation myocardial infarction in June 2018.  Emergent cardiac catheterization showed occluded distal right coronary artery which was treated successfully with PCI and drug-eluting stent placement.  There was 80% proximal LAD stenosis but that was staged and treated with PCI in July.  He has known history of severe hyperlipidemia with intolerance to higher dose of atorvastatin. He has been doing extremely well and denies any chest pain palpitations.  He continues to work at [employer] and reports significant stress.    Past Medical History:  Diagnosis Date  . BPH (benign prostatic hyperplasia)   . CAD (coronary artery disease)    a. inferior ST elevation MI 12/05/16: cardiac cath LM nl, p-mLAD 80%, D1 CTO with right-to-left collats, LCx minor irregs,  m-dRCA 30%, dRCA 100% s/p PCI/DES, EF 45-50%, severely elevated LVEDP; b. staged PCI 12/23/16 - successful PCI/DES to p-mLAD with small diag being jailed by stent (vessel known to be subtotally occluded with R-L collats)  . Family history of premature CAD    a. sister passed from MI at age 79  . Gout   . HLD (hyperlipidemia)   . Hypertension   . Systolic dysfunction    a. TTE 12/05/16: EF 50-55%, inf HK, GR1DD, mildly dilated LA    Past Surgical History:  Procedure Laterality Date  . ABDOMINOPLASTY    . COLONOSCOPY WITH PROPOFOL N/A 06/29/2016   Procedure: COLONOSCOPY WITH PROPOFOL;  Surgeon: Charolett Bumpers, MD;  Location: [facility];  Service: Endoscopy;  Laterality: N/A;  . CORONARY  STENT INTERVENTION N/A 12/23/2016   Procedure: Coronary Stent Intervention;  Surgeon: Iran Ouch, MD;  [facility];  Service: Cardiovascular;  Laterality: N/A;  . CORONARY/GRAFT ACUTE MI REVASCULARIZATION N/A 12/05/2016   Procedure: Coronary/Graft Acute MI Revascularization;  Surgeon: Iran Ouch, MD;  Location: [facility] INVAS[facility] CV LAB;  Service: Cardiovascular;  Laterality: N/A;  . ganglion cyst removal Right   . LEFT HEART CATH AND CORONARY ANGIOGRAPHY N/A 12/05/2016   Procedure: Left Heart Cath and Coronary Angiography;  Surgeon: Iran Ouch, MD;  Location: [facility] INV[facility] CV [facility];  Service: Cardiovascular;  Laterality: N/A;  . NO PAST SURGERIES     Has has some cosmetic surgeries     Current Outpatient Medications  Medication Sig Dispense Refill  . aspirin 81 MG chewable tablet Chew 1 tablet (81 mg total) by mouth daily.    . colchicine 0.6 MG tablet Take 0.6 mg by mouth 2 (two) times daily as needed (gout).    Marland Kitchen lisinopril (PRINIVIL,ZESTRIL) 10 MG tablet Take 1 tablet (10 mg total) by mouth daily. 90 tablet 3  . pantoprazole (PROTONIX) 40 MG tablet Take 1 tablet (40 mg total) by mouth daily as needed. (Patient taking differently: Take 40 mg by mouth daily as needed (acid reflux). ) 30 tablet 1  . tamsulosin (FLOMAX) 0.4 MG CAPS capsule Take 0.4 mg by mouth daily.    . ticagrelor (BRILINTA) 90  MG TABS tablet Take 1 tablet (90 mg total) by mouth 2 (two) times daily. 180 tablet 3  . atorvastatin (LIPITOR) 40 MG tablet Take 1 tablet (40 mg total) by mouth daily. (Patient taking differently: Take 40 mg by mouth daily at 6 PM. ) 90 tablet 3  . carvedilol (COREG) 3.125 MG tablet Take 1 tablet (3.125 mg total) by mouth 2 (two) times daily. 180 tablet 3  . furosemide (LASIX) 20 MG tablet Take 1 tablet (20 mg total) by mouth daily. 90 tablet 3   No current facility-administered medications for this visit.     Allergies:   Bextra [valdecoxib]    Social History:  The  patient  reports that  has never smoked. he has never used smokeless tobacco. He reports that he does not drink alcohol or use drugs.   Family History:  The patient's family history includes Cancer in his father; Diabetes Mellitus II in his brother, mother, and sister; Kidney failure in his brother.    ROS:  Please see the history of present illness.   Otherwise, review of systems are positive for none.   All other systems are reviewed and negative.    PHYSICAL EXAM: VS:  BP 136/82 (BP Location: Left Arm, Patient Position: Sitting, Cuff Size: Large)   Pulse 83   Ht 6\' 1"  (1.854 m)   Wt 238 lb 12 oz (108.3 kg)   BMI 31.50 kg/m  , BMI Body mass index is 31.5 kg/m. GEN: Well nourished, well developed, in no acute distress  HEENT: normal  Neck: no JVD, carotid bruits, or masses Cardiac: RRR; no murmurs, rubs, or gallops,no edema  Respiratory:  clear to auscultation bilaterally, normal work of breathing GI: soft, nontender, nondistended, + BS MS: no deformity or atrophy  Skin: warm and dry, no rash Neuro:  Strength and sensation are intact Psych: euthymic mood, full affect   EKG:  EKG is ordered today. The ekg ordered today demonstrates normal sinus rhythm with left atrial enlargement, old inferior infarct.   Recent Labs: 12/05/2016: Magnesium 1.8 01/08/2017: ALT 18; Hemoglobin 15.6; Platelets 209 01/18/2017: BUN 15; Creatinine, Ser 1.04; Potassium 4.1; Sodium 145    Lipid Panel    Component Value Date/Time   CHOL 180 03/29/2017 1027   TRIG 138 03/29/2017 1027   HDL 42 03/29/2017 1027   CHOLHDL 4.0 01/08/2017 1035   CHOLHDL 6.0 12/06/2016 0546   VLDL 34 12/06/2016 0546   LDLCALC 110 (H) 03/29/2017 1027      Wt Readings from Last 3 Encounters:  07/08/17 238 lb 12 oz (108.3 kg)  01/25/17 220 lb 12 oz (100.1 kg)  01/07/17 221 lb (100.2 kg)       No flowsheet data found.    ASSESSMENT AND PLAN:  1.  Coronary artery disease involving native coronary arteries without  angina: He is overall doing very well with no anginal symptoms.  Continue medical therapy.  Continue dual antiplatelet therapy at least until July 2019 with preferably longer given ectasia in the right coronary artery.  2.  Previous ischemic cardiomyopathy: Most recent echocardiogram in August showed normal LV systolic function with no significant valvular abnormalities.  Continue small dose carvedilol and lisinopril.  3.  Hyperlipidemia: Currently on atorvastatin 40 mg once daily.  I reviewed most recent lipid profile done in October which showed an LDL of 110.  I elected to switch him to rosuvastatin 40 mg daily and add Zetia 10 mg daily.  Repeat lipid and liver profile in 6  weeks.  If LDL remains above 70, we should consider a PC SK 9 inhibitor.  He also needs to work on lifestyle changes and weight loss as it seems that he gained about 16-17 pounds last year.   Disposition:   FU with me in 6 months  Signed,  Lorine BearsMuhammad Gillis Boardley, MD  07/08/2017 2:55 PM    [facility]

## 2017-08-18 NOTE — Research (Signed)
OPTIMIZE protocol Labs Component     Latest Ref Rng & Units  12/23/2016 12/23/2016 12/23/2016          8:50 AM Pre-PCI  1:35 PM Post PCI  3:32 PM Discharge  Troponin I     <0.03 ng/mL  0.03 (HH) 0.03 (HH) 0.04 (HH)

## 2017-09-02 NOTE — Telephone Encounter (Addendum)
Late entry OPTIMIZE  30 DAY FOLLOW UP  PT NUMBER: [phone] PT INTIALS: JFA  DATE: 02/05/2017    Type of Visit: []  Office visit [x]  Telephone visit   Angina Status: [x]  asymptomatic / free of symptoms    []  silent ischemia    []  stable angina    []  unstable angina   If silent ischemia: []  EKG     []  Echo     []  ST-Holter     []  Treadmill stress test     []  Nuclear     []  Other  CCS classification: Choose an item.  [place] Classification: Choose an item.  UPDATE THE MEDICATION LOG WITH ANY CHANGES FROM THE NEXT 3 QUESITONS  Did the subject continue on the appropriate course of aspirin:  [x]  YES      []  NO  Did the subject continue on the appropriate course of antiplatelet therapy:  [x]  YES   [] NO  Did any of the subject's daily cardiovascular or antiplatelet medications change: [] YES    [x]  NO (If yes update the medication log)  Did any new adverse events occur since the last follow up:    [x]  YES   []  NO  (If yes complete an Adverse Event form)  Pt seen in office on 01/25/18 a troponin was drawn and negative.

## 2017-10-06 ENCOUNTER — Other Ambulatory Visit: Payer: Self-pay | Admitting: Cardiovascular Disease

## 2017-10-07 LAB — HEPATIC FUNCTION PANEL
ALT: 18 IU/L (ref 0–44)
AST: 20 IU/L (ref 0–40)
Albumin: 4.3 g/dL (ref 3.6–4.8)
Alkaline Phosphatase: 85 IU/L (ref 39–117)
BILIRUBIN, DIRECT: 0.13 mg/dL (ref 0.00–0.40)
Bilirubin Total: 0.6 mg/dL (ref 0.0–1.2)
TOTAL PROTEIN: 6.9 g/dL (ref 6.0–8.5)

## 2017-10-07 LAB — LIPID PANEL W/O CHOL/HDL RATIO
CHOLESTEROL TOTAL: 131 mg/dL (ref 100–199)
HDL: 40 mg/dL (ref >39)
LDL CALC: 65 mg/dL (ref 0–99)
TRIGLYCERIDES: 129 mg/dL (ref 0–149)
VLDL Cholesterol Cal: 26 mg/dL (ref 5–40)

## 2017-11-30 ENCOUNTER — Telehealth: Payer: Self-pay | Admitting: *Deleted

## 2017-11-30 NOTE — Telephone Encounter (Signed)
Left message for patient to call Research office to schedule 12 month follow up visit (exam/EKG) for the OPTIMIZE study.

## 2017-12-22 ENCOUNTER — Other Ambulatory Visit: Payer: Self-pay | Admitting: Physician Assistant

## 2017-12-28 ENCOUNTER — Other Ambulatory Visit: Payer: Self-pay

## 2017-12-28 ENCOUNTER — Encounter: Payer: 59 | Admitting: *Deleted

## 2017-12-28 VITALS — BP 148/80 | HR 80

## 2017-12-28 DIAGNOSIS — Z006 Encounter for examination for normal comparison and control in clinical research program: Secondary | ICD-10-CM

## 2018-02-15 ENCOUNTER — Other Ambulatory Visit: Payer: Self-pay | Admitting: Physician Assistant

## 2018-02-16 ENCOUNTER — Other Ambulatory Visit: Payer: Self-pay | Admitting: Physician Assistant

## 2018-02-18 ENCOUNTER — Telehealth: Payer: Self-pay | Admitting: Cardiovascular Disease

## 2018-02-18 NOTE — Telephone Encounter (Signed)
lmov to schedule appt with Dr. Kirke CorinArida

## 2018-02-18 NOTE — Telephone Encounter (Signed)
-----   Message from Margrett RudBrittany N Slayton, [place] sent at 02/17/2018  9:15 AM EDT ----- Regarding: patient needs an appointment  Can you please try to schedule an appointment for patient.   Thank you!

## 2018-02-22 NOTE — Telephone Encounter (Signed)
Lmov for patient to call and schedule appointment ° °

## 2018-02-25 NOTE — Telephone Encounter (Signed)
Lmov for patient to call and schedule appointment ° °

## 2018-03-03 NOTE — Telephone Encounter (Signed)
Scheduled

## 2018-03-09 ENCOUNTER — Ambulatory Visit (INDEPENDENT_AMBULATORY_CARE_PROVIDER_SITE_OTHER): Payer: 59

## 2018-03-09 ENCOUNTER — Other Ambulatory Visit: Payer: Self-pay

## 2018-03-09 DIAGNOSIS — I519 Heart disease, unspecified: Secondary | ICD-10-CM | POA: Diagnosis not present

## 2018-05-12 ENCOUNTER — Other Ambulatory Visit: Payer: Self-pay | Admitting: Cardiovascular Disease

## 2018-05-12 ENCOUNTER — Encounter: Payer: Self-pay | Admitting: Cardiovascular Disease

## 2018-05-12 ENCOUNTER — Other Ambulatory Visit: Payer: Self-pay | Admitting: Physician Assistant

## 2018-05-12 ENCOUNTER — Ambulatory Visit: Payer: 59 | Admitting: Cardiovascular Disease

## 2018-05-12 VITALS — BP 134/80 | HR 75 | Ht 73.0 in | Wt 247.0 lb

## 2018-05-12 DIAGNOSIS — I2510 Atherosclerotic heart disease of native coronary artery without angina pectoris: Secondary | ICD-10-CM | POA: Diagnosis not present

## 2018-05-12 DIAGNOSIS — E785 Hyperlipidemia, unspecified: Secondary | ICD-10-CM

## 2018-05-12 MED ORDER — LOSARTAN POTASSIUM 50 MG PO TABS: 50.0000 mg | ORAL_TABLET | Freq: Every day | ORAL | 1 refills | Status: DC

## 2018-05-12 MED ORDER — TICAGRELOR 60 MG PO TABS: 60.0000 mg | ORAL_TABLET | Freq: Two times a day (BID) | ORAL | 1 refills | Status: DC

## 2018-05-12 NOTE — Patient Instructions (Signed)
Medication Instructions:  STOP the Lisinopril START Losartan 50 mg tablet daily DECREASE the Brilinta to 60 mg twice daily  If you need a refill on your cardiac medications before your next appointment, please call your pharmacy.   Lab work: None ordered  Testing/Procedures: None ordered  Follow-Up: At [facility], you and your health needs are our priority.  As part of our continuing mission to provide you with exceptional heart care, we have created designated Provider Care Teams.  These Care Teams include your primary Cardiologist (physician) and Advanced Practice Providers (APPs -  Physician Assistants and Nurse Practitioners) who all work together to provide you with the care you need, when you need it. You will need a follow up appointment in 6 months.  Please call our office 2 months in advance to schedule this appointment.  You may see Dr. Kirke CorinArida or one of the following Advanced Practice Providers on your designated Care Team:   Nicolasa Duckinghristopher Berge, NP Eula Listenyan Dunn, PA-C . Marisue IvanJacquelyn Visser, PA-C

## 2018-05-12 NOTE — Progress Notes (Signed)
Cardiology Office Note   Date:  05/12/2018   ID:  Jeff Warren, DOB 1955/11/18, MRN [id number]  PCP:  Jeff Noble, MD  Cardiologist:   Jeff Bears, MD   Chief Complaint  Patient presents with  . other    6 mo follow up.Slight persistent cough. Medications reviewed verbally.       History of Present Illness: Jeff Warren is a 62 y.o. male who presents for a follow-up visit regarding coronary artery disease.  He had inferior ST elevation myocardial infarction in June 2018.  Emergent cardiac catheterization showed occluded distal right coronary artery which was treated successfully with PCI and drug-eluting stent placement.  There was 80% proximal LAD stenosis but that was staged and treated with PCI in July.  He has known history of severe hyperlipidemia with intolerance to higher dose of atorvastatin.  He is tolerating rosuvastatin and Zetia. He has been doing well with no recent chest pain, shortness of breath or palpitations.  He does complain of dry cough which could be due to lisinopril.  He takes his medications regularly. Unfortunately, he continues to gain weight.  He is planning to retire in the near future.    Past Medical History:  Diagnosis Date  . BPH (benign prostatic hyperplasia)   . CAD (coronary artery disease)    a. inferior ST elevation MI 12/05/16: cardiac cath LM nl, p-mLAD 80%, D1 CTO with right-to-left collats, LCx minor irregs,  m-dRCA 30%, dRCA 100% s/p PCI/DES, EF 45-50%, severely elevated LVEDP; b. staged PCI 12/23/16 - successful PCI/DES to p-mLAD with small diag being jailed by stent (vessel known to be subtotally occluded with R-L collats)  . Family history of premature CAD    a. sister passed from MI at age 78  . Gout   . HLD (hyperlipidemia)   . Hypertension   . Systolic dysfunction    a. TTE 12/05/16: EF 50-55%, inf HK, GR1DD, mildly dilated LA    Past Surgical History:  Procedure Laterality Date  . ABDOMINOPLASTY    . COLONOSCOPY WITH PROPOFOL  N/A 06/29/2016   Procedure: COLONOSCOPY WITH PROPOFOL;  Surgeon: Charolett Bumpers, MD;  Location: [facility];  Service: Endoscopy;  Laterality: N/A;  . CORONARY STENT INTERVENTION N/A 12/23/2016   Procedure: Coronary Stent Intervention;  Surgeon: Iran Ouch, MD;  Location: [facility];  Service: Cardiovascular;  Laterality: N/A;  . CORONARY/GRAFT ACUTE MI REVASCULARIZATION N/A 12/05/2016   Procedure: Coronary/Graft Acute MI Revascularization;  Surgeon: Iran Ouch, MD;  Location: [facility] INVASIVE CV [facility];  Service: Cardiovascular;  Laterality: N/A;  . ganglion cyst removal Right   . LEFT HEART CATH AND CORONARY ANGIOGRAPHY N/A 12/05/2016   Procedure: Left Heart Cath and Coronary Angiography;  Surgeon: Iran Ouch, MD;  Location: [facility] INVASIVE CV LAB;  Service: Cardiovascular;  Laterality: N/A;  . NO PAST SURGERIES     Has has some cosmetic surgeries     Current Outpatient Medications  Medication Sig Dispense Refill  . aspirin 81 MG chewable tablet Chew 1 tablet (81 mg total) by mouth daily.    . carvedilol (COREG) 3.125 MG tablet TAKE 1 TABLET BY MOUTH TWO  TIMES DAILY 180 tablet 0  . colchicine 0.6 MG tablet Take 0.6 mg by mouth 2 (two) times daily as needed (gout).    . COREG 3.125 MG tablet TAKE 1 TABLET BY MOUTH TWO  TIMES DAILY 180 tablet 0  . ezetimibe (ZETIA) 10 MG tablet Take 1 tablet (  10 mg total) by mouth daily. 90 tablet 3  . furosemide (LASIX) 20 MG tablet TAKE 1 TABLET BY MOUTH  DAILY 90 tablet 0  . pantoprazole (PROTONIX) 40 MG tablet Take 1 tablet (40 mg total) by mouth daily as needed. (Patient taking differently: Take 40 mg by mouth daily as needed (acid reflux). ) 30 tablet 1  . rosuvastatin (CRESTOR) 40 MG tablet Take 1 tablet (40 mg total) by mouth daily. 90 tablet 3  . tamsulosin (FLOMAX) 0.4 MG CAPS capsule Take 0.4 mg by mouth daily.    . ticagrelor (BRILINTA) 60 MG TABS tablet Take 1 tablet (60 mg total) by mouth 2 (two) times daily. 180 tablet 1    . losartan (COZAAR) 50 MG tablet Take 1 tablet (50 mg total) by mouth daily. 90 tablet 1   No current facility-administered medications for this visit.     Allergies:   Bextra [valdecoxib]    Social History:  The patient  reports that he has never smoked. He has never used smokeless tobacco. He reports that he does not drink alcohol or use drugs.   Family History:  The patient's family history includes Cancer in his father; Diabetes Mellitus II in his brother, mother, and sister; Kidney failure in his brother.    ROS:  Please see the history of present illness.   Otherwise, review of systems are positive for none.   All other systems are reviewed and negative.    PHYSICAL EXAM: VS:  BP 134/80 (BP Location: Left Arm, Patient Position: Sitting, Cuff Size: Normal)   Pulse 75   Ht 6\' 1"  (1.854 m)   Wt 247 lb (112 kg)   BMI 32.59 kg/m  , BMI Body mass index is 32.59 kg/m. GEN: Well nourished, well developed, in no acute distress  HEENT: normal  Neck: no JVD, carotid bruits, or masses Cardiac: RRR; no murmurs, rubs, or gallops,no edema  Respiratory:  clear to auscultation bilaterally, normal work of breathing GI: soft, nontender, nondistended, + BS MS: no deformity or atrophy  Skin: warm and dry, no rash Neuro:  Strength and sensation are intact Psych: euthymic mood, full affect   EKG:  EKG is ordered today. The ekg ordered today demonstrates normal sinus rhythm with possible left atrial enlargement, left anterior fascicular block with possible old inferior infarct.   Recent Labs: 10/06/2017: ALT 18    Lipid Panel    Component Value Date/Time   CHOL 131 10/06/2017 0934   TRIG 129 10/06/2017 0934   HDL 40 10/06/2017 0934   CHOLHDL 4.0 01/08/2017 1035   CHOLHDL 6.0 12/06/2016 0546   VLDL 34 12/06/2016 0546   LDLCALC 65 10/06/2017 0934      Wt Readings from Last 3 Encounters:  05/12/18 247 lb (112 kg)  07/08/17 238 lb 12 oz (108.3 kg)  01/25/17 220 lb 12 oz (100.1  kg)       No flowsheet data found.    ASSESSMENT AND PLAN:  1.  Coronary artery disease involving native coronary arteries without angina: He is overall doing very well with no anginal symptoms.  Continue medical therapy.   I elected to decrease Brilinta to 60 mg twice daily.  2.  Previous ischemic cardiomyopathy: Most recent echocardiogram in August showed normal LV systolic function with no significant valvular abnormalities.  Dry cough is likely due to lisinopril and thus I switch him to losartan 50 mg daily.  3.  Hyperlipidemia: Lipid profile improved significantly with high-dose rosuvastatin and Zetia with  most recent LDL of 65.    Disposition:   FU with me in 6 months  Signed,  Jeff Bears, MD  05/12/2018 4:01 PM    [facility]

## 2018-05-13 NOTE — Telephone Encounter (Signed)
Please review for refill.  

## 2018-05-25 ENCOUNTER — Other Ambulatory Visit: Payer: Self-pay | Admitting: Cardiovascular Disease

## 2018-10-31 ENCOUNTER — Other Ambulatory Visit: Payer: Self-pay | Admitting: Cardiovascular Disease

## 2018-12-06 ENCOUNTER — Telehealth: Payer: Self-pay | Admitting: *Deleted

## 2018-12-06 NOTE — Telephone Encounter (Signed)
OPTIMIZE Research study: Left message for patient to call office for his 2 year research telephone follow up.

## 2018-12-16 ENCOUNTER — Telehealth: Payer: Self-pay | Admitting: *Deleted

## 2018-12-16 NOTE — Telephone Encounter (Signed)
OPTIMIZE research study: Left message for patient to call research office for the 2 yesr telephone follow up.

## 2018-12-28 ENCOUNTER — Telehealth: Payer: Self-pay | Admitting: *Deleted

## 2018-12-28 NOTE — Telephone Encounter (Signed)
OPTIMIZE Research study Left message for patient to cal research office for 2 year follow up

## 2019-01-05 ENCOUNTER — Encounter: Payer: Self-pay | Admitting: *Deleted

## 2019-01-05 ENCOUNTER — Telehealth: Payer: Self-pay | Admitting: *Deleted

## 2019-01-05 DIAGNOSIS — Z006 Encounter for examination for normal comparison and control in clinical research program: Secondary | ICD-10-CM

## 2019-01-05 NOTE — Telephone Encounter (Signed)
OPTIMIZE Research study: Left message for patient to call the research office for his 2 year follow up.

## 2019-01-05 NOTE — Research (Signed)
OPTIMIZE Research Study 2 year follow up completed. Patient returned call to the Research office states he has been doing well. He denies Chest pain or any adverse events. Medications reviewed with no changes since last follow up.  Next research required follow up will be due in 1 year. I thanked him for his participation.

## 2019-01-09 IMAGING — CR DG CHEST 2V
1 series · 2 of 2 positions shown · non-contrast
Comparison: None.

CLINICAL DATA: Chest pain and diaphoresis at 1 hour duration

EXAM:
CHEST  2 VIEW

[Series 1: dg chest 2 view · 0.14mm/px · 2 of 2 slices shown]
[im 1/2]
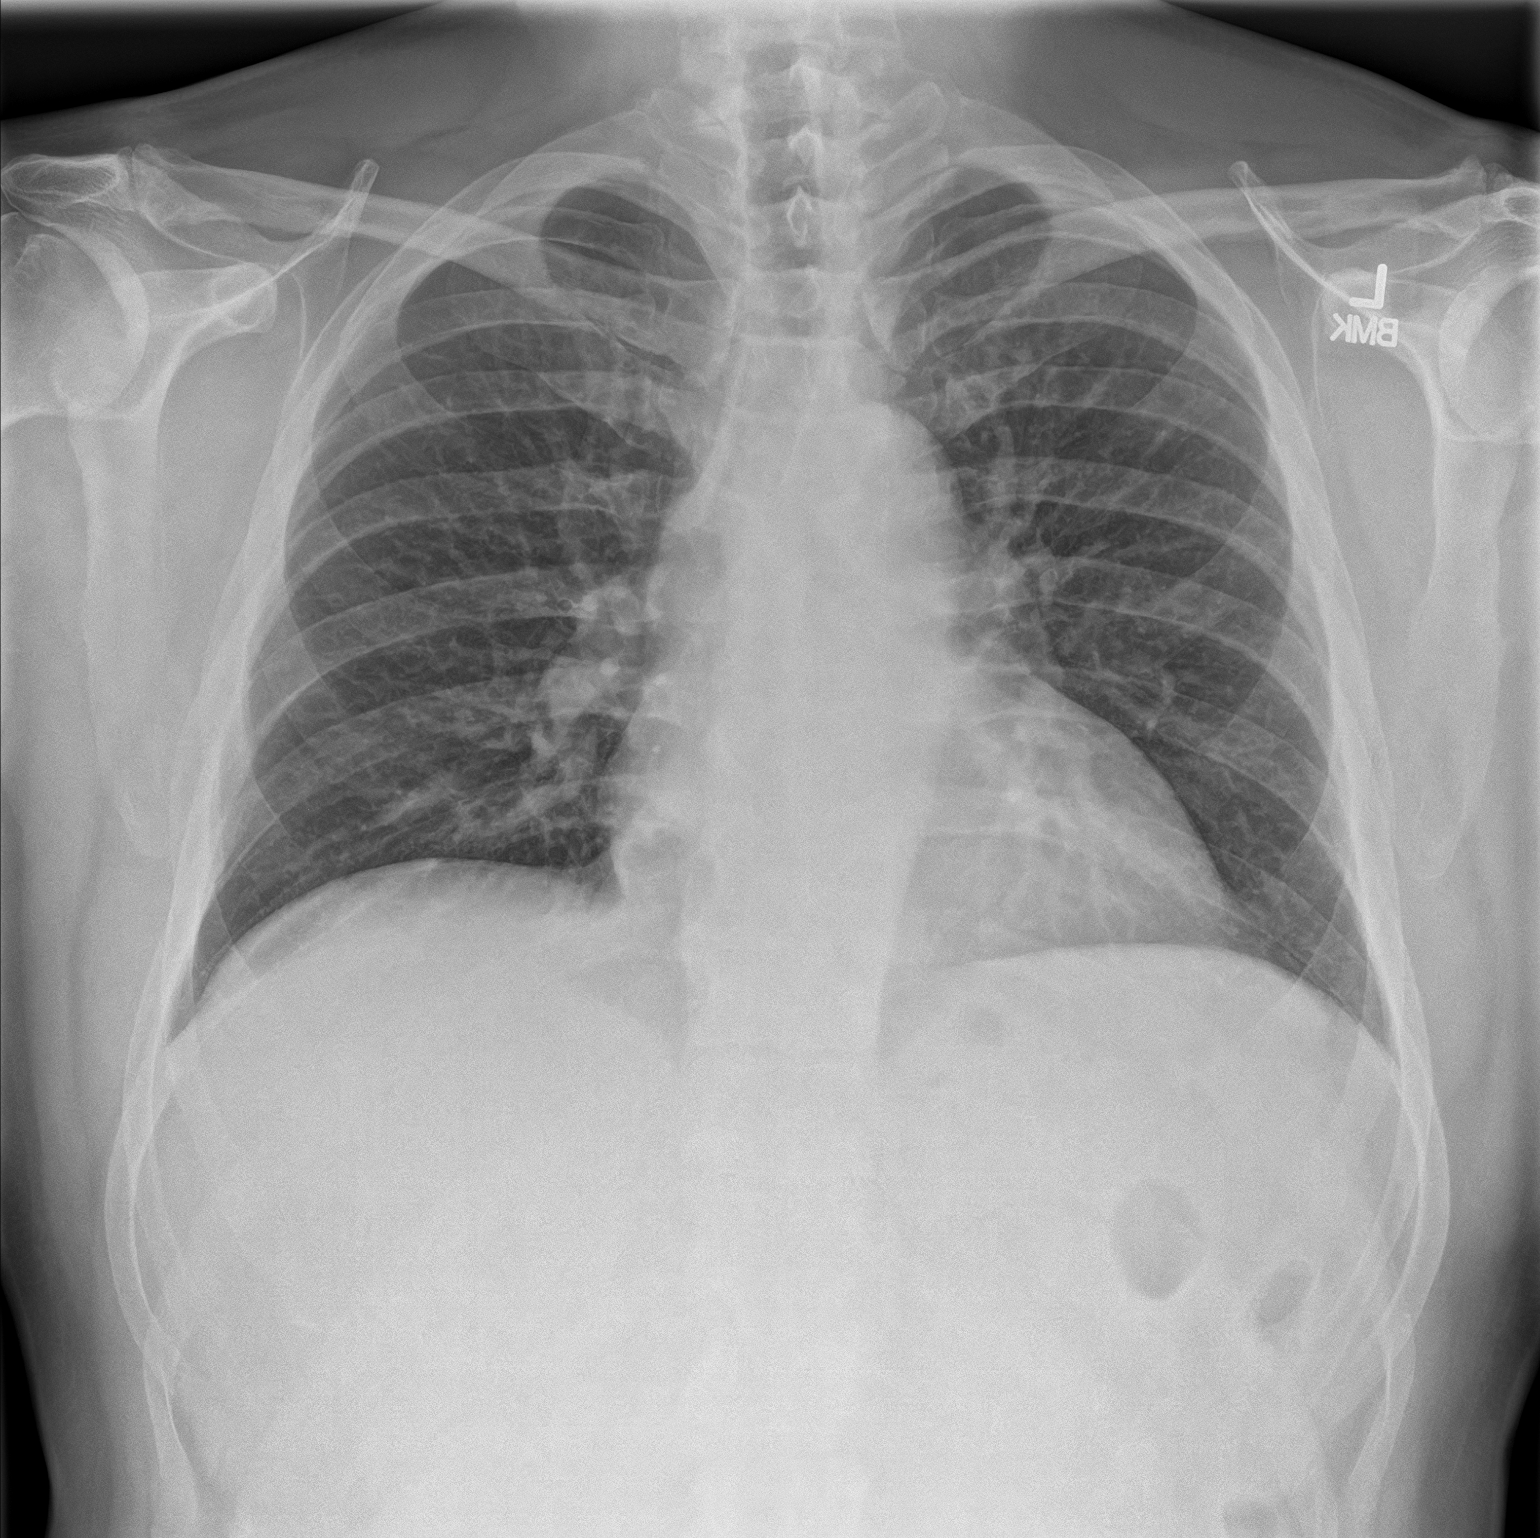
[im 2/2]
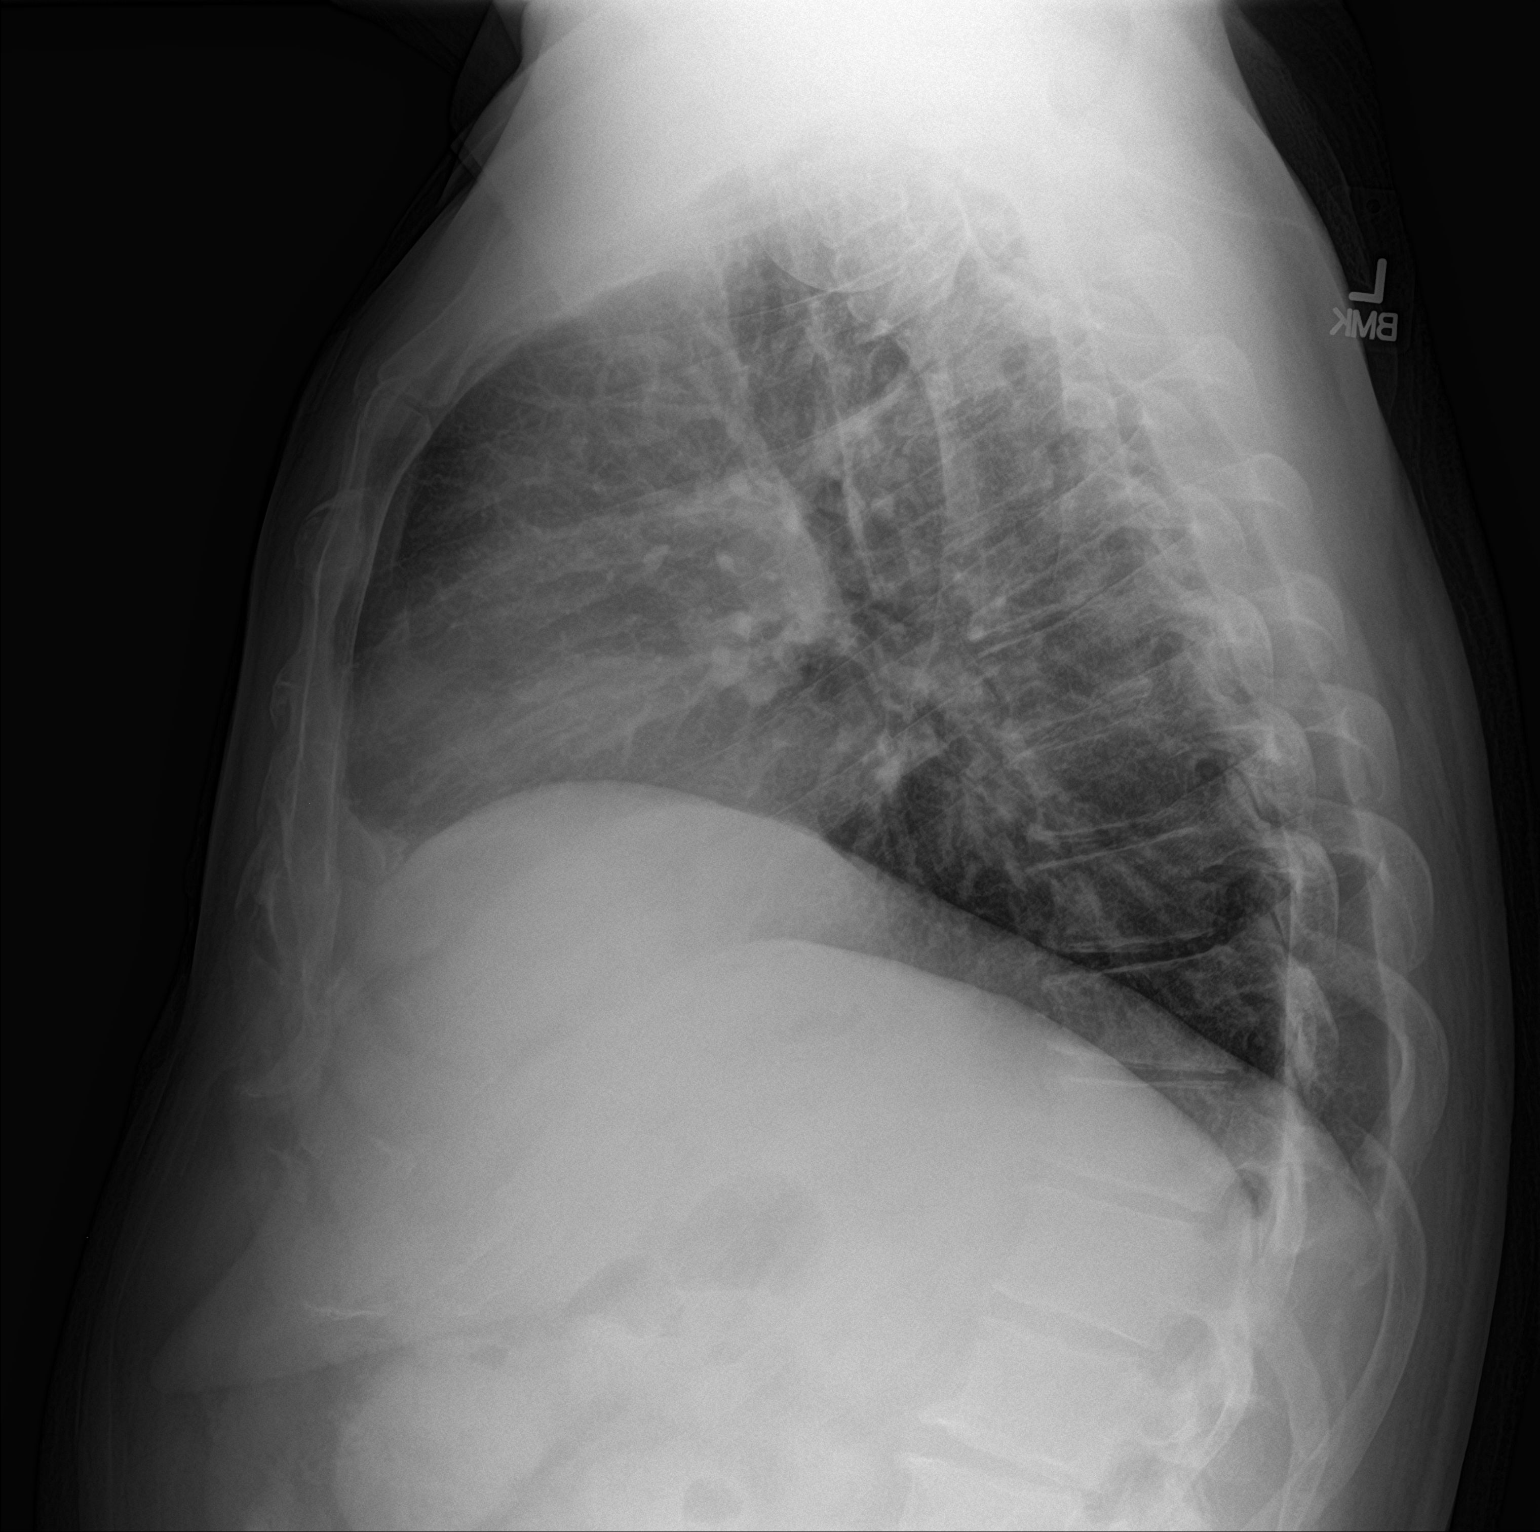

[2 of 2 positions shown; findings below may reference images not displayed]

FINDINGS: The lungs are clear. The pulmonary vasculature is normal. Heart size
is normal. Hilar and mediastinal contours are unremarkable. There is
no pleural effusion.
IMPRESSION: No active cardiopulmonary disease.

## 2019-01-17 ENCOUNTER — Other Ambulatory Visit: Payer: Self-pay | Admitting: Physician Assistant

## 2019-01-18 NOTE — Telephone Encounter (Signed)
Patient needs an appointment for further refills. If patient does not want to schedule an appointment please make them aware to contact PCP for refills. Thanks Ladies!  

## 2019-01-30 ENCOUNTER — Other Ambulatory Visit: Payer: Self-pay | Admitting: Cardiovascular Disease

## 2019-01-30 NOTE — Telephone Encounter (Signed)
Call attempted to schedule appointment for patient with Dr. Fletcher Anon. Unable to leave message-voice mailbox full.

## 2019-02-08 NOTE — Telephone Encounter (Signed)
No ans no vm   °

## 2019-03-01 DIAGNOSIS — M109 Gout, unspecified: Secondary | ICD-10-CM | POA: Insufficient documentation

## 2019-03-24 ENCOUNTER — Other Ambulatory Visit: Payer: Self-pay

## 2019-03-24 ENCOUNTER — Ambulatory Visit (INDEPENDENT_AMBULATORY_CARE_PROVIDER_SITE_OTHER): Payer: 59 | Admitting: Cardiovascular Disease

## 2019-03-24 ENCOUNTER — Encounter: Payer: Self-pay | Admitting: Cardiovascular Disease

## 2019-03-24 VITALS — BP 106/80 | HR 66 | Ht 72.0 in | Wt 234.5 lb

## 2019-03-24 DIAGNOSIS — I2510 Atherosclerotic heart disease of native coronary artery without angina pectoris: Secondary | ICD-10-CM | POA: Diagnosis not present

## 2019-03-24 DIAGNOSIS — E785 Hyperlipidemia, unspecified: Secondary | ICD-10-CM

## 2019-03-24 DIAGNOSIS — I10 Essential (primary) hypertension: Secondary | ICD-10-CM | POA: Diagnosis not present

## 2019-03-24 MED ORDER — FUROSEMIDE 20 MG PO TABS: 20.0000 mg | ORAL_TABLET | ORAL | 3 refills | Status: DC | PRN

## 2019-03-24 NOTE — Patient Instructions (Signed)
Medication Instructions:  1- STOP Brilinta  2- DECREASE Lasix Take 1 tablet (20 mg total) once daily as needed for SOB or swelling. *If you need a refill on your cardiac medications before your next appointment, please call your pharmacy*  Lab Work: None ordered  If you have labs (blood work) drawn today and your tests are completely normal, you will receive your results only by: Marland Kitchen MyChart Message (if you have MyChart) OR . A paper copy in the mail If you have any lab test that is abnormal or we need to change your treatment, we will call you to review the results.  Testing/Procedures: None ordered   Follow-Up: At [facility], you and your health needs are our priority.  As part of our continuing mission to provide you with exceptional heart care, we have created designated Provider Care Teams.  These Care Teams include your primary Cardiologist (physician) and Advanced Practice Providers (APPs -  Physician Assistants and Nurse Practitioners) who all work together to provide you with the care you need, when you need it.  Your next appointment:   6 months  The format for your next appointment:   In Person  Provider:    You may see Dr. Fletcher Anon or one of the following Advanced Practice Providers on your designated Care Team:    Murray Hodgkins, NP  Christell Faith, PA-C  Marrianne Mood, PA-C

## 2019-03-24 NOTE — Progress Notes (Signed)
Cardiology Office Note   Date:  03/24/2019   ID:  Jeff Warren, DOB [birth year], MRN [id number]  PCP:  Jeff Huddle, MD  Cardiologist:   Kathlyn Sacramento, MD   Chief Complaint  Patient presents with  . other    6 month follow up. Meds reviewed by the pt. verbally. "doing well."       History of Present Illness: Jeff Warren is a 63 y.o. male who presents for a follow-up visit regarding coronary artery disease.  He had inferior ST elevation myocardial infarction in June 2018.  Emergent cardiac catheterization showed occluded distal right coronary artery which was treated successfully with PCI and drug-eluting stent placement.  There was 80% proximal LAD stenosis which was subsequently treated with staged PCI.    He has known history of severe hyperlipidemia with intolerance to higher dose of atorvastatin.  He is tolerating rosuvastatin and Zetia. He retired last year.  He has been doing well with no recent chest pain, shortness of breath or palpitations.    Past Medical History:  Diagnosis Date  . BPH (benign prostatic hyperplasia)   . CAD (coronary artery disease)    a. inferior ST elevation MI 12/05/16: cardiac cath LM nl, p-mLAD 80%, D1 CTO with right-to-left collats, LCx minor irregs,  m-dRCA 30%, dRCA 100% s/p PCI/DES, EF 45-50%, severely elevated LVEDP; b. staged PCI 12/23/16 - successful PCI/DES to p-mLAD with small diag being jailed by stent (vessel known to be subtotally occluded with R-L collats)  . Family history of premature CAD    a. sister passed from MI at age 6  . Gout   . HLD (hyperlipidemia)   . Hypertension   . Systolic dysfunction    a. TTE 12/05/16: EF 50-55%, inf HK, GR1DD, mildly dilated LA    Past Surgical History:  Procedure Laterality Date  . ABDOMINOPLASTY    . COLONOSCOPY WITH PROPOFOL N/A 06/29/2016   Procedure: COLONOSCOPY WITH PROPOFOL;  Surgeon: Jeff Fair, MD;  Location: [facility];  Service: Endoscopy;  Laterality: N/A;  . CORONARY STENT  INTERVENTION N/A 12/23/2016   Procedure: Coronary Stent Intervention;  Surgeon: Jeff Hampshire, MD;  Location: [place] [facility];  Service: Cardiovascular;  Laterality: N/A;  . CORONARY/GRAFT ACUTE MI REVASCULARIZATION N/A 12/05/2016   Procedure: Coronary/Graft Acute MI Revascularization;  Surgeon: Jeff Hampshire, MD;  Location: [place] [facility];  Service: Cardiovascular;  Laterality: N/A;  . ganglion cyst removal Right   . LEFT HEART CATH AND CORONARY ANGIOGRAPHY N/A 12/05/2016   Procedure: Left Heart Cath and Coronary Angiography;  Surgeon: Jeff Hampshire, MD;  Location: [place] [facility];  Service: Cardiovascular;  Laterality: N/A;  . NO PAST SURGERIES     Has has some cosmetic surgeries     Current Outpatient Medications  Medication Sig Dispense Refill  . aspirin 81 MG chewable tablet Chew 1 tablet (81 mg total) by mouth daily.    Marland Kitchen BRILINTA 60 MG TABS tablet TAKE 1 TABLET BY MOUTH TWO  TIMES DAILY 180 tablet 0  . carvedilol (COREG) 3.125 MG tablet TAKE 1 TABLET BY MOUTH TWO  TIMES DAILY 180 tablet 3  . colchicine 0.6 MG tablet Take 0.6 mg by mouth 2 (two) times daily as needed (gout).    . COREG 3.125 MG tablet TAKE 1 TABLET BY MOUTH TWO  TIMES DAILY 180 tablet 0  . ezetimibe (ZETIA) 10 MG tablet TAKE 1 TABLET BY MOUTH  DAILY 90 tablet 3  .  furosemide (LASIX) 20 MG tablet TAKE 1 TABLET BY MOUTH  DAILY 90 tablet 3  . losartan (COZAAR) 50 MG tablet TAKE 1 TABLET BY MOUTH  DAILY 90 tablet 0  . pantoprazole (PROTONIX) 40 MG tablet Take 1 tablet (40 mg total) by mouth daily as needed. (Patient taking differently: Take 40 mg by mouth daily as needed (acid reflux). ) 30 tablet 1  . rosuvastatin (CRESTOR) 40 MG tablet TAKE 1 TABLET BY MOUTH  DAILY 90 tablet 3  . tamsulosin (FLOMAX) 0.4 MG CAPS capsule Take 0.4 mg by mouth daily.     No current facility-administered medications for this visit.     Allergies:   Bextra [valdecoxib]    Social History:  The patient  reports that  he has never smoked. He has never used smokeless tobacco. He reports that he does not drink alcohol or use drugs.   Family History:  The patient's family history includes Cancer in his father; Diabetes Mellitus II in his brother, mother, and sister; Kidney failure in his brother.    ROS:  Please see the history of present illness.   Otherwise, review of systems are positive for none.   All other systems are reviewed and negative.    PHYSICAL EXAM: VS:  BP 106/80 (BP Location: Left Arm, Patient Position: Sitting, Cuff Size: Normal)   Pulse 66   Ht 6' (1.829 m)   Wt 234 lb 8 oz (106.4 kg)   BMI 31.80 kg/m  , BMI Body mass index is 31.8 kg/m. GEN: Well nourished, well developed, in no acute distress  HEENT: normal  Neck: no JVD, carotid bruits, or masses Cardiac: RRR; no murmurs, rubs, or gallops,no edema  Respiratory:  clear to auscultation bilaterally, normal work of breathing GI: soft, nontender, nondistended, + BS MS: no deformity or atrophy  Skin: warm and dry, no rash Neuro:  Strength and sensation are intact Psych: euthymic mood, full affect   EKG:  EKG is ordered today. The ekg ordered today demonstrates normal sinus rhythm with possible left atrial enlargement, left anterior fascicular block with possible old inferior infarct.   Recent Labs: No results found for requested labs within last 8760 hours.    Lipid Panel    Component Value Date/Time   CHOL 131 10/06/2017 0934   TRIG 129 10/06/2017 0934   HDL 40 10/06/2017 0934   CHOLHDL 4.0 01/08/2017 1035   CHOLHDL 6.0 12/06/2016 0546   VLDL 34 12/06/2016 0546   LDLCALC 65 10/06/2017 0934      Wt Readings from Last 3 Encounters:  03/24/19 234 lb 8 oz (106.4 kg)  05/12/18 247 lb (112 kg)  07/08/17 238 lb 12 oz (108.3 kg)       No flowsheet data found.    ASSESSMENT AND PLAN:  1.  Coronary artery disease involving native coronary arteries without angina: He is overall doing very well with no anginal  symptoms.  Continue medical therapy.   It has been more than 3 years since myocardial infarction and stent placement.  I elected to discontinue Brilinta.  Continue aspirin indefinitely.   2.  Previous ischemic cardiomyopathy: Most recent echocardiogram in August showed normal LV systolic function with no significant valvular abnormalities.  Continue carvedilol and losartan.  I asked him to use furosemide only as needed.  3.  Hyperlipidemia: Lipid profile improved significantly with high-dose rosuvastatin and Zetia .  I reviewed most recent lipid profile done in June with his primary care physician which showed an LDL of  53.  4.  Essential hypertension: Blood pressures controlled on current medications.   Disposition:   FU with me in 6 months  Signed,  [name], MD  03/24/2019 3:19 PM    [facility]

## 2019-04-22 ENCOUNTER — Other Ambulatory Visit: Payer: Self-pay | Admitting: Cardiovascular Disease

## 2019-06-28 ENCOUNTER — Other Ambulatory Visit: Payer: Self-pay | Admitting: Cardiovascular Disease

## 2019-09-17 ENCOUNTER — Other Ambulatory Visit: Payer: Self-pay | Admitting: Cardiovascular Disease

## 2019-12-07 ENCOUNTER — Other Ambulatory Visit: Payer: Self-pay | Admitting: Cardiovascular Disease

## 2019-12-08 NOTE — Telephone Encounter (Signed)
Please schedule overdue 6 month F/U appointment. Thank you! °

## 2019-12-12 NOTE — Telephone Encounter (Signed)
LVM for patient to call back. ?

## 2019-12-13 NOTE — Telephone Encounter (Signed)
Attempted to schedule.  LMOV to call office.  ° °

## 2019-12-27 ENCOUNTER — Encounter: Payer: Self-pay | Admitting: Cardiovascular Disease

## 2019-12-27 NOTE — Telephone Encounter (Signed)
Unable to reach . Mailed letter .  Deleting recall.   Attempted to schedule no ans no vm

## 2020-05-14 DIAGNOSIS — Z006 Encounter for examination for normal comparison and control in clinical research program: Secondary | ICD-10-CM

## 2020-05-14 NOTE — Research (Signed)
Optimize Research Study  Patient doing well at this time, no medication changes or adverse events to report at this time. Will follow up with patient in one year.  Current Outpatient Medications:  .  aspirin 81 MG chewable tablet, Chew 1 tablet (81 mg total) by mouth daily., Disp: , Rfl:  .  carvedilol (COREG) 3.125 MG tablet, TAKE 1 TABLET BY MOUTH TWO  TIMES DAILY, Disp: 180 tablet, Rfl: 3 .  colchicine 0.6 MG tablet, Take 0.6 mg by mouth 2 (two) times daily as needed (gout)., Disp: , Rfl:  .  ezetimibe (ZETIA) 10 MG tablet, Take 1 tablet (10 mg total) by mouth daily. Please schedule office visit for further refills. Thank you!, Disp: 30 tablet, Rfl: 0 .  furosemide (LASIX) 20 MG tablet, Take 1 tablet (20 mg total) by mouth as needed for fluid or edema (Take 1 tablet (20 mg total) once daily as needed for SOB or swelling.)., Disp: 90 tablet, Rfl: 3 .  losartan (COZAAR) 50 MG tablet, TAKE 1 TABLET BY MOUTH  DAILY, Disp: 90 tablet, Rfl: 1 .  pantoprazole (PROTONIX) 40 MG tablet, Take 1 tablet (40 mg total) by mouth daily as needed. (Patient taking differently: Take 40 mg by mouth daily as needed (acid reflux). ), Disp: 30 tablet, Rfl: 1 .  rosuvastatin (CRESTOR) 40 MG tablet, Take 1 tablet (40 mg total) by mouth daily. Please call to schedule appointment for further refills. Thank you!, Disp: 30 tablet, Rfl: 0 .  tamsulosin (FLOMAX) 0.4 MG CAPS capsule, Take 0.4 mg by mouth daily., Disp: , Rfl:    BRILINTA 60 MG TABS tablet TAKE 1 TABLET BY MOUTH TWO  TIMES DAILY

## 2020-07-08 ENCOUNTER — Telehealth: Payer: Self-pay | Admitting: Cardiovascular Disease

## 2020-07-08 NOTE — Telephone Encounter (Signed)
3 attempts to schedule fu appt from recall list.   Deleting recall.   

## 2020-11-21 ENCOUNTER — Inpatient Hospital Stay (HOSPITAL_COMMUNITY): Payer: Medicare Other | Admitting: Anesthesiology

## 2020-11-21 ENCOUNTER — Telehealth: Payer: Self-pay | Admitting: Internal Medicine

## 2020-11-21 ENCOUNTER — Encounter (HOSPITAL_COMMUNITY): Admission: RE | Disposition: A | Discharge status: Home / Self Care | Payer: Self-pay | Attending: Ophthalmology

## 2020-11-21 ENCOUNTER — Other Ambulatory Visit: Payer: Self-pay

## 2020-11-21 ENCOUNTER — Other Ambulatory Visit: Payer: Self-pay | Admitting: Ophthalmology

## 2020-11-21 ENCOUNTER — Ambulatory Visit (HOSPITAL_COMMUNITY)
Admission: RE | Admit: 2020-11-21 | Discharge: 2020-11-22 | Disposition: A | Discharge status: Home / Self Care | Payer: Medicare Other | Source: Other Acute Inpatient Hospital | Attending: Ophthalmology | Admitting: Ophthalmology

## 2020-11-21 ENCOUNTER — Encounter (HOSPITAL_COMMUNITY): Payer: Self-pay | Admitting: Ophthalmology

## 2020-11-21 DIAGNOSIS — Z79899 Other long term (current) drug therapy: Secondary | ICD-10-CM | POA: Insufficient documentation

## 2020-11-21 DIAGNOSIS — I2510 Atherosclerotic heart disease of native coronary artery without angina pectoris: Secondary | ICD-10-CM | POA: Insufficient documentation

## 2020-11-21 DIAGNOSIS — E785 Hyperlipidemia, unspecified: Secondary | ICD-10-CM | POA: Insufficient documentation

## 2020-11-21 DIAGNOSIS — Z955 Presence of coronary angioplasty implant and graft: Secondary | ICD-10-CM | POA: Insufficient documentation

## 2020-11-21 DIAGNOSIS — Z8249 Family history of ischemic heart disease and other diseases of the circulatory system: Secondary | ICD-10-CM | POA: Insufficient documentation

## 2020-11-21 DIAGNOSIS — Z888 Allergy status to other drugs, medicaments and biological substances status: Secondary | ICD-10-CM | POA: Diagnosis not present

## 2020-11-21 DIAGNOSIS — H3322 Serous retinal detachment, left eye: Secondary | ICD-10-CM | POA: Diagnosis not present

## 2020-11-21 DIAGNOSIS — I252 Old myocardial infarction: Secondary | ICD-10-CM | POA: Diagnosis not present

## 2020-11-21 DIAGNOSIS — I10 Essential (primary) hypertension: Secondary | ICD-10-CM | POA: Diagnosis not present

## 2020-11-21 DIAGNOSIS — H33002 Unspecified retinal detachment with retinal break, left eye: Secondary | ICD-10-CM

## 2020-11-21 HISTORY — PX: GAS INSERTION: SHX5336

## 2020-11-21 HISTORY — DX: Dyspnea, unspecified: R06.00

## 2020-11-21 HISTORY — PX: VITRECTOMY 25 GAUGE WITH SCLERAL BUCKLE: SHX6183

## 2020-11-21 HISTORY — PX: GAS/FLUID EXCHANGE: SHX5334

## 2020-11-21 LAB — BASIC METABOLIC PANEL
Anion gap: 12 (ref 5–15)
BUN: 14 mg/dL (ref 8–23)
CO2: 23 mmol/L (ref 22–32)
Calcium: 8.9 mg/dL (ref 8.9–10.3)
Chloride: 106 mmol/L (ref 98–111)
Creatinine, Ser: 1.19 mg/dL (ref 0.61–1.24)
GFR, Estimated: >60 mL/min (ref >60)
Glucose, Bld: 95 mg/dL (ref 70–99)
Potassium: 4.4 mmol/L (ref 3.5–5.1)
Sodium: 141 mmol/L (ref 135–145)

## 2020-11-21 SURGERY — VITRECTOMY, USING 25-GAUGE INSTRUMENTS, WITH SCLERAL BUCKLING
Anesthesia: General | Site: Eye | Laterality: Left

## 2020-11-21 MED ORDER — CEFAZOLIN SUBCONJUNCTIVAL INJECTION 100 MG/0.5 ML
INJECTION | SUBCONJUNCTIVAL | Status: DC | PRN
  Administered 2020-11-21: 100 mg via SUBCONJUNCTIVAL

## 2020-11-21 MED ORDER — LIDOCAINE HCL (PF) 2 % IJ SOLN
INTRAMUSCULAR | Status: AC
  Filled 2020-11-21: qty 5

## 2020-11-21 MED ORDER — EPHEDRINE 5 MG/ML INJ
INTRAVENOUS | Status: AC
  Filled 2020-11-21: qty 10

## 2020-11-21 MED ORDER — LIDOCAINE HCL 1 % IJ SOLN
INTRAMUSCULAR | Status: AC
  Filled 2020-11-21: qty 20

## 2020-11-21 MED ORDER — FENTANYL CITRATE (PF) 250 MCG/5ML IJ SOLN
INTRAMUSCULAR | Status: DC | PRN
  Administered 2020-11-21: 100 ug via INTRAVENOUS

## 2020-11-21 MED ORDER — SODIUM CHLORIDE 0.9 % IV SOLN: INTRAVENOUS | Status: DC

## 2020-11-21 MED ORDER — PHENYLEPHRINE HCL 2.5 % OP SOLN
1.0000 [drp] | OPHTHALMIC | Status: DC | PRN
  Administered 2020-11-21 (×2): 1 [drp] via OPHTHALMIC

## 2020-11-21 MED ORDER — ROCURONIUM BROMIDE 10 MG/ML (PF) SYRINGE
PREFILLED_SYRINGE | INTRAVENOUS | Status: DC | PRN
  Administered 2020-11-21: 60 mg via INTRAVENOUS
  Administered 2020-11-21: 20 mg via INTRAVENOUS

## 2020-11-21 MED ORDER — PHENYLEPHRINE HCL 2.5 % OP SOLN
OPHTHALMIC | Status: AC
  Administered 2020-11-21: 1 [drp] via OPHTHALMIC
  Filled 2020-11-21: qty 2

## 2020-11-21 MED ORDER — NA CHONDROIT SULF-NA HYALURON 40-30 MG/ML IO SOSY
INTRAOCULAR | Status: DC | PRN
  Administered 2020-11-21: 0.5 mL via INTRAOCULAR

## 2020-11-21 MED ORDER — DEXAMETHASONE SODIUM PHOSPHATE 10 MG/ML IJ SOLN
INTRAMUSCULAR | Status: DC | PRN
  Administered 2020-11-21: 1 mg

## 2020-11-21 MED ORDER — ATROPINE SULFATE 1 % OP SOLN
OPHTHALMIC | Status: AC
  Administered 2020-11-21: 1 [drp] via OPHTHALMIC
  Filled 2020-11-21: qty 5

## 2020-11-21 MED ORDER — ARTIFICIAL TEARS OPHTHALMIC OINT
TOPICAL_OINTMENT | OPHTHALMIC | Status: AC
  Filled 2020-11-21: qty 3.5

## 2020-11-21 MED ORDER — ONDANSETRON HCL 4 MG/2ML IJ SOLN
INTRAMUSCULAR | Status: AC
  Filled 2020-11-21: qty 2

## 2020-11-21 MED ORDER — PHENYLEPHRINE HCL (PRESSORS) 10 MG/ML IV SOLN
INTRAVENOUS | Status: AC
  Filled 2020-11-21: qty 1

## 2020-11-21 MED ORDER — STERILE WATER FOR INJECTION IJ SOLN
INTRAMUSCULAR | Status: DC | PRN
  Administered 2020-11-21: 200 mL

## 2020-11-21 MED ORDER — FENTANYL CITRATE (PF) 250 MCG/5ML IJ SOLN
INTRAMUSCULAR | Status: AC
  Filled 2020-11-21: qty 5

## 2020-11-21 MED ORDER — PROPOFOL 10 MG/ML IV BOLUS
INTRAVENOUS | Status: DC | PRN
  Administered 2020-11-21: 180 mg via INTRAVENOUS

## 2020-11-21 MED ORDER — BSS IO SOLN
INTRAOCULAR | Status: DC | PRN
  Administered 2020-11-21: 15 mL via INTRAOCULAR

## 2020-11-21 MED ORDER — MIDAZOLAM HCL 2 MG/2ML IJ SOLN
INTRAMUSCULAR | Status: AC
  Filled 2020-11-21: qty 2

## 2020-11-21 MED ORDER — DEXAMETHASONE SODIUM PHOSPHATE 10 MG/ML IJ SOLN
INTRAMUSCULAR | Status: AC
  Filled 2020-11-21: qty 1

## 2020-11-21 MED ORDER — EPINEPHRINE PF 1 MG/ML IJ SOLN: INTRAOCULAR | Status: DC | PRN

## 2020-11-21 MED ORDER — ATROPINE SULFATE 1 % OP SOLN
1.0000 [drp] | OPHTHALMIC | Status: DC | PRN
  Administered 2020-11-21 (×2): 1 [drp] via OPHTHALMIC

## 2020-11-21 MED ORDER — BUPIVACAINE HCL (PF) 0.75 % IJ SOLN
INTRAMUSCULAR | Status: DC | PRN
  Administered 2020-11-21: 10 mL

## 2020-11-21 MED ORDER — 0.9 % SODIUM CHLORIDE (POUR BTL) OPTIME
TOPICAL | Status: DC | PRN
  Administered 2020-11-21: 100 mL

## 2020-11-21 MED ORDER — MIDAZOLAM HCL 2 MG/2ML IJ SOLN
INTRAMUSCULAR | Status: DC | PRN
  Administered 2020-11-21: 2 mg via INTRAVENOUS

## 2020-11-21 MED ORDER — DEXAMETHASONE SODIUM PHOSPHATE 10 MG/ML IJ SOLN
INTRAMUSCULAR | Status: DC | PRN
  Administered 2020-11-21: 5 mg via INTRAVENOUS

## 2020-11-21 MED ORDER — BUPIVACAINE HCL (PF) 0.75 % IJ SOLN
INTRAMUSCULAR | Status: AC
  Filled 2020-11-21: qty 10

## 2020-11-21 MED ORDER — DEXAMETHASONE SODIUM PHOS SUBCONJUNCTIVAL INJECTION 4 MG/ML
1.0000 mg | SUBCONJUNCTIVAL | Status: DC
  Filled 2020-11-21: qty 0.25

## 2020-11-21 MED ORDER — PROPOFOL 10 MG/ML IV BOLUS
INTRAVENOUS | Status: AC
  Filled 2020-11-21: qty 20

## 2020-11-21 MED ORDER — ONDANSETRON HCL 4 MG/2ML IJ SOLN: 4.0000 mg | Freq: Once | INTRAMUSCULAR | Status: DC | PRN

## 2020-11-21 MED ORDER — EPHEDRINE SULFATE-NACL 50-0.9 MG/10ML-% IV SOSY
PREFILLED_SYRINGE | INTRAVENOUS | Status: DC | PRN
  Administered 2020-11-21: 15 mg via INTRAVENOUS

## 2020-11-21 MED ORDER — FENTANYL CITRATE (PF) 100 MCG/2ML IJ SOLN: 25.0000 ug | INTRAMUSCULAR | Status: DC | PRN

## 2020-11-21 MED ORDER — SUGAMMADEX SODIUM 200 MG/2ML IV SOLN
INTRAVENOUS | Status: DC | PRN
  Administered 2020-11-21: 200 mg via INTRAVENOUS

## 2020-11-21 MED ORDER — LIDOCAINE HCL (PF) 1 % IJ SOLN
INTRAMUSCULAR | Status: DC | PRN
  Administered 2020-11-21: 10 mL

## 2020-11-21 MED ORDER — PHENYLEPHRINE 40 MCG/ML (10ML) SYRINGE FOR IV PUSH (FOR BLOOD PRESSURE SUPPORT)
PREFILLED_SYRINGE | INTRAVENOUS | Status: DC | PRN
  Administered 2020-11-21: 160 ug via INTRAVENOUS

## 2020-11-21 MED ORDER — TOBRAMYCIN-DEXAMETHASONE 0.3-0.1 % OP OINT
TOPICAL_OINTMENT | OPHTHALMIC | Status: DC | PRN
  Administered 2020-11-21: 1 via OPHTHALMIC

## 2020-11-21 MED ORDER — ONDANSETRON HCL 4 MG/2ML IJ SOLN
INTRAMUSCULAR | Status: DC | PRN
  Administered 2020-11-21: 4 mg via INTRAVENOUS

## 2020-11-21 MED ORDER — LIDOCAINE 2% (20 MG/ML) 5 ML SYRINGE
INTRAMUSCULAR | Status: DC | PRN
  Administered 2020-11-21: 60 mg via INTRAVENOUS

## 2020-11-21 MED ORDER — LIDOCAINE HCL 2 % IJ SOLN
INTRAMUSCULAR | Status: AC
  Filled 2020-11-21: qty 20

## 2020-11-21 MED ORDER — CHLORHEXIDINE GLUCONATE 0.12 % MT SOLN
OROMUCOSAL | Status: AC
  Administered 2020-11-21: 15 mL
  Filled 2020-11-21: qty 15

## 2020-11-21 MED ORDER — PHENYLEPHRINE 40 MCG/ML (10ML) SYRINGE FOR IV PUSH (FOR BLOOD PRESSURE SUPPORT)
PREFILLED_SYRINGE | INTRAVENOUS | Status: AC
  Filled 2020-11-21: qty 10

## 2020-11-21 MED ORDER — ROCURONIUM BROMIDE 10 MG/ML (PF) SYRINGE
PREFILLED_SYRINGE | INTRAVENOUS | Status: AC
  Filled 2020-11-21: qty 10

## 2020-11-21 MED ORDER — CEFAZOLIN SUBCONJUNCTIVAL INJECTION 100 MG/0.5 ML
100.0000 mg | INJECTION | SUBCONJUNCTIVAL | Status: DC
  Filled 2020-11-21: qty 5

## 2020-11-21 MED ORDER — PHENYLEPHRINE HCL-NACL 10-0.9 MG/250ML-% IV SOLN
INTRAVENOUS | Status: DC | PRN
  Administered 2020-11-21: 25 ug/min via INTRAVENOUS

## 2020-11-21 SURGICAL SUPPLY — 90 items
ACCESSORY FRAGMATOME (MISCELLANEOUS) IMPLANT
APL SRG 3 HI ABS STRL LF PLS (MISCELLANEOUS) ×2
APL SWBSTK 6 STRL LF DISP (MISCELLANEOUS) ×1
APPLICATOR COTTON TIP 6 STRL (MISCELLANEOUS) ×1 IMPLANT
APPLICATOR COTTON TIP 6IN STRL (MISCELLANEOUS) ×3 IMPLANT
APPLICATOR DR MATTHEWS STRL (MISCELLANEOUS) ×5 IMPLANT
BAND SCLERAL BUCKLING TYPE 41 (Ophthalmic Related) ×2 IMPLANT
BAND WRIST GAS GREEN (MISCELLANEOUS) IMPLANT
BETADINE 5% OPHTHALMIC (OPHTHALMIC) IMPLANT
BLADE MINI 60D BLUE (BLADE) ×1 IMPLANT
BLADE MINI RND TIP GREEN BEAV (BLADE) IMPLANT
BNDG EYE OVAL (GAUZE/BANDAGES/DRESSINGS) ×4 IMPLANT
CABLE BIPOLOR RESECTION CORD (MISCELLANEOUS) ×2 IMPLANT
CANNULA ANT CHAM MAIN (OPHTHALMIC RELATED) IMPLANT
CANNULA ANTERIOR CHAMBER 27GA (MISCELLANEOUS) ×2 IMPLANT
CANNULA DUAL BORE 23G (CANNULA) IMPLANT
CANNULA VLV SOFT TIP 25G (OPHTHALMIC) IMPLANT
CANNULA VLV SOFT TIP 25GA (OPHTHALMIC) ×3 IMPLANT
CAUTERY EYE LOW TEMP 1300F FIN (OPHTHALMIC RELATED) ×1 IMPLANT
CLOSURE STERI-STRIP 1/2X4 (GAUZE/BANDAGES/DRESSINGS) ×1
CLSR STERI-STRIP ANTIMIC 1/2X4 (GAUZE/BANDAGES/DRESSINGS) ×1 IMPLANT
CORD BIPOLAR FORCEPS 12FT (ELECTRODE) IMPLANT
COVER MAYO STAND STRL (DRAPES) ×3 IMPLANT
COVER SURGICAL LIGHT HANDLE (MISCELLANEOUS) ×1 IMPLANT
COVER WAND RF STERILE (DRAPES) ×1 IMPLANT
DRAPE HALF SHEET 40X57 (DRAPES) ×3 IMPLANT
DRAPE RETRACTOR (MISCELLANEOUS) ×1 IMPLANT
ERASER HMR WETFIELD 23G BP (MISCELLANEOUS) IMPLANT
FILTER BLUE MILLIPORE (MISCELLANEOUS) ×2 IMPLANT
FILTER STRAW FLUID ASPIR (MISCELLANEOUS) IMPLANT
FORCEPS GRIESHABER ILM 25G A (INSTRUMENTS) IMPLANT
GAS AUTO FILL CONSTEL (OPHTHALMIC) ×3
GAS AUTO FILL CONSTELLATION (OPHTHALMIC) ×1 IMPLANT
GAS OPHTHALMIC (MISCELLANEOUS) ×2 IMPLANT
GAS WRIST BAND GREEN (MISCELLANEOUS) ×3
GLOVE BIO SURGEON STRL SZ7.5 (GLOVE) ×6 IMPLANT
GLOVE SRG 8 PF TXTR STRL LF DI (GLOVE) IMPLANT
GLOVE SURG UNDER POLY LF SZ8 (GLOVE) ×3
GOWN STRL REUS W/ TWL LRG LVL3 (GOWN DISPOSABLE) ×2 IMPLANT
GOWN STRL REUS W/TWL 2XL LVL3 (GOWN DISPOSABLE) ×2 IMPLANT
GOWN STRL REUS W/TWL LRG LVL3 (GOWN DISPOSABLE) ×6
HANDLE PNEUMATIC FOR CONSTEL (OPHTHALMIC) IMPLANT
KIT BASIN OR (CUSTOM PROCEDURE TRAY) ×3 IMPLANT
KIT PERFLUORON PROCEDURE 5ML (MISCELLANEOUS) IMPLANT
KIT TURNOVER KIT B (KITS) ×3 IMPLANT
LENS BIOM SUPER VIEW SET DISP (MISCELLANEOUS) ×3 IMPLANT
NDL 18GX1X1/2 (RX/OR ONLY) (NEEDLE) ×1 IMPLANT
NDL 25GX 5/8IN NON SAFETY (NEEDLE) ×1 IMPLANT
NDL FILTER BLUNT 18X1 1/2 (NEEDLE) IMPLANT
NDL HYPO 25GX1X1/2 BEV (NEEDLE) IMPLANT
NDL HYPO 30X.5 LL (NEEDLE) ×2 IMPLANT
NDL RETROBULBAR 25GX1.5 (NEEDLE) IMPLANT
NEEDLE 18GX1X1/2 (RX/OR ONLY) (NEEDLE) ×3 IMPLANT
NEEDLE 25GX 5/8IN NON SAFETY (NEEDLE) ×6 IMPLANT
NEEDLE FILTER BLUNT 18X 1/2SAF (NEEDLE) ×2
NEEDLE FILTER BLUNT 18X1 1/2 (NEEDLE) ×1 IMPLANT
NEEDLE HYPO 25GX1X1/2 BEV (NEEDLE) IMPLANT
NEEDLE HYPO 30X.5 LL (NEEDLE) ×6 IMPLANT
NEEDLE RETROBULBAR 25GX1.5 (NEEDLE) ×3 IMPLANT
NS IRRIG 1000ML POUR BTL (IV SOLUTION) ×3 IMPLANT
OPHTHALMIC BETADINE 5% (OPHTHALMIC) ×3
PACK FRAGMATOME (OPHTHALMIC) IMPLANT
PACK VITRECTOMY CUSTOM (CUSTOM PROCEDURE TRAY) ×3 IMPLANT
PAD ARMBOARD 7.5X6 YLW CONV (MISCELLANEOUS) ×6 IMPLANT
PAK PIK VITRECTOMY CVS 25GA (OPHTHALMIC) ×5 IMPLANT
PENCIL BIPOLAR 25GA STR DISP (OPHTHALMIC RELATED) IMPLANT
PROBE ENDO DIATHERMY 25G (MISCELLANEOUS) ×3 IMPLANT
PROBE LASER ILLUM FLEX CVD 23G (OPHTHALMIC) IMPLANT
PROBE LASER ILLUM FLEX CVD 25G (OPHTHALMIC) ×3 IMPLANT
REPL STRA BRUSH NDL (NEEDLE) IMPLANT
REPL STRA BRUSH NEEDLE (NEEDLE) IMPLANT
RESERVOIR BACK FLUSH (MISCELLANEOUS) IMPLANT
RETRACTOR IRIS FLEX 25G GRIESH (INSTRUMENTS) IMPLANT
ROLLS DENTAL (MISCELLANEOUS) IMPLANT
SET INJECTOR OIL FLUID CONSTEL (OPHTHALMIC) IMPLANT
SOL PREP PROV IODINE SCRUB 4OZ (MISCELLANEOUS) ×2 IMPLANT
STOCKINETTE IMPERVIOUS 9X36 MD (GAUZE/BANDAGES/DRESSINGS) ×6 IMPLANT
STOCKINETTE IMPERVIOUS LG (DRAPES) ×6 IMPLANT
STOPCOCK 4 WAY LG BORE MALE ST (IV SETS) IMPLANT
SUT ETHILON 5.0 S-24 (SUTURE) ×5 IMPLANT
SUT SILK 2 0 (SUTURE) ×3
SUT SILK 2-0 18XBRD TIE 12 (SUTURE) ×1 IMPLANT
SUT VICRYL 7 0 TG140 8 (SUTURE) ×2 IMPLANT
SYR 10ML LL (SYRINGE) IMPLANT
SYR 20ML LL LF (SYRINGE) ×2 IMPLANT
SYR 5ML LL (SYRINGE) IMPLANT
SYR TB 1ML LUER SLIP (SYRINGE) ×2 IMPLANT
TOWEL GREEN STERILE FF (TOWEL DISPOSABLE) ×6 IMPLANT
WATER STERILE IRR 1000ML POUR (IV SOLUTION) ×3 IMPLANT
WIPE INSTRUMENT VISIWIPE 73X73 (MISCELLANEOUS) ×2 IMPLANT

## 2020-11-21 NOTE — Brief Op Note (Signed)
11/21/2020  11:07 PM  PATIENT:  Jeff Warren  65 y.o. male  PRE-OPERATIVE DIAGNOSIS:  RETINAL DETACHMENT LEFT EYE  POST-OPERATIVE DIAGNOSIS:  * No post-op diagnosis entered *  PROCEDURE:  Procedure(s): VITRECTOMY 25 GAUGE WITH SCLERAL BUCKLE (Left) GAS/FLUID EXCHANGE (Left)  SURGEON:  Surgeon(s) and Role:    Stephannie Li, MD - Primary  PHYSICIAN ASSISTANT:   ASSISTANTS: none   ANESTHESIA:   none  EBL:  min   BLOOD ADMINISTERED:none  DRAINS: none   LOCAL MEDICATIONS USED:  BUPIVICAINE   SPECIMEN:  No Specimen  DISPOSITION OF SPECIMEN:  N/A  COUNTS:  YES  TOURNIQUET:  * No tourniquets in log *  DICTATION: .[id number]   PLAN OF CARE: Discharge to home after PACU  PATIENT DISPOSITION:  PACU - hemodynamically stable.   Delay start of Pharmacological VTE agent (>24hrs) due to surgical blood loss or risk of bleeding: not applicable

## 2020-11-21 NOTE — Telephone Encounter (Signed)
     Dr. Allyne Gee would like to speak with DOD

## 2020-11-21 NOTE — Telephone Encounter (Signed)
Thanks [place].   Jeff Warren,  This patient needs a routine follow-up visit scheduled as he is overdue for follow-up.

## 2020-11-21 NOTE — Telephone Encounter (Signed)
   Called by Dr. Allyne Gee with ophthalmology and [place] regarding Jeff Warren.  Unfortunately presented with acute retinal detachment.  He will need more urgent surgery.  He was evaluated by anesthesia and found to have ventricular bigeminy which is a new finding.  Is not clear if he is symptomatic with this however his perfusion rate is in the 30s.  According to Dr. Chestine Spore, the anesthesiologist, with whom I spoke, he has been having some on and off chest discomfort but has not seen [name] since 2020.  He does have a history of coronary disease and prior stenting but had been taken off of Brilinta.  As this is a urgent procedure, would recommend proceeding.  If he were to have some nonsustained VT he could potentially get some amiodarone during the procedure under general anesthesia to help with this.  It was suggested that he go to [facility] for the procedure and cardiology is certainly available and willing to help perioperatively.  Would recommend follow-up with [name] for intermittent chest pain symptoms.  Chrystie Nose, MD, [facility], FACP  [place]  [facility]  Medical Director of the [facility]  [facility] Diplomate of the American Board of Clinical Lipidology Attending Cardiologist  Direct Dial: [phone]  Fax: [phone]  Website:  [email or web address]

## 2020-11-21 NOTE — Anesthesia Postprocedure Evaluation (Signed)
Anesthesia Post Note  Patient: Jeff Warren  Procedure(s) Performed: VITRECTOMY 25 GAUGE WITH SCLERAL BUCKLE (Left: Eye) GAS/FLUID EXCHANGE (Left: Eye) INSERTION OF GAS LEFT EYE (C3F8) (Left: Eye)     Patient location during evaluation: PACU Anesthesia Type: General Level of consciousness: awake and alert, awake and oriented Pain management: pain level controlled Vital Signs Assessment: post-procedure vital signs reviewed and stable Respiratory status: spontaneous breathing, nonlabored ventilation, respiratory function stable and patient connected to nasal cannula oxygen Cardiovascular status: blood pressure returned to baseline and stable Postop Assessment: no apparent nausea or vomiting Anesthetic complications: no   No notable events documented.  Last Vitals:  Vitals:   11/21/20 2330 11/21/20 2345  BP: (!) 154/82 (!) 155/92  Pulse:  65  Resp:  13  Temp:  (!) 36.4 C  SpO2: 98% 96%    Last Pain:  Vitals:   11/21/20 2345  TempSrc:   PainSc: 0-No pain                 Cecile Hearing

## 2020-11-21 NOTE — Transfer of Care (Signed)
Immediate Anesthesia Transfer of Care Note  Patient: Jeff Warren  Procedure(s) Performed: VITRECTOMY 25 GAUGE WITH SCLERAL BUCKLE (Left: Eye) GAS/FLUID EXCHANGE (Left: Eye)  Patient Location: PACU  Anesthesia Type:General  Level of Consciousness: awake, alert  and oriented  Airway & Oxygen Therapy: Patient Spontanous Breathing  Post-op Assessment: Report given to RN and Post -op Vital signs reviewed and stable  Post vital signs: Reviewed and stable  Last Vitals:  Vitals Value Taken Time  BP    Temp    Pulse    Resp    SpO2      Last Pain:  Vitals:   11/21/20 1551  TempSrc:   PainSc: 0-No pain         Complications: No notable events documented.

## 2020-11-21 NOTE — Anesthesia Preprocedure Evaluation (Signed)
Anesthesia Evaluation  Patient identified by MRN, date of birth, ID band Patient awake    Reviewed: Allergy & Precautions, NPO status , Patient's Chart, lab work & pertinent test results, reviewed documented beta blocker date and time   Airway Mallampati: II  TM Distance: >3 FB Neck ROM: Full    Dental  (+) Dental Advisory Given, Missing   Pulmonary sleep apnea ,    Pulmonary exam normal breath sounds clear to auscultation       Cardiovascular hypertension, Pt. on home beta blockers and Pt. on medications + CAD, + Past MI and + Cardiac Stents  Normal cardiovascular exam+ dysrhythmias (Bigeminy)  Rhythm:Regular Rate:Normal     Neuro/Psych negative neurological ROS  negative psych ROS   GI/Hepatic Neg liver ROS, GERD  Medicated and Controlled,  Endo/Other  Obesity   Renal/GU negative Renal ROS     Musculoskeletal negative musculoskeletal ROS (+)   Abdominal   Peds  Hematology negative hematology ROS (+)   Anesthesia Other Findings Day of surgery medications reviewed with the patient.  Reproductive/Obstetrics                             Anesthesia Physical Anesthesia Plan  ASA: 3 and emergent  Anesthesia Plan: General   Post-op Pain Management:    Induction: Intravenous  PONV Risk Score and Plan: 2 and Dexamethasone and Ondansetron  Airway Management Planned: Oral ETT  Additional Equipment:   Intra-op Plan:   Post-operative Plan: Extubation in OR  Informed Consent: I have reviewed the patients History and Physical, chart, labs and discussed the procedure including the risks, benefits and alternatives for the proposed anesthesia with the patient or authorized representative who has indicated his/her understanding and acceptance.     Dental advisory given  Plan Discussed with: CRNA  Anesthesia Plan Comments:         Anesthesia Quick Evaluation

## 2020-11-21 NOTE — H&P (Signed)
Jeff Warren is an 65 y.o. male.   Chief Complaint: vision loss OS  HPI: diagnosed with RD OS  Past Medical History:  Diagnosis Date   BPH (benign prostatic hyperplasia)    CAD (coronary artery disease)    a. inferior ST elevation MI 12/05/16: cardiac cath LM nl, p-mLAD 80%, D1 CTO with right-to-left collats, LCx minor irregs,  m-dRCA 30%, dRCA 100% s/p PCI/DES, EF 45-50%, severely elevated LVEDP; b. staged PCI 12/23/16 - successful PCI/DES to p-mLAD with small diag being jailed by stent (vessel known to be subtotally occluded with R-L collats)   Dyspnea    Family history of premature CAD    a. sister passed from MI at age 71   Gout    HLD (hyperlipidemia)    Hypertension    Myocardial infarction (HCC) 2018   Systolic dysfunction    a. TTE 12/05/16: EF 50-55%, inf HK, GR1DD, mildly dilated LA    Past Surgical History:  Procedure Laterality Date   ABDOMINOPLASTY     COLONOSCOPY WITH PROPOFOL N/A 06/29/2016   Procedure: COLONOSCOPY WITH PROPOFOL;  Surgeon: Charolett Bumpers, MD;  Location: [facility] ENDOSCOPY;  Service: Endoscopy;  Laterality: N/A;   CORONARY STENT INTERVENTION N/A 12/23/2016   Procedure: Coronary Stent Intervention;  Surgeon: Iran Ouch, MD;  Location: [facility] INVASIVE CV LAB;  Service: Cardiovascular;  Laterality: N/A;   CORONARY/GRAFT ACUTE MI REVASCULARIZATION N/A 12/05/2016   Procedure: Coronary/Graft Acute MI Revascularization;  Surgeon: Iran Ouch, MD;  Location: [facility] INVASIVE CV LAB;  Service: Cardiovascular;  Laterality: N/A;   ganglion cyst removal Right    LEFT HEART CATH AND CORONARY ANGIOGRAPHY N/A 12/05/2016   Procedure: Left Heart Cath and Coronary Angiography;  Surgeon: Iran Ouch, MD;  Location: [facility] INVASIVE CV LAB;  Service: Cardiovascular;  Laterality: N/A;   NO PAST SURGERIES     Has has some cosmetic surgeries    Family History  Problem Relation Age of Onset   Diabetes Mellitus II Mother    Cancer Father    Diabetes Mellitus II Sister     Diabetes Mellitus II Brother    Kidney failure Brother    Social History:  reports that he has never smoked. He has never used smokeless tobacco. He reports that he does not drink alcohol and does not use drugs.  Allergies:  Allergies  Allergen Reactions   Bextra [Valdecoxib] Swelling    Medications Prior to Admission  Medication Sig Dispense Refill   Besifloxacin HCl (BESIVANCE) 0.6 % SUSP Place 1 drop into the left eye 4 (four) times daily. Sample     carvedilol (COREG) 6.25 MG tablet Take 6.25 mg by mouth 2 (two) times daily.     Cholecalciferol (VITAMIN D) 125 MCG (5000 UT) CAPS Take 1 capsule by mouth daily.     losartan (COZAAR) 100 MG tablet Take 1 tablet by mouth at bedtime.     prednisoLONE acetate (PRED FORTE) 1 % ophthalmic suspension Place 1 drop into the left eye 4 (four) times daily.     rosuvastatin (CRESTOR) 40 MG tablet Take 1 tablet (40 mg total) by mouth daily. Please call to schedule appointment for further refills. Thank you! (Patient taking differently: Take 40 mg by mouth at bedtime. Please call to schedule appointment for further refills. Thank you!) 30 tablet 0   tamsulosin (FLOMAX) 0.4 MG CAPS capsule Take 0.4 mg by mouth at bedtime.     aspirin 81 MG chewable tablet Chew 1 tablet (81 mg total)  by mouth daily. (Patient not taking: Reported on 11/21/2020)     carvedilol (COREG) 3.125 MG tablet TAKE 1 TABLET BY MOUTH TWO  TIMES DAILY (Patient not taking: No sig reported) 180 tablet 3   ezetimibe (ZETIA) 10 MG tablet Take 1 tablet (10 mg total) by mouth daily. Please schedule office visit for further refills. Thank you! (Patient not taking: No sig reported) 30 tablet 0   furosemide (LASIX) 20 MG tablet Take 1 tablet (20 mg total) by mouth as needed for fluid or edema (Take 1 tablet (20 mg total) once daily as needed for SOB or swelling.). (Patient not taking: No sig reported) 90 tablet 3   losartan (COZAAR) 50 MG tablet TAKE 1 TABLET BY MOUTH  DAILY (Patient not taking:  No sig reported) 90 tablet 1   pantoprazole (PROTONIX) 40 MG tablet Take 1 tablet (40 mg total) by mouth daily as needed. (Patient not taking: Reported on 11/21/2020) 30 tablet 1    Results for orders placed or performed during the hospital encounter of 11/21/20 (from the past 48 hour(s))  Basic metabolic panel     Status: None   Collection Time: 11/21/20  3:34 PM  Result Value Ref Range   Sodium 141 135 - 145 mmol/L   Potassium 4.4 3.5 - 5.1 mmol/L    Comment: SLIGHT HEMOLYSIS   Chloride 106 98 - 111 mmol/L   CO2 23 22 - 32 mmol/L   Glucose, Bld 95 70 - 99 mg/dL    Comment: Glucose reference range applies only to samples taken after fasting for at least 8 hours.   BUN 14 8 - 23 mg/dL   Creatinine, Ser 6.96 0.61 - 1.24 mg/dL   Calcium 8.9 8.9 - 29.5 mg/dL   GFR, Estimated >28 >41 mL/min    Comment: (NOTE) Calculated using the CKD-EPI Creatinine Equation (2021)    Anion gap 12 5 - 15    Comment: Performed at [facility], [address], [place], [place] [address]   No results found.  Review of Systems  Constitutional: Negative.   Eyes:  Positive for visual disturbance.  All other systems reviewed and are negative.  Blood pressure 139/81, pulse 67, temperature 98.6 F (37 C), temperature source Oral, resp. rate 18, height 6\' 1"  (1.854 m), weight 116.1 kg, SpO2 96 %. Physical Exam Constitutional:      Appearance: Normal appearance. He is normal weight.  Eyes:   Neurological:     Mental Status: He is alert.     Assessment/Plan RD OS: PPV/SBP/GFX OS  , MD 11/21/2020, 6:24 PM

## 2020-11-21 NOTE — Anesthesia Procedure Notes (Signed)
Procedure Name: Intubation Date/Time: 11/21/2020 8:53 PM Performed by: Mayer Camel, CRNA Pre-anesthesia Checklist: Patient identified, Emergency Drugs available, Suction available and Patient being monitored Patient Re-evaluated:Patient Re-evaluated prior to induction Oxygen Delivery Method: Circle System Utilized Preoxygenation: Pre-oxygenation with 100% oxygen Induction Type: IV induction Ventilation: Mask ventilation without difficulty and Oral airway inserted - appropriate to patient size Laryngoscope Size: Hyacinth Meeker and 3 Grade View: Grade II Tube type: Oral Tube size: 7.5 mm Number of attempts: 1 Airway Equipment and Method: Stylet and Oral airway Placement Confirmation: ETT inserted through vocal cords under direct vision, positive ETCO2 and breath sounds checked- equal and bilateral Secured at: 22 cm Tube secured with: Tape Dental Injury: Teeth and Oropharynx as per pre-operative assessment

## 2020-11-22 ENCOUNTER — Emergency Department
Admission: EM | Admit: 2020-11-22 | Discharge: 2020-11-22 | Disposition: A | Discharge status: Home / Self Care | Payer: Medicare Other | Attending: Emergency Medicine | Admitting: Emergency Medicine

## 2020-11-22 ENCOUNTER — Other Ambulatory Visit: Payer: Self-pay

## 2020-11-22 DIAGNOSIS — Z79899 Other long term (current) drug therapy: Secondary | ICD-10-CM | POA: Insufficient documentation

## 2020-11-22 DIAGNOSIS — R1030 Lower abdominal pain, unspecified: Secondary | ICD-10-CM | POA: Diagnosis present

## 2020-11-22 DIAGNOSIS — R300 Dysuria: Secondary | ICD-10-CM

## 2020-11-22 DIAGNOSIS — I2510 Atherosclerotic heart disease of native coronary artery without angina pectoris: Secondary | ICD-10-CM | POA: Diagnosis not present

## 2020-11-22 DIAGNOSIS — I10 Essential (primary) hypertension: Secondary | ICD-10-CM | POA: Insufficient documentation

## 2020-11-22 DIAGNOSIS — R339 Retention of urine, unspecified: Secondary | ICD-10-CM | POA: Insufficient documentation

## 2020-11-22 LAB — URINALYSIS, COMPLETE (UACMP) WITH MICROSCOPIC
Bacteria, UA: NONE SEEN
Bilirubin Urine: NEGATIVE
Glucose, UA: NEGATIVE mg/dL
Hgb urine dipstick: NEGATIVE
Ketones, ur: NEGATIVE mg/dL
Leukocytes,Ua: NEGATIVE
Nitrite: NEGATIVE
Protein, ur: NEGATIVE mg/dL
Specific Gravity, Urine: 1.015 (ref 1.005–1.030)
Squamous Epithelial / HPF: NONE SEEN (ref 0–5)
pH: 6 (ref 5.0–8.0)

## 2020-11-22 MED ORDER — PHENAZOPYRIDINE HCL 95 MG PO TABS: 95.0000 mg | ORAL_TABLET | Freq: Three times a day (TID) | ORAL | 0 refills | Status: DC | PRN

## 2020-11-22 MED ORDER — PHENAZOPYRIDINE HCL 200 MG PO TABS
200.0000 mg | ORAL_TABLET | Freq: Once | ORAL | Status: AC
  Administered 2020-11-22: 200 mg via ORAL
  Filled 2020-11-22: qty 1

## 2020-11-22 NOTE — ED Notes (Signed)
33ml in bladder max found

## 2020-11-22 NOTE — Op Note (Signed)
Jeff Warren, Jeff Warren MEDICAL RECORD NO: [id number] ACCOUNT NO: [id number] DATE OF BIRTH: 1955/08/05 FACILITY: [facility] LOCATION: [facility] PHYSICIAN: Baron Hamper. Allyne Gee, MD  Operative Report   DATE OF PROCEDURE: 11/21/2020  SURGEON:  Stephannie Li, MD  ANESTHESIA:  General with endotracheal intubation.  PREOPERATIVE DIAGNOSIS:  Retinal detachment, left eye.  POSTOPERATIVE DIAGNOSIS:  Retinal detachment, left eye.  OPERATION:  Retinal detachment repair of the left eye with a scleral buckle and a pars plana vitrectomy with endocautery endolaser and gas fluid exchange using 14% C3F8.  COMPLICATIONS:  None.  FINDINGS:  There was an inferior macula-on retinal detachment of the left eye.  DESCRIPTION OF PROCEDURE:  The patient was identified in the preoperative holding area.  A signed consent form was placed on the chart for surgery on the left eye.  He was then taken to the operating room where he was sedated and placed under general  anesthesia by the anesthesia team.  At that point in time, after a timeout had been performed, the left eye was anesthetized using a retrobulbar block consisting of a 1:1 mixture of 0.75% bupivacaine and 1% lidocaine.  After the retrobulbar needle was  used to perform the block, it was removed.  The left eye was then prepped and draped in the usual sterile fashion for ocular surgery.  A Lieberman speculum was placed between the left upper and lower eyelids for exposure.  A 360 degree conjunctival  peritomy was performed with 0.3 forceps and Westcott scissors to prepare the eye for a scleral buckle.  Stevens tenotomy scissors were used to dissect Tenon's capsule off the sclera in all 4 quadrants.  The four recti muscles were isolated and looped  using 2-0 silk sutures.  Calipers were set on 4.0 mm and used to mark locations of the horizontal mattress suture placement, which would be located in each of the oblique quadrants.  Next, 5-0 nylon sutures were placed in each of  the oblique quadrants in  a horizontal mattress fashion.  The eye was then encircled with a #41 scleral buckle with the ends of the buckle joined by a sleeve in the inferior nasal quadrant.  The buckle height was adjusted and the 5-0 nylon sutures were tied.  The knots were  rotated posteriorly and trimmed.  Excess buckle material and suture material were removed and discarded.  Next, attention was directed towards the vitrectomy portion of the case.  25 gauge trocars were used to introduce transscleral cannulas in the  inferotemporal, superotemporal, and superonasal quadrants.  The trocars were removed, leaving the cannulas in place.  An infusion cannula was attached to the inferotemporal location and confirmed to be in the vitreous cavity prior to its use.  Once the  infusion cannula was confirmed to be in the vitreous cavity, it was turned on.  The eye then underwent a core vitrectomy with the vitreous cutter and light pipe.  The vitreous was trimmed out to the periphery.  The eye was inspected with scleral  depression.  A single retinal break was identified in the inferior temporal quadrant.  This retinal break was marked using endocautery for identification later in the case.  Next, a soft tip extrusion cannula was used to perform an air fluid exchange.   Residual posterior subretinal fluid was identified.  A small retinotomy was used to create access to the subretinal fluid by using endocautery.  The endocautery probe was removed and a soft tip extrusion cannula was used to access and remove all  subretinal fluid.  Endolaser photocoagulation was then placed around the retinal break and the retinotomy.  Once this was completed, the eye was flushed with a 14% concentration of C3F8 gas.  Next cannulas were removed from the sclerotomies.  The  sclerotomies were inspected and found to be airtight.  Following this, the 2-0 silk sutures were removed from their muscle insertions.  Tenon's capsule and  conjunctiva were reapproximated to the limbus using 7-0 Vicryl suture.  The eye was then treated  with subconjunctival injections of 15 mg of Ancef and 1 mg of dexamethasone.  The eye was treated topically with 1 drop of 1% atropine and TobraDex ointment.  The speculum was removed.  Eyelids were cleaned and closed, they were then patched and  shielded.  The patient was then taken to recovery in excellent condition, having tolerated the procedure well.   SHW D: 11/21/2020 11:14:21 pm T: 11/22/2020 12:26:00 am  JOB: [id number]/ [id number]

## 2020-11-22 NOTE — ED Triage Notes (Signed)
Had eye surgery today at 9PM-discharged at 12AM. Has not been able to pee normally since. Hx of BPH, takes flomax. Reports "dribble" and discomfort.

## 2020-11-22 NOTE — ED Provider Notes (Signed)
[facility] Emergency Department Provider Note   ____________________________________________   Event Date/Time   First MD Initiated Contact with Patient 11/22/20 [phone]     (approximate)  I have reviewed the triage vital signs and the nursing notes.   HISTORY  Chief Complaint Urinary Retention    HPI Jeff Warren is a 65 y.o. male presents for suprapubic abdominal pain and the sensation of urinary retention after ophthalmologic procedure approximately 4 hours prior to arrival.  Patient describes 8/10, burning, nonradiating suprapubic abdominal pain and that he has only been able to "dribble some" but has been unable to have a sensation of fully emptying his bladder.  Patient denies any exacerbating or relieving factors but does state that this pain waxes and wanes.  Patient currently denies any vision changes, tinnitus, difficulty speaking, facial droop, sore throat, chest pain, shortness of breath, nausea/vomiting/diarrhea, or weakness/numbness/paresthesias in any extremity         Past Medical History:  Diagnosis Date   BPH (benign prostatic hyperplasia)    CAD (coronary artery disease)    a. inferior ST elevation MI 12/05/16: cardiac cath LM nl, p-mLAD 80%, D1 CTO with right-to-left collats, LCx minor irregs,  m-dRCA 30%, dRCA 100% s/p PCI/DES, EF 45-50%, severely elevated LVEDP; b. staged PCI 12/23/16 - successful PCI/DES to p-mLAD with small diag being jailed by stent (vessel known to be subtotally occluded with R-L collats)   Dyspnea    Family history of premature CAD    a. sister passed from MI at age 44   Gout    HLD (hyperlipidemia)    Hypertension    Myocardial infarction (HCC) 2018   Systolic dysfunction    a. TTE 12/05/16: EF 50-55%, inf HK, GR1DD, mildly dilated LA    Patient Active Problem List   Diagnosis Date Noted   Coronary artery disease    CAD in native artery 12/14/2016   Systolic dysfunction 12/14/2016   Essential hypertension  12/14/2016   Mixed hyperlipidemia 12/14/2016   Family history of premature CAD 12/14/2016   Acute ST elevation myocardial infarction (STEMI) involving right coronary artery [facility])     Past Surgical History:  Procedure Laterality Date   ABDOMINOPLASTY     COLONOSCOPY WITH PROPOFOL N/A 06/29/2016   Procedure: COLONOSCOPY WITH PROPOFOL;  Surgeon: Charolett Bumpers, MD;  Location: [facility];  Service: Endoscopy;  Laterality: N/A;   CORONARY STENT INTERVENTION N/A 12/23/2016   Procedure: Coronary Stent Intervention;  Surgeon: Iran Ouch, MD;  Location: [facility] INVASIVE CV LAB;  Service: Cardiovascular;  Laterality: N/A;   CORONARY/GRAFT ACUTE MI REVASCULARIZATION N/A 12/05/2016   Procedure: Coronary/Graft Acute MI Revascularization;  Surgeon: Iran Ouch, MD;  Location: [facility] INVASIVE CV LAB;  Service: Cardiovascular;  Laterality: N/A;   ganglion cyst removal Right    GAS INSERTION Left 11/21/2020   Procedure: INSERTION OF GAS LEFT EYE (C3F8);  Surgeon: Stephannie Li, MD;  Location: [facility] OR;  Service: Ophthalmology;  Laterality: Left;   GAS/FLUID EXCHANGE Left 11/21/2020   Procedure: GAS/FLUID EXCHANGE;  Surgeon: Stephannie Li, MD;  Location: [facility] OR;  Service: Ophthalmology;  Laterality: Left;   LEFT HEART CATH AND CORONARY ANGIOGRAPHY N/A 12/05/2016   Procedure: Left Heart Cath and Coronary Angiography;  Surgeon: Iran Ouch, MD;  Location: [facility] INVASIVE CV LAB;  Service: Cardiovascular;  Laterality: N/A;   NO PAST SURGERIES     Has has some cosmetic surgeries   VITRECTOMY 25 GAUGE WITH SCLERAL BUCKLE Left 11/21/2020  Procedure: VITRECTOMY 25 GAUGE WITH SCLERAL BUCKLE;  Surgeon: Stephannie Li, MD;  Location: [facility] OR;  Service: Ophthalmology;  Laterality: Left;    Prior to Admission medications   Medication Sig Start Date End Date Taking? Authorizing Provider  phenazopyridine (PYRIDIUM) 95 MG tablet Take 1 tablet (95 mg total) by mouth 3 (three) times daily as needed for pain. 11/22/20   Yes Merwyn Katos, MD  Besifloxacin HCl (BESIVANCE) 0.6 % SUSP Place 1 drop into the left eye 4 (four) times daily. Sample    [provider]  carvedilol (COREG) 6.25 MG tablet Take 6.25 mg by mouth 2 (two) times daily. 09/04/20   [provider]  Cholecalciferol (VITAMIN D) 125 MCG (5000 UT) CAPS Take 1 capsule by mouth daily.    [provider]  losartan (COZAAR) 100 MG tablet Take 1 tablet by mouth at bedtime. 11/07/20   [provider]  prednisoLONE acetate (PRED FORTE) 1 % ophthalmic suspension Place 1 drop into the left eye 4 (four) times daily. 11/06/20   [provider]  rosuvastatin (CRESTOR) 40 MG tablet Take 1 tablet (40 mg total) by mouth daily. Please call to schedule appointment for further refills. Thank you! Patient taking differently: Take 40 mg by mouth at bedtime. Please call to schedule appointment for further refills. Thank you! 12/21/19   Iran Ouch, MD  tamsulosin (FLOMAX) 0.4 MG CAPS capsule Take 0.4 mg by mouth at bedtime.    [provider]    Allergies Bextra [valdecoxib]  Family History  Problem Relation Age of Onset   Diabetes Mellitus II Mother    Cancer Father    Diabetes Mellitus II Sister    Diabetes Mellitus II Brother    Kidney failure Brother     Social History Social History   Tobacco Use   Smoking status: Never   Smokeless tobacco: Never  Vaping Use   Vaping Use: Never used  Substance Use Topics   Alcohol use: No   Drug use: No    Review of Systems Constitutional: No fever/chills Eyes: No visual changes. ENT: No sore throat. Cardiovascular: Denies chest pain. Respiratory: Denies shortness of breath. Gastrointestinal: Endorses abdominal pain.  No nausea, no vomiting.  No diarrhea. Genitourinary: Endorses sensation of incomplete void Musculoskeletal: Negative for acute arthralgias Skin: Negative for rash. Neurological: Negative for headaches, weakness/numbness/paresthesias in any  extremity Psychiatric: Negative for suicidal ideation/homicidal ideation   ____________________________________________   PHYSICAL EXAM:  VITAL SIGNS: ED Triage Vitals [11/22/20 0511]  Enc Vitals Group     BP (!) 149/97     Pulse Rate 79     Resp 16     Temp 97.8 F (36.6 C)     Temp Source Oral     SpO2 97 %     Weight 255 lb 11.7 oz (116 kg)     Height 6\' 1"  (1.854 m)     Head Circumference      Peak Flow      Pain Score 8     Pain Loc      Pain Edu?      Excl. in GC?    Constitutional: Alert and oriented. Well appearing and in no acute distress. Eyes: Conjunctivae are normal. PERRL. Head: Atraumatic. Nose: No congestion/rhinnorhea. Mouth/Throat: Mucous membranes are moist. Neck: No stridor Cardiovascular: Grossly normal heart sounds.  Good peripheral circulation. Respiratory: Normal respiratory effort.  No retractions. Gastrointestinal: Soft and nontender. No distention. Musculoskeletal: No obvious deformities Neurologic:  Normal speech and  language. No gross focal neurologic deficits are appreciated. Skin:  Skin is warm and dry. No rash noted. Psychiatric: Mood and affect are normal. Speech and behavior are normal.  ____________________________________________   LABS (all labs ordered are listed, but only abnormal results are displayed)  Labs Reviewed  URINALYSIS, COMPLETE (UACMP) WITH MICROSCOPIC - Abnormal; Notable for the following components:      Result Value   Color, Urine YELLOW (*)    APPearance CLEAR (*)    All other components within normal limits   PROCEDURES  Procedure(s) performed (including Critical Care):  Procedures   ____________________________________________   INITIAL IMPRESSION / ASSESSMENT AND PLAN / ED COURSE  As part of my medical decision making, I reviewed the following data within the electronic MEDICAL RECORD NUMBER Nursing notes reviewed and incorporated, Labs reviewed, EKG interpreted, Old chart reviewed, Radiograph  reviewed and Notes from prior ED visits reviewed and incorporated        65 year old male with one day of acute dysuria after a surgical procedure just prior to arrival. Well appearing/nonseptic. Tolerating PO.  ED Workup: UA ED Interventions: Bladder scan shows minimal urine in the bladder  Patient exam and history not consistent with cauda equina, infectious etiology, constipation based retention/intraabdominal mass/AAA, trauma, nephro/urolithiasis, drug reaction, cancer.  Symptoms improved after Azo administration.  Will give patient prescription for Azo as patient's dysuria likely secondary to Foley catheter that was placed during his procedure.  Disposition: Discharge with urology follow up within 1 week. Strict return precautions and catheter care discussed.      ____________________________________________   FINAL CLINICAL IMPRESSION(S) / ED DIAGNOSES  Final diagnoses:  Dysuria     ED Discharge Orders          Ordered    phenazopyridine (PYRIDIUM) 95 MG tablet  3 times daily PRN        11/22/20 0707             Note:  This document was prepared using Dragon voice recognition software and may include unintentional dictation errors.    Merwyn Katos, MD 11/23/20 [phone]

## 2020-11-26 NOTE — Telephone Encounter (Signed)
Left message to schedule past due follow up

## 2020-12-03 ENCOUNTER — Encounter (HOSPITAL_COMMUNITY): Payer: Self-pay | Admitting: Emergency Medicine

## 2020-12-03 ENCOUNTER — Emergency Department (HOSPITAL_COMMUNITY): Payer: Medicare Other

## 2020-12-03 ENCOUNTER — Other Ambulatory Visit: Payer: Self-pay

## 2020-12-03 ENCOUNTER — Inpatient Hospital Stay (HOSPITAL_COMMUNITY)
Admission: EM | Admit: 2020-12-03 | Discharge: 2020-12-05 | DRG: 698 | Disposition: A | Discharge status: Home / Self Care | Payer: Medicare Other | Attending: Internal Medicine | Admitting: Internal Medicine

## 2020-12-03 DIAGNOSIS — R6520 Severe sepsis without septic shock: Secondary | ICD-10-CM | POA: Diagnosis not present

## 2020-12-03 DIAGNOSIS — R17 Unspecified jaundice: Secondary | ICD-10-CM | POA: Diagnosis present

## 2020-12-03 DIAGNOSIS — I252 Old myocardial infarction: Secondary | ICD-10-CM

## 2020-12-03 DIAGNOSIS — Z79899 Other long term (current) drug therapy: Secondary | ICD-10-CM

## 2020-12-03 DIAGNOSIS — A419 Sepsis, unspecified organism: Secondary | ICD-10-CM | POA: Diagnosis present

## 2020-12-03 DIAGNOSIS — M109 Gout, unspecified: Secondary | ICD-10-CM | POA: Diagnosis present

## 2020-12-03 DIAGNOSIS — E871 Hypo-osmolality and hyponatremia: Secondary | ICD-10-CM | POA: Diagnosis present

## 2020-12-03 DIAGNOSIS — I2510 Atherosclerotic heart disease of native coronary artery without angina pectoris: Secondary | ICD-10-CM | POA: Diagnosis present

## 2020-12-03 DIAGNOSIS — I10 Essential (primary) hypertension: Secondary | ICD-10-CM | POA: Diagnosis present

## 2020-12-03 DIAGNOSIS — K219 Gastro-esophageal reflux disease without esophagitis: Secondary | ICD-10-CM | POA: Diagnosis present

## 2020-12-03 DIAGNOSIS — N401 Enlarged prostate with lower urinary tract symptoms: Secondary | ICD-10-CM | POA: Diagnosis present

## 2020-12-03 DIAGNOSIS — Y846 Urinary catheterization as the cause of abnormal reaction of the patient, or of later complication, without mention of misadventure at the time of the procedure: Secondary | ICD-10-CM | POA: Diagnosis present

## 2020-12-03 DIAGNOSIS — Z20822 Contact with and (suspected) exposure to covid-19: Secondary | ICD-10-CM | POA: Diagnosis present

## 2020-12-03 DIAGNOSIS — T83518A Infection and inflammatory reaction due to other urinary catheter, initial encounter: Principal | ICD-10-CM | POA: Diagnosis present

## 2020-12-03 DIAGNOSIS — Z8249 Family history of ischemic heart disease and other diseases of the circulatory system: Secondary | ICD-10-CM

## 2020-12-03 DIAGNOSIS — R030 Elevated blood-pressure reading, without diagnosis of hypertension: Secondary | ICD-10-CM | POA: Insufficient documentation

## 2020-12-03 DIAGNOSIS — N390 Urinary tract infection, site not specified: Secondary | ICD-10-CM | POA: Diagnosis present

## 2020-12-03 DIAGNOSIS — Z955 Presence of coronary angioplasty implant and graft: Secondary | ICD-10-CM

## 2020-12-03 DIAGNOSIS — Z888 Allergy status to other drugs, medicaments and biological substances status: Secondary | ICD-10-CM

## 2020-12-03 DIAGNOSIS — E669 Obesity, unspecified: Secondary | ICD-10-CM | POA: Diagnosis present

## 2020-12-03 DIAGNOSIS — G9341 Metabolic encephalopathy: Secondary | ICD-10-CM | POA: Diagnosis present

## 2020-12-03 DIAGNOSIS — E038 Other specified hypothyroidism: Secondary | ICD-10-CM | POA: Insufficient documentation

## 2020-12-03 DIAGNOSIS — E860 Dehydration: Secondary | ICD-10-CM | POA: Diagnosis present

## 2020-12-03 DIAGNOSIS — Z6832 Body mass index (BMI) 32.0-32.9, adult: Secondary | ICD-10-CM

## 2020-12-03 DIAGNOSIS — I519 Heart disease, unspecified: Secondary | ICD-10-CM | POA: Diagnosis present

## 2020-12-03 DIAGNOSIS — E039 Hypothyroidism, unspecified: Secondary | ICD-10-CM | POA: Insufficient documentation

## 2020-12-03 DIAGNOSIS — N179 Acute kidney failure, unspecified: Secondary | ICD-10-CM | POA: Diagnosis present

## 2020-12-03 DIAGNOSIS — E782 Mixed hyperlipidemia: Secondary | ICD-10-CM | POA: Diagnosis present

## 2020-12-03 DIAGNOSIS — N138 Other obstructive and reflux uropathy: Secondary | ICD-10-CM | POA: Diagnosis present

## 2020-12-03 HISTORY — DX: Sepsis, unspecified organism: R65.20

## 2020-12-03 HISTORY — DX: Severe sepsis without septic shock: A41.9

## 2020-12-03 LAB — URINALYSIS, ROUTINE W REFLEX MICROSCOPIC
Bilirubin Urine: NEGATIVE
Glucose, UA: NEGATIVE mg/dL
Ketones, ur: NEGATIVE mg/dL
Nitrite: POSITIVE — AB
Protein, ur: 100 mg/dL — AB
Specific Gravity, Urine: 1.011 (ref 1.005–1.030)
WBC, UA: >50 WBC/hpf — ABNORMAL HIGH (ref 0–5)
pH: 6 (ref 5.0–8.0)

## 2020-12-03 LAB — CBC WITH DIFFERENTIAL/PLATELET
Abs Immature Granulocytes: 0.14 10*3/uL — ABNORMAL HIGH (ref 0.00–0.07)
Basophils Absolute: 0.1 10*3/uL (ref 0.0–0.1)
Basophils Relative: 0 %
Eosinophils Absolute: 0 10*3/uL (ref 0.0–0.5)
Eosinophils Relative: 0 %
HCT: 48.4 % (ref 39.0–52.0)
Hemoglobin: 17.1 g/dL — ABNORMAL HIGH (ref 13.0–17.0)
Immature Granulocytes: 1 %
Lymphocytes Relative: 6 %
Lymphs Abs: 1.2 10*3/uL (ref 0.7–4.0)
MCH: 32.3 pg (ref 26.0–34.0)
MCHC: 35.3 g/dL (ref 30.0–36.0)
MCV: 91.5 fL (ref 80.0–100.0)
Monocytes Absolute: 1.4 10*3/uL — ABNORMAL HIGH (ref 0.1–1.0)
Monocytes Relative: 6 %
Neutro Abs: 19.6 10*3/uL — ABNORMAL HIGH (ref 1.7–7.7)
Neutrophils Relative %: 87 %
Platelets: 159 10*3/uL (ref 150–400)
RBC: 5.29 MIL/uL (ref 4.22–5.81)
RDW: 12.4 % (ref 11.5–15.5)
WBC: 22.4 10*3/uL — ABNORMAL HIGH (ref 4.0–10.5)
nRBC: 0 % (ref 0.0–0.2)

## 2020-12-03 LAB — COMPREHENSIVE METABOLIC PANEL
ALT: 26 U/L (ref 0–44)
AST: 25 U/L (ref 15–41)
Albumin: 3.7 g/dL (ref 3.5–5.0)
Alkaline Phosphatase: 77 U/L (ref 38–126)
Anion gap: 8 (ref 5–15)
BUN: 15 mg/dL (ref 8–23)
CO2: 24 mmol/L (ref 22–32)
Calcium: 9.1 mg/dL (ref 8.9–10.3)
Chloride: 99 mmol/L (ref 98–111)
Creatinine, Ser: 1.41 mg/dL — ABNORMAL HIGH (ref 0.61–1.24)
GFR, Estimated: 55 mL/min — ABNORMAL LOW (ref >60)
Glucose, Bld: 121 mg/dL — ABNORMAL HIGH (ref 70–99)
Potassium: 3.9 mmol/L (ref 3.5–5.1)
Sodium: 131 mmol/L — ABNORMAL LOW (ref 135–145)
Total Bilirubin: 2 mg/dL — ABNORMAL HIGH (ref 0.3–1.2)
Total Protein: 7.2 g/dL (ref 6.5–8.1)

## 2020-12-03 LAB — PROTIME-INR
INR: 1.2 (ref 0.8–1.2)
Prothrombin Time: 15.1 seconds (ref 11.4–15.2)

## 2020-12-03 LAB — RESP PANEL BY RT-PCR (FLU A&B, COVID) ARPGX2
Influenza A by PCR: NEGATIVE
Influenza B by PCR: NEGATIVE
SARS Coronavirus 2 by RT PCR: NEGATIVE

## 2020-12-03 LAB — LACTIC ACID, PLASMA: Lactic Acid, Venous: 1.6 mmol/L (ref 0.5–1.9)

## 2020-12-03 LAB — APTT: aPTT: 33 seconds (ref 24–36)

## 2020-12-03 MED ORDER — ACETAMINOPHEN 325 MG PO TABS
650.0000 mg | ORAL_TABLET | Freq: Once | ORAL | Status: AC | PRN
  Administered 2020-12-03: 650 mg via ORAL
  Filled 2020-12-03: qty 2

## 2020-12-03 MED ORDER — POTASSIUM GLUCONATE 595 MG PO CAPS: 1.0000 | ORAL_CAPSULE | Freq: Every day | ORAL | Status: DC | PRN

## 2020-12-03 MED ORDER — SODIUM CHLORIDE 0.9 % IV SOLN: 1.0000 g | Freq: Once | INTRAVENOUS | Status: DC

## 2020-12-03 MED ORDER — SODIUM CHLORIDE 0.9 % IV SOLN
1.0000 g | Freq: Once | INTRAVENOUS | Status: AC
  Administered 2020-12-03: 1 g via INTRAVENOUS
  Filled 2020-12-03: qty 10

## 2020-12-03 MED ORDER — ASPIRIN 81 MG PO CHEW
81.0000 mg | CHEWABLE_TABLET | Freq: Every day | ORAL | Status: DC
  Administered 2020-12-04 – 2020-12-05 (×2): 81 mg via ORAL
  Filled 2020-12-03 (×2): qty 1

## 2020-12-03 MED ORDER — VITAMIN D 25 MCG (1000 UNIT) PO TABS
5000.0000 [IU] | ORAL_TABLET | Freq: Every day | ORAL | Status: DC
  Administered 2020-12-04 – 2020-12-05 (×2): 5000 [IU] via ORAL
  Filled 2020-12-03 (×2): qty 5

## 2020-12-03 MED ORDER — SODIUM CHLORIDE 0.9 % IV BOLUS
500.0000 mL | Freq: Once | INTRAVENOUS | Status: AC
  Administered 2020-12-03: 500 mL via INTRAVENOUS

## 2020-12-03 MED ORDER — PANTOPRAZOLE SODIUM 40 MG PO TBEC
40.0000 mg | DELAYED_RELEASE_TABLET | Freq: Every day | ORAL | Status: DC
  Administered 2020-12-04 – 2020-12-05 (×2): 40 mg via ORAL
  Filled 2020-12-03 (×2): qty 1

## 2020-12-03 MED ORDER — CARVEDILOL 6.25 MG PO TABS
6.2500 mg | ORAL_TABLET | Freq: Two times a day (BID) | ORAL | Status: DC
  Administered 2020-12-04: 6.25 mg via ORAL
  Filled 2020-12-03: qty 1

## 2020-12-03 MED ORDER — INDOMETHACIN 25 MG PO CAPS: 50.0000 mg | ORAL_CAPSULE | Freq: Two times a day (BID) | ORAL | Status: DC | PRN

## 2020-12-03 MED ORDER — LOSARTAN POTASSIUM 50 MG PO TABS
100.0000 mg | ORAL_TABLET | Freq: Every day | ORAL | Status: DC
  Administered 2020-12-04: 100 mg via ORAL
  Filled 2020-12-03: qty 2

## 2020-12-03 MED ORDER — PREDNISOLONE ACETATE 1 % OP SUSP
1.0000 [drp] | Freq: Three times a day (TID) | OPHTHALMIC | Status: DC
  Administered 2020-12-04 – 2020-12-05 (×5): 1 [drp] via OPHTHALMIC
  Filled 2020-12-03 (×2): qty 5

## 2020-12-03 MED ORDER — PHENAZOPYRIDINE HCL 100 MG PO TABS
95.0000 mg | ORAL_TABLET | Freq: Three times a day (TID) | ORAL | Status: DC | PRN
  Administered 2020-12-04: 100 mg via ORAL
  Filled 2020-12-03 (×2): qty 1

## 2020-12-03 MED ORDER — FLUTICASONE PROPIONATE 50 MCG/ACT NA SUSP
1.0000 | Freq: Every day | NASAL | Status: DC
  Administered 2020-12-04 (×2): 1 via NASAL
  Filled 2020-12-03: qty 16

## 2020-12-03 MED ORDER — CALCIUM-MAGNESIUM-ZINC-D3 PO TABS: ORAL_TABLET | Freq: Every day | ORAL | Status: DC | PRN

## 2020-12-03 MED ORDER — TAMSULOSIN HCL 0.4 MG PO CAPS
0.4000 mg | ORAL_CAPSULE | Freq: Every day | ORAL | Status: DC
  Administered 2020-12-04 (×2): 0.4 mg via ORAL
  Filled 2020-12-03: qty 1

## 2020-12-03 MED ORDER — ACETAMINOPHEN 500 MG PO TABS
500.0000 mg | ORAL_TABLET | Freq: Four times a day (QID) | ORAL | Status: DC | PRN
  Administered 2020-12-03: 500 mg via ORAL
  Filled 2020-12-03: qty 1

## 2020-12-03 MED ORDER — ROSUVASTATIN CALCIUM 20 MG PO TABS
40.0000 mg | ORAL_TABLET | Freq: Every day | ORAL | Status: DC
  Administered 2020-12-04 (×2): 40 mg via ORAL
  Filled 2020-12-03 (×2): qty 2

## 2020-12-03 NOTE — H&P (Signed)
History and Physical   Jeff Warren KGM:[id number] DOB: [birth year] DOA: 12/03/2020  Referring MD/NP/PA: Dr Madilyn Hook  PCP: [name], MD   Outpatient Specialists: [facility]   Patient coming from: Home  Chief Complaint: Altered mental status  HPI: Jeff Warren is a 65 y.o. male with medical history significant of BPH status post insertion of Foley catheter last week, essential hypertension, coronary artery disease, gout, who had Foley catheter inserted last week and is supposed to be returning this week for removal possibly.  Patient came to the ER due to some altered mental status.  He is here with his partner who reported patient confused.  Evaluation in the ER showed evidence of UTI.  He is improving since arrival in the ER and getting treated.  No fever or chills.  No nausea vomiting or diarrhea.  Due to his transient confusion and the fact that he is has a Foley in place patient is being admitted for observation and IV antibiotics therapy..  ED Course: Temperature 102.9, blood pressure 193/76, pulse 90 respirate of 25 oxygen sat 92% on room air.  White count 22.4 hemoglobin 17.1 and platelets 159.  Sodium 131 potassium 3.9 chloride 99 CO2 24 BUN 15 creatinine 1.4 and calcium 9.1.  Influenza and COVID-19 screen negative.  Chest x-ray showed no acute findings.  EKG shows sinus tachycardia.  Multiple interventricular conduction delays.  Urinalysis showed amber urine with positive nitrite, large leukocytes, many bacteria and WBC more than 50.  RBCs 21-50.  Patient being admitted with severe sepsis as a result of UTI  Review of Systems: As per HPI otherwise 10 point review of systems negative.    Past Medical History:  Diagnosis Date   BPH (benign prostatic hyperplasia)    CAD (coronary artery disease)    a. inferior ST elevation MI 12/05/16: cardiac cath LM nl, p-mLAD 80%, D1 CTO with right-to-left collats, LCx minor irregs,  m-dRCA 30%, dRCA 100% s/p PCI/DES, EF 45-50%, severely elevated  LVEDP; b. staged PCI 12/23/16 - successful PCI/DES to p-mLAD with small diag being jailed by stent (vessel known to be subtotally occluded with R-L collats)   Dyspnea    Family history of premature CAD    a. sister passed from MI at age 15   Gout    HLD (hyperlipidemia)    Hypertension    Myocardial infarction (HCC) 2018   Systolic dysfunction    a. TTE 12/05/16: EF 50-55%, inf HK, GR1DD, mildly dilated LA    Past Surgical History:  Procedure Laterality Date   ABDOMINOPLASTY     COLONOSCOPY WITH PROPOFOL N/A 06/29/2016   Procedure: COLONOSCOPY WITH PROPOFOL;  Surgeon: Charolett Bumpers, MD;  Location: [facility];  Service: Endoscopy;  Laterality: N/A;   CORONARY STENT INTERVENTION N/A 12/23/2016   Procedure: Coronary Stent Intervention;  Surgeon: Iran Ouch, MD;  Location: [facility] LAB;  Service: Cardiovascular;  Laterality: N/A;   CORONARY/GRAFT ACUTE MI REVASCULARIZATION N/A 12/05/2016   Procedure: Coronary/Graft Acute MI Revascularization;  Surgeon: Iran Ouch, MD;  Location: [facility] INVASIVE CV LAB;  Service: Cardiovascular;  Laterality: N/A;   ganglion cyst removal Right    GAS INSERTION Left 11/21/2020   Procedure: INSERTION OF GAS LEFT EYE (C3F8);  Surgeon: Stephannie Li, MD;  Location: [facility] OR;  Service: Ophthalmology;  Laterality: Left;   GAS/FLUID EXCHANGE Left 11/21/2020   Procedure: GAS/FLUID EXCHANGE;  Surgeon: Stephannie Li, MD;  Location: [facility] OR;  Service: Ophthalmology;  Laterality: Left;   LEFT  HEART CATH AND CORONARY ANGIOGRAPHY N/A 12/05/2016   Procedure: Left Heart Cath and Coronary Angiography;  Surgeon: Iran Ouch, MD;  Location: [facility] INVASIVE CV LAB;  Service: Cardiovascular;  Laterality: N/A;   NO PAST SURGERIES     Has has some cosmetic surgeries   VITRECTOMY 25 GAUGE WITH SCLERAL BUCKLE Left 11/21/2020   Procedure: VITRECTOMY 25 GAUGE WITH SCLERAL BUCKLE;  Surgeon: Stephannie Li, MD;  Location: [facility] OR;  Service: Ophthalmology;  Laterality: Left;      reports that he has never smoked. He has never used smokeless tobacco. He reports that he does not drink alcohol and does not use drugs.  Allergies  Allergen Reactions   Bextra [Valdecoxib] Swelling    Family History  Problem Relation Age of Onset   Diabetes Mellitus II Mother    Cancer Father    Diabetes Mellitus II Sister    Diabetes Mellitus II Brother    Kidney failure Brother      Prior to Admission medications   Medication Sig Start Date End Date Taking? Authorizing Provider  Besifloxacin HCl (BESIVANCE) 0.6 % SUSP Place 1 drop into the left eye 4 (four) times daily. Sample    [provider]  carvedilol (COREG) 6.25 MG tablet Take 6.25 mg by mouth 2 (two) times daily. 09/04/20   [provider]  Cholecalciferol (VITAMIN D) 125 MCG (5000 UT) CAPS Take 1 capsule by mouth daily.    [provider]  losartan (COZAAR) 100 MG tablet Take 1 tablet by mouth at bedtime. 11/07/20   [provider]  phenazopyridine (PYRIDIUM) 95 MG tablet Take 1 tablet (95 mg total) by mouth 3 (three) times daily as needed for pain. 11/22/20   Merwyn Katos, MD  prednisoLONE acetate (PRED FORTE) 1 % ophthalmic suspension Place 1 drop into the left eye 4 (four) times daily. 11/06/20   [provider]  rosuvastatin (CRESTOR) 40 MG tablet Take 1 tablet (40 mg total) by mouth daily. Please call to schedule appointment for further refills. Thank you! Patient taking differently: Take 40 mg by mouth at bedtime. Please call to schedule appointment for further refills. Thank you! 12/21/19   Iran Ouch, MD  tamsulosin (FLOMAX) 0.4 MG CAPS capsule Take 0.4 mg by mouth at bedtime.    [provider]    Physical Exam: Vitals:   12/03/20 2100 12/03/20 2111 12/03/20 2115 12/03/20 2130  BP: 134/90  (!) 133/98 (!) 139/93  Pulse: 84  84 85  Resp: Temp:  (!) 100.7 F (38.2 C)    TempSrc:  Oral    SpO2: 93%  93% 94%      Constitutional:  Acutely ill looking, no distress Vitals:   12/03/20 2100 12/03/20 2111 12/03/20 2115 12/03/20 2130  BP: 134/90  (!) 133/98 (!) 139/93  Pulse: 84  84 85  Resp: Temp:  (!) 100.7 F (38.2 C)    TempSrc:  Oral    SpO2: 93%  93% 94%   Eyes: PERRL, lids and conjunctivae normal ENMT: Mucous membranes are dry. Posterior pharynx clear of any exudate or lesions.Normal dentition.  Neck: normal, supple, no masses, no thyromegaly Respiratory: clear to auscultation bilaterally, no wheezing, no crackles. Normal respiratory effort. No accessory muscle use.  Cardiovascular: Sinus tachycardia, no murmurs / rubs / gallops. No extremity edema. 2+ pedal pulses. No carotid bruits.  Abdomen: no tenderness, no masses palpated. No hepatosplenomegaly. Bowel sounds positive.  Musculoskeletal: no  clubbing / cyanosis. No joint deformity upper and lower extremities. Good ROM, no contractures. Normal muscle tone.  Skin: no rashes, lesions, ulcers. No induration Neurologic: CN 2-12 grossly intact. Sensation intact, DTR normal. Strength 5/5 in all 4.  Psychiatric: Awake and alert and oriented.  Some mild confusion    Labs on Admission: I have personally reviewed following labs and imaging studies  CBC: Recent Labs  Lab 12/03/20 1805  WBC 22.4*  NEUTROABS 19.6*  HGB 17.1*  HCT 48.4  MCV 91.5  PLT 159   Basic Metabolic Panel: Recent Labs  Lab 12/03/20 1805  NA 131*  K 3.9  CL 99  CO2 24  GLUCOSE 121*  BUN 15  CREATININE 1.41*  CALCIUM 9.1   GFR: Estimated Creatinine Clearance: 69.7 mL/min (A) (by C-G formula based on SCr of 1.41 mg/dL (H)). Liver Function Tests: Recent Labs  Lab 12/03/20 1805  AST 25  ALT 26  ALKPHOS 77  BILITOT 2.0*  PROT 7.2  ALBUMIN 3.7   No results for input(s): LIPASE, AMYLASE in the last 168 hours. No results for input(s): AMMONIA in the last 168 hours. Coagulation Profile: Recent Labs  Lab 12/03/20 1805  INR 1.2   Cardiac Enzymes: No results for  input(s): CKTOTAL, CKMB, CKMBINDEX, TROPONINI in the last 168 hours. BNP (last 3 results) No results for input(s): PROBNP in the last 8760 hours. HbA1C: No results for input(s): HGBA1C in the last 72 hours. CBG: No results for input(s): GLUCAP in the last 168 hours. Lipid Profile: No results for input(s): CHOL, HDL, LDLCALC, TRIG, CHOLHDL, LDLDIRECT in the last 72 hours. Thyroid Function Tests: No results for input(s): TSH, T4TOTAL, FREET4, T3FREE, THYROIDAB in the last 72 hours. Anemia Panel: No results for input(s): VITAMINB12, FOLATE, FERRITIN, TIBC, IRON, RETICCTPCT in the last 72 hours. Urine analysis:    Component Value Date/Time   COLORURINE AMBER (A) 12/03/2020 1722   APPEARANCEUR HAZY (A) 12/03/2020 1722   LABSPEC 1.011 12/03/2020 1722   PHURINE 6.0 12/03/2020 1722   GLUCOSEU NEGATIVE 12/03/2020 1722   HGBUR LARGE (A) 12/03/2020 1722   BILIRUBINUR NEGATIVE 12/03/2020 1722   KETONESUR NEGATIVE 12/03/2020 1722   PROTEINUR 100 (A) 12/03/2020 1722   NITRITE POSITIVE (A) 12/03/2020 1722   LEUKOCYTESUR LARGE (A) 12/03/2020 1722   Sepsis Labs: @LABRCNTIP (procalcitonin:4,lacticidven:4) ) Recent Results (from the past 240 hour(s))  Resp Panel by RT-PCR (Flu A&B, Covid) Nasopharyngeal Swab     Status: None   Collection Time: 12/03/20  6:50 PM   Specimen: Nasopharyngeal Swab; Nasopharyngeal(NP) swabs in vial transport medium  Result Value Ref Range Status   SARS Coronavirus 2 by RT PCR NEGATIVE NEGATIVE Final    Comment: (NOTE) SARS-CoV-2 target nucleic acids are NOT DETECTED.  The SARS-CoV-2 RNA is generally detectable in upper respiratory specimens during the acute phase of infection. The lowest concentration of SARS-CoV-2 viral copies this assay can detect is 138 copies/mL. A negative result does not preclude SARS-Cov-2 infection and should not be used as the sole basis for treatment or other patient management decisions. A negative result may occur with  improper  specimen collection/handling, submission of specimen other than nasopharyngeal swab, presence of viral mutation(s) within the areas targeted by this assay, and inadequate number of viral copies(<138 copies/mL). A negative result must be combined with clinical observations, patient history, and epidemiological information. The expected result is Negative.  Fact Sheet for Patients:  12/05/20  Fact Sheet for Healthcare Providers:  BloggerCourse.com  This test is no t yet  approved or cleared by the [place] and  has been authorized for detection and/or diagnosis of SARS-CoV-2 by FDA under an Emergency Use Authorization (EUA). This EUA will remain  in effect (meaning this test can be used) for the duration of the COVID-19 declaration under Section 564(b)(1) of the Act, 21 U.S.C.section 360bbb-3(b)(1), unless the authorization is terminated  or revoked sooner.       Influenza A by PCR NEGATIVE NEGATIVE Final   Influenza B by PCR NEGATIVE NEGATIVE Final    Comment: (NOTE) The Xpert Xpress SARS-CoV-2/FLU/RSV plus assay is intended as an aid in the diagnosis of influenza from Nasopharyngeal swab specimens and should not be used as a sole basis for treatment. Nasal washings and aspirates are unacceptable for Xpert Xpress SARS-CoV-2/FLU/RSV testing.  Fact Sheet for Patients: BloggerCourse.com  Fact Sheet for Healthcare Providers: SeriousBroker.it  This test is not yet approved or cleared by the [place] FDA and has been authorized for detection and/or diagnosis of SARS-CoV-2 by FDA under an Emergency Use Authorization (EUA). This EUA will remain in effect (meaning this test can be used) for the duration of the COVID-19 declaration under Section 564(b)(1) of the Act, 21 U.S.C. section 360bbb-3(b)(1), unless the authorization is terminated or revoked.  Performed at  [facility], [address], [place], [place] [address]      Radiological Exams on Admission: DG Chest Port 1 View  Result Date: 12/03/2020 CLINICAL DATA:  Sepsis EXAM: PORTABLE CHEST 1 VIEW COMPARISON:  12/05/2016 FINDINGS: The heart size and mediastinal contours are within normal limits. Both lungs are clear. The visualized skeletal structures are unremarkable. IMPRESSION: No active disease. Electronically Signed   By: Jasmine Pang M.D.   On: 12/03/2020 18:57    EKG: Independently reviewed.   Assessment/Plan Principal Problem:   Severe sepsis (HCC) Active Problems:   CAD in native artery   Systolic dysfunction   Essential hypertension   Mixed hyperlipidemia   UTI (urinary tract infection)   Acute metabolic encephalopathy   Hyponatremia     #1 severe sepsis: Patient meets sepsis criteria with temperature, heart rate and leukocytosis.  He meets severe sepsis with altered mental status.  Currently improved mentally.  The source is due to UTI.  Admit the patient.  Initiate IV Rocephin.  IV fluids.  Blood and urine cultures obtained.  Will switch patient to inpatient due to the sepsis.  #2 complex UTI: Patient has significant history of BPH with a Foley in place.  This makes his UTI more complex.  Treat as above.  Follow culture results.  #3 BPH: Has obstructive uropathy with Foley catheter in place.  Will continue Flomax.  Also on phenothopyridine.  #4 acute metabolic encephalopathy: Improving.  Continue to monitor  #5 essential hypertension: Currently on carvedilol and losartan.  We will resume  #6 GERD: Continue PPIs  #7 hyperlipidemia: Patient on Crestor 40 mg.  We will continue  #8 coronary artery disease: Stable at baseline.   DVT prophylaxis: Lovenox Code Status: Full code Family Communication: Male partner at bedside Disposition Plan: Home Consults called: None Admission status: Inpatient  Severity of Illness: The appropriate patient status for this  patient is INPATIENT. Inpatient status is judged to be reasonable and necessary in order to provide the required intensity of service to ensure the patient's safety. The patient's presenting symptoms, physical exam findings, and initial radiographic and laboratory data in the context of their chronic comorbidities is felt to place them at high risk for  further clinical deterioration. Furthermore, it is not anticipated that the patient will be medically stable for discharge from the hospital within 2 midnights of admission. The following factors support the patient status of inpatient.   " The patient's presenting symptoms include altered mental status. " The worrisome physical exam findings include confusion. " The initial radiographic and laboratory data are worrisome because of evidence of sepsis and UTI. " The chronic co-morbidities include BPH.   * I certify that at the point of admission it is my clinical judgment that the patient will require inpatient hospital care spanning beyond 2 midnights from the point of admission due to high intensity of service, high risk for further deterioration and high frequency of surveillance required.Lonia Blood*   Aalia Greulich,LAWAL MD [facility] Pager [phone]  If 7PM-7AM, please contact night-coverage [email or web address] Password [email or web address]  12/03/2020, 11:05 PM

## 2020-12-03 NOTE — ED Notes (Signed)
Patient reporting some difficulty with expressing thoughts. NIH performed. Expressive aphasia noted. Pt reports this issue started yesterday.  Dr. Madilyn Hook at bedside evaluating. No other deficits noted.

## 2020-12-03 NOTE — ED Notes (Signed)
Patient alert and oriented x4. Patient has foley catheter. PIV infiltrated and was pulled.

## 2020-12-03 NOTE — ED Provider Notes (Signed)
Emergency Medicine Provider Triage Evaluation Note  Jeff Warren , a 65 y.o. male  was evaluated in triage.  Pt complains of confusion, shakiness, fever. Has some suprapubic discomfort with recent foley placement. No cough, sob.  Review of Systems  Positive: Confusion, shakiness, fever Negative: Cough, sob  Physical Exam  BP (!) 143/67   Pulse 90   Temp (!) 102.9 F (39.4 C) Comment: Triage nurse aware  Resp 20   SpO2 100%  Gen:   Awake, no distress   Resp:  Normal effort  MSK:   Moves extremities without difficulty   Medical Decision Making  Medically screening exam initiated at 6:04 PM.  Appropriate orders placed.  Jeff Warren was informed that the remainder of the evaluation will be completed by another provider, this initial triage assessment does not replace that evaluation, and the importance of remaining in the ED until their evaluation is complete.     Jeff Warren 12/03/20 1807    Jeff Munch, MD 12/03/20 2311

## 2020-12-03 NOTE — ED Provider Notes (Signed)
[facility] EMERGENCY DEPARTMENT Provider Note   CSN: [id number] Arrival date & time: 12/03/20  1722     History Chief Complaint  Patient presents with   Altered Mental Status    Jeff Warren is a 65 y.o. male.  The history is provided by the patient, the spouse and medical records.  Altered Mental Status Jeff Warren is a 65 y.o. male who presents to the Emergency Department complaining of altered mental status. He presents the emergency department accompanied by his husband for evaluation of altered mental status. He had cataract surgery on Thursday of last week. About four hours of post surgery he had urinary retention and a catheter was placed. He continues to have the catheter in place. He was doing well until yesterday he developed chills. Today he felt confused, dehydrated and had a fever. He was laying on the floor because he was shaking so hard. He has mild cough. No pain, vomiting, diarrhea. He has a history of coronary artery disease, hypertension. No additional medical problems.    Past Medical History:  Diagnosis Date   BPH (benign prostatic hyperplasia)    CAD (coronary artery disease)    a. inferior ST elevation MI 12/05/16: cardiac cath LM nl, p-mLAD 80%, D1 CTO with right-to-left collats, LCx minor irregs,  m-dRCA 30%, dRCA 100% s/p PCI/DES, EF 45-50%, severely elevated LVEDP; b. staged PCI 12/23/16 - successful PCI/DES to p-mLAD with small diag being jailed by stent (vessel known to be subtotally occluded with R-L collats)   Dyspnea    Family history of premature CAD    a. sister passed from MI at age 39   Gout    HLD (hyperlipidemia)    Hypertension    Myocardial infarction (HCC) 2018   Systolic dysfunction    a. TTE 12/05/16: EF 50-55%, inf HK, GR1DD, mildly dilated LA    Patient Active Problem List   Diagnosis Date Noted   Coronary artery disease    CAD in native artery 12/14/2016   Systolic dysfunction 12/14/2016   Essential hypertension  12/14/2016   Mixed hyperlipidemia 12/14/2016   Family history of premature CAD 12/14/2016   Acute ST elevation myocardial infarction (STEMI) involving right coronary artery [facility])     Past Surgical History:  Procedure Laterality Date   ABDOMINOPLASTY     COLONOSCOPY WITH PROPOFOL N/A 06/29/2016   Procedure: COLONOSCOPY WITH PROPOFOL;  Surgeon: Charolett Bumpers, MD;  Location: [facility] ENDOSCOPY;  Service: Endoscopy;  Laterality: N/A;   CORONARY STENT INTERVENTION N/A 12/23/2016   Procedure: Coronary Stent Intervention;  Surgeon: Iran Ouch, MD;  Location: [facility] INVASIVE CV LAB;  Service: Cardiovascular;  Laterality: N/A;   CORONARY/GRAFT ACUTE MI REVASCULARIZATION N/A 12/05/2016   Procedure: Coronary/Graft Acute MI Revascularization;  Surgeon: Iran Ouch, MD;  Location: [facility] INVASIVE CV LAB;  Service: Cardiovascular;  Laterality: N/A;   ganglion cyst removal Right    GAS INSERTION Left 11/21/2020   Procedure: INSERTION OF GAS LEFT EYE (C3F8);  Surgeon: Stephannie Li, MD;  Location: [facility] OR;  Service: Ophthalmology;  Laterality: Left;   GAS/FLUID EXCHANGE Left 11/21/2020   Procedure: GAS/FLUID EXCHANGE;  Surgeon: Stephannie Li, MD;  Location: [facility] OR;  Service: Ophthalmology;  Laterality: Left;   LEFT HEART CATH AND CORONARY ANGIOGRAPHY N/A 12/05/2016   Procedure: Left Heart Cath and Coronary Angiography;  Surgeon: Iran Ouch, MD;  Location: [facility] INVASIVE CV LAB;  Service: Cardiovascular;  Laterality: N/A;   NO PAST SURGERIES  Has has some cosmetic surgeries   VITRECTOMY 25 GAUGE WITH SCLERAL BUCKLE Left 11/21/2020   Procedure: VITRECTOMY 25 GAUGE WITH SCLERAL BUCKLE;  Surgeon: Stephannie Li, MD;  Location: [facility] OR;  Service: Ophthalmology;  Laterality: Left;       Family History  Problem Relation Age of Onset   Diabetes Mellitus II Mother    Cancer Father    Diabetes Mellitus II Sister    Diabetes Mellitus II Brother    Kidney failure Brother     Social History   Tobacco  Use   Smoking status: Never   Smokeless tobacco: Never  Vaping Use   Vaping Use: Never used  Substance Use Topics   Alcohol use: No   Drug use: No    Home Medications Prior to Admission medications   Medication Sig Start Date End Date Taking? Authorizing Provider  Besifloxacin HCl (BESIVANCE) 0.6 % SUSP Place 1 drop into the left eye 4 (four) times daily. Sample    [provider]  carvedilol (COREG) 6.25 MG tablet Take 6.25 mg by mouth 2 (two) times daily. 09/04/20   [provider]  Cholecalciferol (VITAMIN D) 125 MCG (5000 UT) CAPS Take 1 capsule by mouth daily.    [provider]  losartan (COZAAR) 100 MG tablet Take 1 tablet by mouth at bedtime. 11/07/20   [provider]  phenazopyridine (PYRIDIUM) 95 MG tablet Take 1 tablet (95 mg total) by mouth 3 (three) times daily as needed for pain. 11/22/20   Merwyn Katos, MD  prednisoLONE acetate (PRED FORTE) 1 % ophthalmic suspension Place 1 drop into the left eye 4 (four) times daily. 11/06/20   [provider]  rosuvastatin (CRESTOR) 40 MG tablet Take 1 tablet (40 mg total) by mouth daily. Please call to schedule appointment for further refills. Thank you! Patient taking differently: Take 40 mg by mouth at bedtime. Please call to schedule appointment for further refills. Thank you! 12/21/19   Iran Ouch, MD  tamsulosin (FLOMAX) 0.4 MG CAPS capsule Take 0.4 mg by mouth at bedtime.    [provider]    Allergies    Bextra [valdecoxib]  Review of Systems   Review of Systems  All other systems reviewed and are negative.  Physical Exam Updated Vital Signs BP (!) 143/67   Pulse 90   Temp (!) 102.9 F (39.4 C) Comment: Triage nurse aware  Resp 20   SpO2 100%   Physical Exam Vitals and nursing note reviewed.  Constitutional:      Appearance: He is well-developed.  HENT:     Head: Normocephalic and atraumatic.  Cardiovascular:     Rate and Rhythm: Normal rate and regular  rhythm.     Heart sounds: No murmur heard. Pulmonary:     Effort: Pulmonary effort is normal. No respiratory distress.     Breath sounds: Normal breath sounds.  Abdominal:     Palpations: Abdomen is soft.     Tenderness: There is no abdominal tenderness. There is no guarding or rebound.  Musculoskeletal:        General: No swelling or tenderness.  Skin:    General: Skin is warm and dry.  Neurological:     Mental Status: He is alert and oriented to person, place, and time.  Psychiatric:        Behavior: Behavior normal.    ED Results / Procedures / Treatments   Labs (all labs ordered are listed, but only abnormal results are displayed) Labs  Reviewed  CULTURE, BLOOD (SINGLE)  URINE CULTURE  RESP PANEL BY RT-PCR (FLU A&B, COVID) ARPGX2  LACTIC ACID, PLASMA  LACTIC ACID, PLASMA  COMPREHENSIVE METABOLIC PANEL  CBC WITH DIFFERENTIAL/PLATELET  PROTIME-INR  APTT  URINALYSIS, ROUTINE W REFLEX MICROSCOPIC    EKG None  Radiology No results found.  Procedures Procedures   Medications Ordered in ED Medications  acetaminophen (TYLENOL) tablet 650 mg (has no administration in time range)    ED Course  I have reviewed the triage vital signs and the nursing notes.  Pertinent labs & imaging results that were available during my care of the patient were reviewed by me and considered in my medical decision making (see chart for details).    MDM Rules/Calculators/A&P                         patient with history of coronary artery disease here for evaluation of chills, altered mental status. He did recently have a Foley catheter placed for urinary retention several days ago. He was treated with antibiotics for presumed urinary tract infection. CBC was leukocytosis, lactic acid is within normal limits. Given how rapidly his symptoms developed with associated altered mental status plan to admit for observation. Presentation is not consistent with meningitis. Medicine consulted for  admission.  Final Clinical Impression(s) / ED Diagnoses Final diagnoses:  Sepsis [facility])    Rx / DC Orders ED Discharge Orders     None        Tilden Fossa, MD 12/03/20 2359

## 2020-12-03 NOTE — ED Triage Notes (Signed)
Pt had eye surgery 6/17.  Visitor reports pt cold and clammy yesterday.  Reports confusion today and difficulty speaking.  Generalized weakness.  No arm drift.  Pt has a foley and reports urine has been dark.

## 2020-12-03 NOTE — ED Notes (Signed)
Patient on call light, "feels like temperature is going up." Temp 102.1. Tylenol given

## 2020-12-04 DIAGNOSIS — A419 Sepsis, unspecified organism: Secondary | ICD-10-CM | POA: Diagnosis present

## 2020-12-04 DIAGNOSIS — R17 Unspecified jaundice: Secondary | ICD-10-CM | POA: Diagnosis present

## 2020-12-04 DIAGNOSIS — K219 Gastro-esophageal reflux disease without esophagitis: Secondary | ICD-10-CM | POA: Diagnosis present

## 2020-12-04 DIAGNOSIS — Z888 Allergy status to other drugs, medicaments and biological substances status: Secondary | ICD-10-CM | POA: Diagnosis not present

## 2020-12-04 DIAGNOSIS — E782 Mixed hyperlipidemia: Secondary | ICD-10-CM | POA: Diagnosis present

## 2020-12-04 DIAGNOSIS — E860 Dehydration: Secondary | ICD-10-CM | POA: Diagnosis present

## 2020-12-04 DIAGNOSIS — Z79899 Other long term (current) drug therapy: Secondary | ICD-10-CM | POA: Diagnosis not present

## 2020-12-04 DIAGNOSIS — Z6832 Body mass index (BMI) 32.0-32.9, adult: Secondary | ICD-10-CM | POA: Diagnosis not present

## 2020-12-04 DIAGNOSIS — T83518A Infection and inflammatory reaction due to other urinary catheter, initial encounter: Secondary | ICD-10-CM | POA: Diagnosis present

## 2020-12-04 DIAGNOSIS — N401 Enlarged prostate with lower urinary tract symptoms: Secondary | ICD-10-CM | POA: Diagnosis present

## 2020-12-04 DIAGNOSIS — N138 Other obstructive and reflux uropathy: Secondary | ICD-10-CM | POA: Diagnosis present

## 2020-12-04 DIAGNOSIS — R6520 Severe sepsis without septic shock: Secondary | ICD-10-CM | POA: Diagnosis present

## 2020-12-04 DIAGNOSIS — N390 Urinary tract infection, site not specified: Secondary | ICD-10-CM | POA: Diagnosis present

## 2020-12-04 DIAGNOSIS — I252 Old myocardial infarction: Secondary | ICD-10-CM | POA: Diagnosis not present

## 2020-12-04 DIAGNOSIS — N179 Acute kidney failure, unspecified: Secondary | ICD-10-CM | POA: Diagnosis present

## 2020-12-04 DIAGNOSIS — E669 Obesity, unspecified: Secondary | ICD-10-CM | POA: Diagnosis present

## 2020-12-04 DIAGNOSIS — I10 Essential (primary) hypertension: Secondary | ICD-10-CM | POA: Diagnosis present

## 2020-12-04 DIAGNOSIS — M109 Gout, unspecified: Secondary | ICD-10-CM | POA: Diagnosis present

## 2020-12-04 DIAGNOSIS — I2510 Atherosclerotic heart disease of native coronary artery without angina pectoris: Secondary | ICD-10-CM | POA: Diagnosis present

## 2020-12-04 DIAGNOSIS — Y846 Urinary catheterization as the cause of abnormal reaction of the patient, or of later complication, without mention of misadventure at the time of the procedure: Secondary | ICD-10-CM | POA: Diagnosis present

## 2020-12-04 DIAGNOSIS — Z8249 Family history of ischemic heart disease and other diseases of the circulatory system: Secondary | ICD-10-CM | POA: Diagnosis not present

## 2020-12-04 DIAGNOSIS — E871 Hypo-osmolality and hyponatremia: Secondary | ICD-10-CM | POA: Diagnosis present

## 2020-12-04 DIAGNOSIS — Z20822 Contact with and (suspected) exposure to covid-19: Secondary | ICD-10-CM | POA: Diagnosis present

## 2020-12-04 DIAGNOSIS — Z955 Presence of coronary angioplasty implant and graft: Secondary | ICD-10-CM | POA: Diagnosis not present

## 2020-12-04 DIAGNOSIS — G9341 Metabolic encephalopathy: Secondary | ICD-10-CM | POA: Diagnosis present

## 2020-12-04 LAB — COMPREHENSIVE METABOLIC PANEL
ALT: 24 U/L (ref 0–44)
AST: 26 U/L (ref 15–41)
Albumin: 3.3 g/dL — ABNORMAL LOW (ref 3.5–5.0)
Alkaline Phosphatase: 78 U/L (ref 38–126)
Anion gap: 8 (ref 5–15)
BUN: 17 mg/dL (ref 8–23)
CO2: 22 mmol/L (ref 22–32)
Calcium: 8.9 mg/dL (ref 8.9–10.3)
Chloride: 104 mmol/L (ref 98–111)
Creatinine, Ser: 1.33 mg/dL — ABNORMAL HIGH (ref 0.61–1.24)
GFR, Estimated: 59 mL/min — ABNORMAL LOW (ref >60)
Glucose, Bld: 88 mg/dL (ref 70–99)
Potassium: 3.5 mmol/L (ref 3.5–5.1)
Sodium: 134 mmol/L — ABNORMAL LOW (ref 135–145)
Total Bilirubin: 1.7 mg/dL — ABNORMAL HIGH (ref 0.3–1.2)
Total Protein: 6.5 g/dL (ref 6.5–8.1)

## 2020-12-04 LAB — CBC
HCT: 47.5 % (ref 39.0–52.0)
HCT: 48 % (ref 39.0–52.0)
Hemoglobin: 16.5 g/dL (ref 13.0–17.0)
Hemoglobin: 16.6 g/dL (ref 13.0–17.0)
MCH: 32.4 pg (ref 26.0–34.0)
MCH: 32.4 pg (ref 26.0–34.0)
MCHC: 34.4 g/dL (ref 30.0–36.0)
MCHC: 34.9 g/dL (ref 30.0–36.0)
MCV: 92.6 fL (ref 80.0–100.0)
MCV: 94.1 fL (ref 80.0–100.0)
Platelets: 107 10*3/uL — ABNORMAL LOW (ref 150–400)
Platelets: 118 10*3/uL — ABNORMAL LOW (ref 150–400)
RBC: 5.1 MIL/uL (ref 4.22–5.81)
RBC: 5.13 MIL/uL (ref 4.22–5.81)
RDW: 12.6 % (ref 11.5–15.5)
RDW: 12.6 % (ref 11.5–15.5)
WBC: 15.7 10*3/uL — ABNORMAL HIGH (ref 4.0–10.5)
WBC: 16.2 10*3/uL — ABNORMAL HIGH (ref 4.0–10.5)
nRBC: 0 % (ref 0.0–0.2)
nRBC: 0 % (ref 0.0–0.2)

## 2020-12-04 LAB — HIV ANTIBODY (ROUTINE TESTING W REFLEX): HIV Screen 4th Generation wRfx: NONREACTIVE

## 2020-12-04 LAB — LACTIC ACID, PLASMA: Lactic Acid, Venous: 1.9 mmol/L (ref 0.5–1.9)

## 2020-12-04 MED ORDER — ACETAMINOPHEN 650 MG RE SUPP
650.0000 mg | Freq: Four times a day (QID) | RECTAL | Status: DC | PRN
Start: 2020-12-04 — End: 2020-12-05

## 2020-12-04 MED ORDER — CARVEDILOL 3.125 MG PO TABS
3.1250 mg | ORAL_TABLET | Freq: Two times a day (BID) | ORAL | Status: DC
  Administered 2020-12-04 – 2020-12-05 (×3): 3.125 mg via ORAL
  Filled 2020-12-04 (×3): qty 1

## 2020-12-04 MED ORDER — SODIUM CHLORIDE 0.9 % IV SOLN
1.0000 g | INTRAVENOUS | Status: DC
  Administered 2020-12-04: 1 g via INTRAVENOUS
  Filled 2020-12-04: qty 10
  Filled 2020-12-04: qty 1

## 2020-12-04 MED ORDER — ONDANSETRON HCL 4 MG PO TABS: 4.0000 mg | ORAL_TABLET | Freq: Four times a day (QID) | ORAL | Status: DC | PRN

## 2020-12-04 MED ORDER — ONDANSETRON HCL 4 MG/2ML IJ SOLN: 4.0000 mg | Freq: Four times a day (QID) | INTRAMUSCULAR | Status: DC | PRN

## 2020-12-04 MED ORDER — SODIUM CHLORIDE 0.9 % IV SOLN: INTRAVENOUS | Status: DC

## 2020-12-04 MED ORDER — ACETAMINOPHEN 325 MG PO TABS
650.0000 mg | ORAL_TABLET | Freq: Four times a day (QID) | ORAL | Status: DC | PRN
Start: 2020-12-04 — End: 2020-12-05
  Administered 2020-12-04: 650 mg via ORAL
  Filled 2020-12-04: qty 2

## 2020-12-04 MED ORDER — ENOXAPARIN SODIUM 40 MG/0.4ML IJ SOSY
40.0000 mg | PREFILLED_SYRINGE | Freq: Every day | INTRAMUSCULAR | Status: DC
  Administered 2020-12-04 – 2020-12-05 (×2): 40 mg via SUBCUTANEOUS
  Filled 2020-12-04 (×3): qty 0.4

## 2020-12-04 MED ORDER — CHLORHEXIDINE GLUCONATE CLOTH 2 % EX PADS
6.0000 | MEDICATED_PAD | Freq: Every day | CUTANEOUS | Status: DC
  Administered 2020-12-04 – 2020-12-05 (×2): 6 via TOPICAL

## 2020-12-04 NOTE — Progress Notes (Signed)
[facility] Progress Note  Patient: Jeff Warren    MHD:[id number]  DOA: 12/03/2020     Date of Service: the patient was seen and examined on 12/04/2020  Brief hospital course: Past medical history of BPH with recent insertion for Foley catheter, HTN, CAD, gout. Recently underwent eye surgery after patient developed retention of the urine.  Went to [facility] urology and had a Foley catheter placed in the office.  Presents with complaints of confusion fatigue and dizziness for last 2 days with fever and chills. Found to have UTI. Currently plan is follow-up on cultures..  Subjective: Still fatigue and tiredness present.  No nausea no vomiting.  No fever or chills.  No chest pain.  Abdominal pain.  No flank pain.  No blood in the urine.  No diarrhea no constipation.  Assessment and Plan: 1.  Severe sepsis, present on admission.  Due to UTI. BPH. Catheter associated UTI, catheter present on admission. Meeting sepsis criteria on admission with fever, tachycardia and leukocytosis. With confusion meds severe sepsis criteria. Likely secondary to UTI. Continuing with IV Rocephin Foley catheter not changed. We discussed with urology regarding this. Follow-up on cultures.  2.  Hyponatremia Improving.  Monitor.  3.  Acute kidney injury Improving.  Continue with IV fluids.  4.  Hyperbilirubinemia In the setting of sepsis.  Improving.  5.  Acute metabolic encephalopathy Improving. Back to baseline per family.  Continue with IV fluids.  6.  HTN Blood pressure stable. Holding losartan.  Reducing dose of the carvedilol.  Monitor.  7.  History of CAD. Continue aspirin. No chest pain.  8.  HLD. Continue Crestor.  9.  Recent eye surgery. Continuing eyedrops.  Scheduled Meds:  aspirin  81 mg Oral Daily   carvedilol  3.125 mg Oral BID WC   Chlorhexidine Gluconate Cloth  6 each Topical Q0600   cholecalciferol  5,000 Units Oral Daily   enoxaparin (LOVENOX) injection  40 mg  Subcutaneous Daily   fluticasone  1 spray Each Nare QHS   pantoprazole  40 mg Oral Daily   prednisoLONE acetate  1 drop Left Eye TID   rosuvastatin  40 mg Oral QHS   tamsulosin  0.4 mg Oral QHS   Continuous Infusions:  sodium chloride 100 mL/hr at 12/04/20 1827   cefTRIAXone (ROCEPHIN)  IV     PRN Meds: acetaminophen **OR** acetaminophen, ondansetron **OR** ondansetron (ZOFRAN) IV, phenazopyridine  Body mass index is 32.29 kg/m.        DVT Prophylaxis:   enoxaparin (LOVENOX) injection 40 mg Start: 12/04/20 1000    Advance goals of care discussion: Pt is Full code.  Family Communication: family was present at bedside, at the time of interview.  The pt provided permission to discuss medical plan with the family. Opportunity was given to ask question and all questions were answered satisfactorily.   Data Reviewed: I have personally reviewed and interpreted daily labs, tele strips, imaging.   Physical Exam:  General: Appear in mild distress, no Rash; Oral Mucosa Clear, moist. no Abnormal Neck Mass Or lumps, Conjunctiva normal  Cardiovascular: S1 and S2 Present, no Murmur, Respiratory: good respiratory effort, Bilateral Air entry present and CTA, no Crackles, no wheezes Abdomen: Bowel Sound present, Soft and no tenderness Extremities: no Pedal edema Neurology: alert and oriented to time, place, and person affect appropriate. no new focal deficit Gait not checked due to patient safety concerns  Vitals:   12/04/20 0100 12/04/20 0431 12/04/20 0955 12/04/20 1643  BP:  105/74  133/83 137/88  Pulse:  73 83 79  Resp:  18 18 18   Temp:  98.9 F (37.2 C) (!) 100.9 F (38.3 C) 100.2 F (37.9 C)  TempSrc:  Oral Oral Oral  SpO2:  96% 98% 99%  Weight: 111 kg       Disposition:  Status is: Inpatient  Remains inpatient appropriate because:Ongoing diagnostic testing needed not appropriate for outpatient work up  Dispo: The patient is from: Home              Anticipated d/c is  to: Home              Patient currently is not medically stable to d/c.   Difficult to place patient No        Time spent: 35 minutes. I reviewed all nursing notes, pharmacy notes, vitals, pertinent old records. I have discussed plan of care as described above with RN.  Author: , MD [facility] 12/04/2020 8:09 PM  To reach On-call, see care teams to locate the attending and reach out via [email or web address]/06/2020. Between 7PM-7AM, please contact night-coverage If you still have difficulty reaching the attending provider, please page the [facility] (Director on Call) for [facility] on amion for assistance.

## 2020-12-04 NOTE — Plan of Care (Signed)
°  Problem: Clinical Measurements: °Goal: Will remain free from infection °Outcome: Progressing °  °Problem: Activity: °Goal: Risk for activity intolerance will decrease °Outcome: Progressing °  °Problem: Elimination: °Goal: Will not experience complications related to bowel motility °Outcome: Progressing °  °

## 2020-12-04 NOTE — Care Management Obs Status (Signed)
MEDICARE OBSERVATION STATUS NOTIFICATION   Patient Details  Name: Jeff Warren MRN: [id number] Date of Birth: 04/08/1956   Medicare Observation Status Notification Given:  Yes    Lawerance Sabal, RN 12/04/2020, 2:54 PM

## 2020-12-04 NOTE — Progress Notes (Signed)
New Admission Note:   Arrival Method: via stretcher from ED Mental Orientation: alert and oriented x4 Telemetry: 42m03, CCMD notified Assessment: Completed Skin: Intact, warm and dry IV: RAC, nsl Pain: 3/10. Suprapubic pain, dull and bearable, no pain medication needed Tubes: None Safety Measures: Safety Fall Prevention Plan has been discussed  Admission: to be completed 5 [place] Orientation: Patient has been oriented to the room, unit and staff.   Family: none at bedside  Orders to be reviewed and implemented. Will continue to monitor the patient. Call light has been placed within reach and bed alarm has been activated.

## 2020-12-04 NOTE — TOC Initial Note (Signed)
Transition of Care [facility]) - Initial/Assessment Note    Patient Details  Name: Jeff Warren MRN: [id number] Date of Birth: 1956/02/02  Transition of Care [facility]) CM/SW Contact:    Lawerance Sabal, RN Phone Number: 12/04/2020, 2:55 PM  Clinical Narrative:          Sherron Monday w patient at bedside. Discussed Dc plan to home, no needs identified at this time.          Expected Discharge Plan: Home/Self Care Barriers to Discharge: Continued Medical Work up   Patient Goals and CMS Choice Patient states their goals for this hospitalization and ongoing recovery are:: return home      Expected Discharge Plan and Services Expected Discharge Plan: Home/Self Care                                              Prior Living Arrangements/Services                       Activities of Daily Living      Permission Sought/Granted                  Emotional Assessment              Admission diagnosis:  UTI (urinary tract infection) [N39.0] Acute UTI [N39.0] Sepsis (HCC) [A41.9] Patient Active Problem List   Diagnosis Date Noted   UTI (urinary tract infection) 12/03/2020   Benign prostatic hyperplasia with lower urinary tract symptoms 12/03/2020   Elevated blood-pressure reading without diagnosis of hypertension 12/03/2020   Hypothyroidism 12/03/2020   Acute metabolic encephalopathy 12/03/2020   Hyponatremia 12/03/2020   Severe sepsis (HCC) 12/03/2020   Coronary artery disease    CAD in native artery 12/14/2016   Systolic dysfunction 12/14/2016   Essential hypertension 12/14/2016   Mixed hyperlipidemia 12/14/2016   Family history of premature CAD 12/14/2016   Acute ST elevation myocardial infarction (STEMI) involving right coronary artery (HCC)    PCP:  [name], MD Pharmacy:   [facility]) - [place], [place] - [address] [place] [address] Phone: [phone] Fax:  [phone]     Social Determinants of Health (SDOH) Interventions    Readmission Risk Interventions No flowsheet data found.

## 2020-12-05 LAB — BASIC METABOLIC PANEL
Anion gap: 7 (ref 5–15)
BUN: 18 mg/dL (ref 8–23)
CO2: 21 mmol/L — ABNORMAL LOW (ref 22–32)
Calcium: 8.4 mg/dL — ABNORMAL LOW (ref 8.9–10.3)
Chloride: 107 mmol/L (ref 98–111)
Creatinine, Ser: 1.22 mg/dL (ref 0.61–1.24)
GFR, Estimated: >60 mL/min (ref >60)
Glucose, Bld: 126 mg/dL — ABNORMAL HIGH (ref 70–99)
Potassium: 3.6 mmol/L (ref 3.5–5.1)
Sodium: 135 mmol/L (ref 135–145)

## 2020-12-05 LAB — PROTIME-INR
INR: 1.1 (ref 0.8–1.2)
Prothrombin Time: 14.6 seconds (ref 11.4–15.2)

## 2020-12-05 LAB — CBC WITH DIFFERENTIAL/PLATELET
Abs Immature Granulocytes: 0.05 10*3/uL (ref 0.00–0.07)
Basophils Absolute: 0 10*3/uL (ref 0.0–0.1)
Basophils Relative: 1 %
Eosinophils Absolute: 0.2 10*3/uL (ref 0.0–0.5)
Eosinophils Relative: 2 %
HCT: 41.9 % (ref 39.0–52.0)
Hemoglobin: 14.7 g/dL (ref 13.0–17.0)
Immature Granulocytes: 1 %
Lymphocytes Relative: 12 %
Lymphs Abs: 1 10*3/uL (ref 0.7–4.0)
MCH: 32.3 pg (ref 26.0–34.0)
MCHC: 35.1 g/dL (ref 30.0–36.0)
MCV: 92.1 fL (ref 80.0–100.0)
Monocytes Absolute: 0.8 10*3/uL (ref 0.1–1.0)
Monocytes Relative: 9 %
Neutro Abs: 6.6 10*3/uL (ref 1.7–7.7)
Neutrophils Relative %: 75 %
Platelets: 120 10*3/uL — ABNORMAL LOW (ref 150–400)
RBC: 4.55 MIL/uL (ref 4.22–5.81)
RDW: 12.8 % (ref 11.5–15.5)
WBC: 8.6 10*3/uL (ref 4.0–10.5)
nRBC: 0 % (ref 0.0–0.2)

## 2020-12-05 LAB — FIBRINOGEN: Fibrinogen: >800 mg/dL — ABNORMAL HIGH (ref 210–475)

## 2020-12-05 MED ORDER — CEFDINIR 300 MG PO CAPS: 300.0000 mg | ORAL_CAPSULE | Freq: Two times a day (BID) | ORAL | 0 refills | Status: AC

## 2020-12-05 NOTE — Progress Notes (Addendum)
DISCHARGE NOTE HOME Jeff Warren to be discharged Home  per MD order. Discussed prescriptions and follow up appointments with the patient. Prescriptions given to patient; medication list explained in detail. Explained foley cath care for home care. Patient verbalized understanding.  Skin clean, dry and intact without evidence of skin break down, no evidence of skin tears noted. IV catheter discontinued intact. Site without signs and symptoms of complications. Dressing and pressure applied. Pt denies pain at the site currently. No complaints noted.  Patient foley cath removed and replaced with Fr # 14 foley with foley bag. Free of drains, and wounds.   An After Visit Summary (AVS) was printed and given to the patient.   Patient escorted by significant other  ambulating and discharged home via private auto.  Katrina Stack, RN

## 2020-12-05 NOTE — Plan of Care (Signed)
  Problem: Clinical Measurements: Goal: Will remain free from infection Outcome: Progressing   Problem: Elimination: Goal: Will not experience complications related to bowel motility Outcome: Progressing Goal: Will not experience complications related to urinary retention Outcome: Progressing   

## 2020-12-05 NOTE — Plan of Care (Signed)

## 2020-12-05 NOTE — Plan of Care (Signed)
  Problem: Education: Goal: Knowledge of General Education information will improve Description: Including pain rating scale, medication(s)/side effects and non-pharmacologic comfort measures Outcome: Adequate for Discharge   Problem: Health Behavior/Discharge Planning: Goal: Ability to manage health-related needs will improve Outcome: Adequate for Discharge   Problem: Clinical Measurements: Goal: Ability to maintain clinical measurements within normal limits will improve Outcome: Adequate for Discharge Goal: Will remain free from infection 12/05/2020 1752 by Katrina Stack, RN Outcome: Adequate for Discharge 12/05/2020 0804 by Katrina Stack, RN Outcome: Progressing Goal: Diagnostic test results will improve Outcome: Adequate for Discharge Goal: Respiratory complications will improve Outcome: Adequate for Discharge Goal: Cardiovascular complication will be avoided Outcome: Adequate for Discharge   Problem: Activity: Goal: Risk for activity intolerance will decrease Outcome: Adequate for Discharge   Problem: Nutrition: Goal: Adequate nutrition will be maintained Outcome: Adequate for Discharge   Problem: Coping: Goal: Level of anxiety will decrease Outcome: Adequate for Discharge   Problem: Elimination: Goal: Will not experience complications related to bowel motility 12/05/2020 1752 by Katrina Stack, RN Outcome: Adequate for Discharge 12/05/2020 0804 by Katrina Stack, RN Outcome: Progressing Goal: Will not experience complications related to urinary retention 12/05/2020 1752 by Katrina Stack, RN Outcome: Adequate for Discharge 12/05/2020 0804 by Katrina Stack, RN Outcome: Progressing   Problem: Pain Managment: Goal: General experience of comfort will improve Outcome: Adequate for Discharge   Problem: Safety: Goal: Ability to remain free from injury will improve Outcome: Adequate for Discharge   Problem: Skin Integrity: Goal: Risk for impaired  skin integrity will decrease Outcome: Adequate for Discharge

## 2020-12-06 LAB — URINE CULTURE: Culture: >=100000 — AB

## 2020-12-06 NOTE — Discharge Summary (Signed)
[facility] Discharge Summary   Patient: Jeff Warren:[id number]  PCP: Josetta Huddle, MD  Date of admission: 12/03/2020   Date of discharge: 12/05/2020     Discharge Diagnoses:  Principal Problem:   Severe sepsis [facility]) Active Problems:   CAD in native artery   Systolic dysfunction   Essential hypertension   Mixed hyperlipidemia   UTI (urinary tract infection)   Acute metabolic encephalopathy   Hyponatremia   Admitted From: Home Disposition:  Home   Recommendations for Outpatient Follow-up:  PCP: Follow-up with PCP follow-up with urology. Follow up LABS/TEST: None  Discharge Instructions     Diet - low sodium heart healthy   Complete by: As directed    Increase activity slowly   Complete by: As directed    No wound care   Complete by: As directed        Diet recommendation: Regular diet  Activity: The patient is advised to gradually reintroduce usual activities, as tolerated  Discharge Condition: stable  Code Status: Full code   History of present illness: As per the H and P dictated on admission, "Jeff Warren is a 65 y.o. male with medical history significant of BPH status post insertion of Foley catheter last week, essential hypertension, coronary artery disease, gout, who had Foley catheter inserted last week and is supposed to be returning this week for removal possibly.  Patient came to the ER due to some altered mental status.  He is here with his partner who reported patient confused.  Evaluation in the ER showed evidence of UTI.  He is improving since arrival in the ER and getting treated.  No fever or chills.  No nausea vomiting or diarrhea.  Due to his transient confusion and the fact that he is has a Foley in place patient is being admitted for observation and IV antibiotics therapy..   ED Course: Temperature 102.9, blood pressure 193/76, pulse 90 respirate of 25 oxygen sat 92% on room air.  White count 22.4 hemoglobin 17.1 and platelets 159.  Sodium 131  potassium 3.9 chloride 99 CO2 24 BUN 15 creatinine 1.4 and calcium 9.1.  Influenza and COVID-19 screen negative.  Chest x-ray showed no acute findings.  EKG shows sinus tachycardia.  Multiple interventricular conduction delays.  Urinalysis showed amber urine with positive nitrite, large leukocytes, many bacteria and WBC more than 50.  RBCs 21-50.  Patient being admitted with severe sepsis as a result of UTI"  Hospital Course:  Summary of his active problems in the hospital is as following. 1.  Severe sepsis, present on admission.  Due to UTI. BPH. Catheter associated UTI, catheter present on admission. Meeting SIRS criteria on admission with fever, tachycardia and leukocytosis. With confusion met severe sepsis criteria. Likely secondary to UTI. Treated with IV Rocephin Foley catheter not changed on admission.  Changed prior to discharge. Blood culture negative for 2 days. Urine culture is growing Citrobacter although patient is clinically responding to current IV antibiotics.  Therefore we will discharge on third-generation cephalosporin Omnicef.  Patient will follow-up with urology for BPH.   2.  Hyponatremia Improving.  Monitor.   3.  Acute kidney injury Resolved.  Treated with IV fluids.   4.  Hyperbilirubinemia In the setting of sepsis.  Improving.   5.  Acute metabolic encephalopathy Improving. Back to baseline per family.     6.  HTN Blood pressure stable. Resuming home regimen. Carvedilol dose was reduced and losartan was held in the hospital.   7.  History of CAD. Continue aspirin. No chest pain.   8.  HLD. Continue Crestor.   9.  Recent eye surgery. Continuing eyedrops.  10.  Obesity. Placing the patient at high risk for poor outcome.  Outpatient follow-up with PCP. Body mass index is 32.61 kg/m.   Patient was ambulatory without any assistance. On the day of the discharge the patient's vitals were stable, and no other new acute medical condition were  reported. The patient was felt safe to be discharge at Home with no therapy needed on discharge.  Consultants: none Procedures: Foley catheter exchanged.  DISCHARGE MEDICATION: Allergies as of 12/05/2020       Reactions   Bextra [valdecoxib] Swelling        Medication List     TAKE these medications    acetaminophen 500 MG tablet Commonly known as: TYLENOL Take 500 mg by mouth every 6 (six) hours as needed for moderate pain or headache.   aspirin 81 MG chewable tablet Chew 81 mg by mouth daily.   CALCIUM-MAGNESIUM-ZINC-D3 PO Take 1 tablet by mouth daily as needed (leg cramps).   carvedilol 6.25 MG tablet Commonly known as: COREG Take 6.25 mg by mouth 2 (two) times daily.   cefdinir 300 MG capsule Commonly known as: OMNICEF Take 1 capsule (300 mg total) by mouth 2 (two) times daily for 5 days.   fluticasone 50 MCG/ACT nasal spray Commonly known as: FLONASE Place 1 spray into both nostrils at bedtime.   indomethacin 50 MG capsule Commonly known as: INDOCIN Take 50 mg by mouth 2 (two) times daily as needed (gout flares).   losartan 100 MG tablet Commonly known as: COZAAR Take 1 tablet by mouth at bedtime.   phenazopyridine 95 MG tablet Commonly known as: PYRIDIUM Take 1 tablet (95 mg total) by mouth 3 (three) times daily as needed for pain.   Potassium Gluconate 595 MG Caps Take 1 capsule by mouth daily as needed (leg cramps).   prednisoLONE acetate 1 % ophthalmic suspension Commonly known as: PRED FORTE Place 1 drop into the left eye 3 (three) times daily.   PRILOSEC OTC PO Take 1 tablet by mouth every other day.   rosuvastatin 40 MG tablet Commonly known as: CRESTOR Take 1 tablet (40 mg total) by mouth daily. Please call to schedule appointment for further refills. Thank you! What changed: when to take this   tamsulosin 0.4 MG Caps capsule Commonly known as: FLOMAX Take 0.4 mg by mouth at bedtime.   Vitamin D 125 MCG (5000 UT) Caps Take 1 capsule  by mouth daily.       ASK your doctor about these medications    Besivance 0.6 % Susp Generic drug: Besifloxacin HCl Place 1 drop into the left eye 4 (four) times daily. Sample        Discharge Exam: Filed Weights   12/04/20 0100 12/04/20 2049 12/05/20 0520  Weight: 111 kg 111 kg 112.1 kg   Vitals:   12/05/20 0520 12/05/20 1003  BP: (!) 145/89 (!) 143/94  Pulse: 83 72  Resp: 17 19  Temp: 98.4 F (36.9 C) 98.5 F (36.9 C)  SpO2: 95% 95%   General: Appear in mild distress, no Rash; Oral Mucosa Clear, moist. no Abnormal Neck Mass Or lumps, Conjunctiva normal  Cardiovascular: S1 and S2 Present, no Murmur, Respiratory: good respiratory effort, Bilateral Air entry present and CTA, no Crackles, no wheezes Abdomen: Bowel Sound present, Soft and no tenderness Extremities: no Pedal edema Neurology: alert and oriented to  time, place, and person affect appropriate. no new focal deficit Gait not checked due to patient safety concerns  The results of significant diagnostics from this hospitalization (including imaging, microbiology, ancillary and laboratory) are listed below for reference.    Significant Diagnostic Studies: DG Chest Port 1 View  Result Date: 12/03/2020 CLINICAL DATA:  Sepsis EXAM: PORTABLE CHEST 1 VIEW COMPARISON:  12/05/2016 FINDINGS: The heart size and mediastinal contours are within normal limits. Both lungs are clear. The visualized skeletal structures are unremarkable. IMPRESSION: No active disease. Electronically Signed   By: Donavan Foil M.D.   On: 12/03/2020 18:57    Microbiology: Recent Results (from the past 240 hour(s))  Blood culture (routine single)     Status: None (Preliminary result)   Collection Time: 12/03/20  6:10 PM   Specimen: BLOOD  Result Value Ref Range Status   Specimen Description BLOOD RIGHT ANTECUBITAL  Final   Special Requests   Final    BOTTLES DRAWN AEROBIC AND ANAEROBIC Blood Culture results may not be optimal due to an  inadequate volume of blood received in culture bottles   Culture   Final    NO GROWTH 2 DAYS Performed at [facility], [place] [address] [place], [place] [address]    Report Status PENDING  Incomplete  Resp Panel by RT-PCR (Flu A&B, Covid) Nasopharyngeal Swab     Status: None   Collection Time: 12/03/20  6:50 PM   Specimen: Nasopharyngeal Swab; Nasopharyngeal(NP) swabs in vial transport medium  Result Value Ref Range Status   SARS Coronavirus 2 by RT PCR NEGATIVE NEGATIVE Final    Comment: (NOTE) SARS-CoV-2 target nucleic acids are NOT DETECTED.  The SARS-CoV-2 RNA is generally detectable in upper respiratory specimens during the acute phase of infection. The lowest concentration of SARS-CoV-2 viral copies this assay can detect is 138 copies/mL. A negative result does not preclude SARS-Cov-2 infection and should not be used as the sole basis for treatment or other patient management decisions. A negative result may occur with  improper specimen collection/handling, submission of specimen other than nasopharyngeal swab, presence of viral mutation(s) within the areas targeted by this assay, and inadequate number of viral copies(<138 copies/mL). A negative result must be combined with clinical observations, patient history, and epidemiological information. The expected result is Negative.  Fact Sheet for Patients:  EntrepreneurPulse.com.au  Fact Sheet for Healthcare Providers:  IncredibleEmployment.be  This test is no t yet approved or cleared by the [place] FDA and  has been authorized for detection and/or diagnosis of SARS-CoV-2 by FDA under an Emergency Use Authorization (EUA). This EUA will remain  in effect (meaning this test can be used) for the duration of the COVID-19 declaration under Section 564(b)(1) of the Act, 21 U.S.C.section 360bbb-3(b)(1), unless the authorization is terminated  or revoked sooner.       Influenza A  by PCR NEGATIVE NEGATIVE Final   Influenza B by PCR NEGATIVE NEGATIVE Final    Comment: (NOTE) The Xpert Xpress SARS-CoV-2/FLU/RSV plus assay is intended as an aid in the diagnosis of influenza from Nasopharyngeal swab specimens and should not be used as a sole basis for treatment. Nasal washings and aspirates are unacceptable for Xpert Xpress SARS-CoV-2/FLU/RSV testing.  Fact Sheet for Patients: EntrepreneurPulse.com.au  Fact Sheet for Healthcare Providers: IncredibleEmployment.be  This test is not yet approved or cleared by the [place] FDA and has been authorized for detection and/or diagnosis of SARS-CoV-2 by FDA under an Emergency Use Authorization (EUA). This EUA  will remain in effect (meaning this test can be used) for the duration of the COVID-19 declaration under Section 564(b)(1) of the Act, 21 U.S.C. section 360bbb-3(b)(1), unless the authorization is terminated or revoked.  Performed at [facility], [place] [address], [place], [place] [address]   Urine culture     Status: Abnormal (Preliminary result)   Collection Time: 12/03/20  7:15 PM   Specimen: In/Out Cath Urine  Result Value Ref Range Status   Specimen Description IN/OUT CATH URINE  Final   Special Requests SUPRAPUBIC  Final   Culture (A)  Final    >=100,000 COLONIES/mL CITROBACTER KOSERI SUSCEPTIBILITIES TO FOLLOW Performed at [facility], [place] [address] [place], [place] [address]    Report Status PENDING  Incomplete     Labs: CBC: Recent Labs  Lab 12/03/20 1805 12/04/20 0418 12/05/20 0807  WBC 22.4* 16.2*  15.7* 8.6  NEUTROABS 19.6*  --  6.6  HGB 17.1* 16.6  16.5 14.7  HCT 48.4 47.5  48.0 41.9  MCV 91.5 92.6  94.1 92.1  PLT 159 118*  107* 122*   Basic Metabolic Panel: Recent Labs  Lab 12/03/20 1805 12/04/20 0418 12/05/20 0807  NA 131* 134* 135  K 3.9 3.5 3.6  CL 99 104 107  CO2 24 22 21*  GLUCOSE 121* 88 126*  BUN _0 CREATININE 1.41* 1.33* 1.22  CALCIUM 9.1 8.9 8.4*   Liver Function Tests: Recent Labs  Lab 12/03/20 1805 12/04/20 0418  AST 25 26  ALT 26 24  ALKPHOS 77 78  BILITOT 2.0* 1.7*  PROT 7.2 6.5  ALBUMIN 3.7 3.3*   CBG: No results for input(s): GLUCAP in the last 168 hours.  Time spent: 35 minutes  Signed:  Berle Mull  [facility] 12/05/2020

## 2020-12-07 LAB — BLOOD CULTURE ID PANEL (REFLEXED) - BCID2

## 2020-12-08 ENCOUNTER — Other Ambulatory Visit: Payer: Self-pay | Admitting: Internal Medicine

## 2020-12-08 DIAGNOSIS — N3289 Other specified disorders of bladder: Secondary | ICD-10-CM

## 2020-12-08 MED ORDER — OXYBUTYNIN CHLORIDE 5 MG PO TABS: 5.0000 mg | ORAL_TABLET | Freq: Three times a day (TID) | ORAL | 0 refills | Status: DC | PRN

## 2020-12-08 NOTE — Progress Notes (Signed)
[facility] TELEPHONE ENCOUNTER NOTE  Patient: Jeff Warren SEG:[id number]   PCP: [name], MD DOB: 11/07/1955   DOS: 12/08/2020     The patient will notify the results of blood culture. Patient presented with a UTI after Foley catheter placement.  Found to have Citrobacter in the urine which is pansensitive.  Treated with IV antibiotics for 48 hours and discharged home on oral antibiotics for 5 more days for total of 7-day treatment regimen. Patient reports some bladder spasm but does not have any nausea vomiting fever chills or flank pain. Blood culture came back positive for Citrobacter sensitivities currently pending but suspect this will be the same as prior. Patient was already responding clinically well in the hospital to third-generation cephalosporin which is he is still on right now. Patient will be following up with urology soon. Prescribed oxybutynin as needed for bladder spasms. Based on patient's report, currently do not think the patient will require a longer antibiotic course.  Notify patient that if he has any fever or chills or any other symptoms, call his primary care provider or consider revisiting the ER. Patient verbalized understanding.  Author:  Lynden Oxford, MD [facility] 12/08/2020  If 7PM-7AM, please contact night-coverage To reach On-call, see [email or web address]

## 2020-12-09 LAB — CULTURE, BLOOD (SINGLE)

## 2020-12-10 DIAGNOSIS — Z006 Encounter for examination for normal comparison and control in clinical research program: Secondary | ICD-10-CM

## 2020-12-10 NOTE — Research (Signed)
[facility]imize 4 Year Follow-up  Patient recently hospitalized with a UTI and sepsis. Medications and SAE reported at this time. No chest pain or cardiac symptoms to report.   Current Outpatient Medications:    acetaminophen (TYLENOL) 500 MG tablet, Take 500 mg by mouth every 6 (six) hours as needed for moderate pain or headache., Disp: , Rfl:    aspirin 81 MG chewable tablet, Chew 81 mg by mouth daily., Disp: , Rfl:    Besifloxacin HCl (BESIVANCE) 0.6 % SUSP, Place 1 drop into the left eye 4 (four) times daily. Sample (Patient not taking: No sig reported), Disp: , Rfl:    carvedilol (COREG) 6.25 MG tablet, Take 6.25 mg by mouth 2 (two) times daily., Disp: , Rfl:    cefdinir (OMNICEF) 300 MG capsule, Take 1 capsule (300 mg total) by mouth 2 (two) times daily for 5 days., Disp: 10 capsule, Rfl: 0   Cholecalciferol (VITAMIN D) 125 MCG (5000 UT) CAPS, Take 1 capsule by mouth daily., Disp: , Rfl:    fluticasone (FLONASE) 50 MCG/ACT nasal spray, Place 1 spray into both nostrils at bedtime., Disp: , Rfl:    indomethacin (INDOCIN) 50 MG capsule, Take 50 mg by mouth 2 (two) times daily as needed (gout flares)., Disp: , Rfl:    losartan (COZAAR) 100 MG tablet, Take 1 tablet by mouth at bedtime., Disp: , Rfl:    Multiple Minerals-Vitamins (CALCIUM-MAGNESIUM-ZINC-D3 PO), Take 1 tablet by mouth daily as needed (leg cramps)., Disp: , Rfl:    Omeprazole Magnesium (PRILOSEC OTC PO), Take 1 tablet by mouth every other day., Disp: , Rfl:    oxybutynin (DITROPAN) 5 MG tablet, Take 1 tablet (5 mg total) by mouth every 8 (eight) hours as needed for bladder spasms., Disp: 15 tablet, Rfl: 0   phenazopyridine (PYRIDIUM) 95 MG tablet, Take 1 tablet (95 mg total) by mouth 3 (three) times daily as needed for pain., Disp: 10 tablet, Rfl: 0   Potassium Gluconate 595 MG CAPS, Take 1 capsule by mouth daily as needed (leg cramps)., Disp: , Rfl:    prednisoLONE acetate (PRED FORTE) 1 % ophthalmic suspension, Place 1 drop into the  left eye 3 (three) times daily., Disp: , Rfl:    rosuvastatin (CRESTOR) 40 MG tablet, Take 1 tablet (40 mg total) by mouth daily. Please call to schedule appointment for further refills. Thank you!, Disp: 30 tablet, Rfl: 0   tamsulosin (FLOMAX) 0.4 MG CAPS capsule, Take 0.4 mg by mouth at bedtime., Disp: , Rfl:

## 2020-12-29 ENCOUNTER — Encounter (HOSPITAL_COMMUNITY): Payer: Self-pay

## 2020-12-29 ENCOUNTER — Other Ambulatory Visit: Payer: Self-pay

## 2020-12-29 ENCOUNTER — Emergency Department (HOSPITAL_COMMUNITY)
Admission: EM | Admit: 2020-12-29 | Discharge: 2020-12-29 | Disposition: A | Discharge status: Home / Self Care | Payer: Medicare Other | Attending: Emergency Medicine | Admitting: Emergency Medicine

## 2020-12-29 DIAGNOSIS — R319 Hematuria, unspecified: Secondary | ICD-10-CM | POA: Insufficient documentation

## 2020-12-29 DIAGNOSIS — R339 Retention of urine, unspecified: Secondary | ICD-10-CM | POA: Diagnosis not present

## 2020-12-29 DIAGNOSIS — I5020 Unspecified systolic (congestive) heart failure: Secondary | ICD-10-CM | POA: Diagnosis not present

## 2020-12-29 DIAGNOSIS — I2510 Atherosclerotic heart disease of native coronary artery without angina pectoris: Secondary | ICD-10-CM | POA: Diagnosis not present

## 2020-12-29 DIAGNOSIS — Z7982 Long term (current) use of aspirin: Secondary | ICD-10-CM | POA: Insufficient documentation

## 2020-12-29 DIAGNOSIS — I110 Hypertensive heart disease with heart failure: Secondary | ICD-10-CM | POA: Insufficient documentation

## 2020-12-29 DIAGNOSIS — Z79899 Other long term (current) drug therapy: Secondary | ICD-10-CM | POA: Insufficient documentation

## 2020-12-29 LAB — CBC WITH DIFFERENTIAL/PLATELET
Abs Immature Granulocytes: 0.02 10*3/uL (ref 0.00–0.07)
Basophils Absolute: 0 10*3/uL (ref 0.0–0.1)
Basophils Relative: 1 %
Eosinophils Absolute: 0.2 10*3/uL (ref 0.0–0.5)
Eosinophils Relative: 4 %
HCT: 41.2 % (ref 39.0–52.0)
Hemoglobin: 14.2 g/dL (ref 13.0–17.0)
Immature Granulocytes: 0 %
Lymphocytes Relative: 29 %
Lymphs Abs: 1.8 10*3/uL (ref 0.7–4.0)
MCH: 32.3 pg (ref 26.0–34.0)
MCHC: 34.5 g/dL (ref 30.0–36.0)
MCV: 93.6 fL (ref 80.0–100.0)
Monocytes Absolute: 0.5 10*3/uL (ref 0.1–1.0)
Monocytes Relative: 8 %
Neutro Abs: 3.6 10*3/uL (ref 1.7–7.7)
Neutrophils Relative %: 58 %
Platelets: 153 10*3/uL (ref 150–400)
RBC: 4.4 MIL/uL (ref 4.22–5.81)
RDW: 13.2 % (ref 11.5–15.5)
WBC: 6.1 10*3/uL (ref 4.0–10.5)
nRBC: 0 % (ref 0.0–0.2)

## 2020-12-29 LAB — URINALYSIS, ROUTINE W REFLEX MICROSCOPIC
Bacteria, UA: NONE SEEN
RBC / HPF: >50 RBC/hpf — ABNORMAL HIGH (ref 0–5)

## 2020-12-29 LAB — BASIC METABOLIC PANEL
Anion gap: 6 (ref 5–15)
BUN: 22 mg/dL (ref 8–23)
CO2: 25 mmol/L (ref 22–32)
Calcium: 9.3 mg/dL (ref 8.9–10.3)
Chloride: 108 mmol/L (ref 98–111)
Creatinine, Ser: 1.21 mg/dL (ref 0.61–1.24)
GFR, Estimated: >60 mL/min (ref >60)
Glucose, Bld: 92 mg/dL (ref 70–99)
Potassium: 4.4 mmol/L (ref 3.5–5.1)
Sodium: 139 mmol/L (ref 135–145)

## 2020-12-29 MED ORDER — LIDOCAINE HCL URETHRAL/MUCOSAL 2 % EX GEL: 1.0000 "application " | Freq: Once | CUTANEOUS | Status: DC

## 2020-12-29 NOTE — Discharge Instructions (Addendum)
You were seen in the emergency department today due to problems urinating.  A catheter was placed, please leave this in at all times until you are able to follow-up with urology.  Please call the urology office tomorrow morning to make a close follow-up appointment within the next couple of days.  Your kidney function was normal.  Your blood counts were normal.  We sent her urine for culture and we will call you if this is abnormal.  Return to the emergency department for any new or worsening symptoms including but not limited to problems with your catheter clogging, fever, vomiting, abdominal pain, dizziness, lightheadedness, passing out, or any other concerns.

## 2020-12-29 NOTE — ED Provider Notes (Signed)
[facility]-EMERGENCY DEPT Provider Note   CSN: [id number] Arrival date & time: 12/29/20  1040     History Chief Complaint  Patient presents with   Hematuria   Urinary Retention    Jeff Warren is a 65 y.o. male who presents to the emergency department due to hematuria over the past few days.  Patient states that he is had problems with urinary retention over the past 1 week, he has been self cathing at home at the direction of his urologist, over the past few days he started to have some hematuria.  He called the urology office and was told to come to the emergency department for possible catheter placement.  No other alleviating or aggravating factors.  He denies nausea, vomiting, abdominal pain, flank pain, or fever.  HPI     Past Medical History:  Diagnosis Date   BPH (benign prostatic hyperplasia)    CAD (coronary artery disease)    a. inferior ST elevation MI 12/05/16: cardiac cath LM nl, p-mLAD 80%, D1 CTO with right-to-left collats, LCx minor irregs,  m-dRCA 30%, dRCA 100% s/p PCI/DES, EF 45-50%, severely elevated LVEDP; b. staged PCI 12/23/16 - successful PCI/DES to p-mLAD with small diag being jailed by stent (vessel known to be subtotally occluded with R-L collats)   Dyspnea    Family history of premature CAD    a. sister passed from MI at age 44   Gout    HLD (hyperlipidemia)    Hypertension    Myocardial infarction (HCC) 2018   Systolic dysfunction    a. TTE 12/05/16: EF 50-55%, inf HK, GR1DD, mildly dilated LA    Patient Active Problem List   Diagnosis Date Noted   UTI (urinary tract infection) 12/03/2020   Benign prostatic hyperplasia with lower urinary tract symptoms 12/03/2020   Elevated blood-pressure reading without diagnosis of hypertension 12/03/2020   Hypothyroidism 12/03/2020   Acute metabolic encephalopathy 12/03/2020   Hyponatremia 12/03/2020   Severe sepsis (HCC) 12/03/2020   Coronary artery disease    CAD in native artery  12/14/2016   Systolic dysfunction 12/14/2016   Essential hypertension 12/14/2016   Mixed hyperlipidemia 12/14/2016   Family history of premature CAD 12/14/2016   Acute ST elevation myocardial infarction (STEMI) involving right coronary artery [facility])     Past Surgical History:  Procedure Laterality Date   ABDOMINOPLASTY     COLONOSCOPY WITH PROPOFOL N/A 06/29/2016   Procedure: COLONOSCOPY WITH PROPOFOL;  Surgeon: Charolett Bumpers, MD;  Location: [facility] ENDOSCOPY;  Service: Endoscopy;  Laterality: N/A;   CORONARY STENT INTERVENTION N/A 12/23/2016   Procedure: Coronary Stent Intervention;  Surgeon: Iran Ouch, MD;  Location: [facility] INVASIVE CV LAB;  Service: Cardiovascular;  Laterality: N/A;   CORONARY/GRAFT ACUTE MI REVASCULARIZATION N/A 12/05/2016   Procedure: Coronary/Graft Acute MI Revascularization;  Surgeon: Iran Ouch, MD;  Location: [facility] INVASIVE CV LAB;  Service: Cardiovascular;  Laterality: N/A;   ganglion cyst removal Right    GAS INSERTION Left 11/21/2020   Procedure: INSERTION OF GAS LEFT EYE (C3F8);  Surgeon: Stephannie Li, MD;  Location: [facility] OR;  Service: Ophthalmology;  Laterality: Left;   GAS/FLUID EXCHANGE Left 11/21/2020   Procedure: GAS/FLUID EXCHANGE;  Surgeon: Stephannie Li, MD;  Location: [facility] OR;  Service: Ophthalmology;  Laterality: Left;   LEFT HEART CATH AND CORONARY ANGIOGRAPHY N/A 12/05/2016   Procedure: Left Heart Cath and Coronary Angiography;  Surgeon: Iran Ouch, MD;  Location: [facility] INVASIVE CV LAB;  Service: Cardiovascular;  Laterality:  N/A;   NO PAST SURGERIES     Has has some cosmetic surgeries   VITRECTOMY 25 GAUGE WITH SCLERAL BUCKLE Left 11/21/2020   Procedure: VITRECTOMY 25 GAUGE WITH SCLERAL BUCKLE;  Surgeon: Stephannie LiSanders, Jason, MD;  [facility];  Service: Ophthalmology;  Laterality: Left;       Family History  Problem Relation Age of Onset   Diabetes Mellitus II Mother    Cancer Father    Diabetes Mellitus II Sister    Diabetes Mellitus  II Brother    Kidney failure Brother     Social History   Tobacco Use   Smoking status: Never   Smokeless tobacco: Never  Vaping Use   Vaping Use: Never used  Substance Use Topics   Alcohol use: No   Drug use: No    Home Medications Prior to Admission medications   Medication Sig Start Date End Date Taking? Authorizing Provider  acetaminophen (TYLENOL) 500 MG tablet Take 500 mg by mouth every 6 (six) hours as needed for moderate pain or headache.    [provider]  aspirin 81 MG chewable tablet Chew 81 mg by mouth daily. 12/08/16   [provider]  Besifloxacin HCl (BESIVANCE) 0.6 % SUSP Place 1 drop into the left eye 4 (four) times daily. Sample Patient not taking: No sig reported    [provider]  carvedilol (COREG) 6.25 MG tablet Take 6.25 mg by mouth 2 (two) times daily. 09/04/20   [provider]  Cholecalciferol (VITAMIN D) 125 MCG (5000 UT) CAPS Take 1 capsule by mouth daily.    [provider]  fluticasone (FLONASE) 50 MCG/ACT nasal spray Place 1 spray into both nostrils at bedtime.    [provider]  indomethacin (INDOCIN) 50 MG capsule Take 50 mg by mouth 2 (two) times daily as needed (gout flares).    [provider]  losartan (COZAAR) 100 MG tablet Take 1 tablet by mouth at bedtime. 11/07/20   [provider]  Multiple Minerals-Vitamins (CALCIUM-MAGNESIUM-ZINC-D3 PO) Take 1 tablet by mouth daily as needed (leg cramps).    [provider]  Omeprazole Magnesium (PRILOSEC OTC PO) Take 1 tablet by mouth every other day.    [provider]  oxybutynin (DITROPAN) 5 MG tablet Take 1 tablet (5 mg total) by mouth every 8 (eight) hours as needed for bladder spasms. 12/08/20   Rolly SalterPatel, Pranav M, MD  phenazopyridine (PYRIDIUM) 95 MG tablet Take 1 tablet (95 mg total) by mouth 3 (three) times daily as needed for pain. 11/22/20   Merwyn KatosBradler, Evan K, MD  Potassium Gluconate 595 MG CAPS Take 1 capsule by  mouth daily as needed (leg cramps).    [provider]  prednisoLONE acetate (PRED FORTE) 1 % ophthalmic suspension Place 1 drop into the left eye 3 (three) times daily. 11/06/20   [provider]  rosuvastatin (CRESTOR) 40 MG tablet Take 1 tablet (40 mg total) by mouth daily. Please call to schedule appointment for further refills. Thank you! 12/21/19   Iran OuchArida, Muhammad A, MD  tamsulosin (FLOMAX) 0.4 MG CAPS capsule Take 0.4 mg by mouth at bedtime.    [provider]    Allergies    Bextra [valdecoxib]  Review of Systems   Review of Systems  Constitutional:  Negative for chills and fever.  Respiratory:  Negative for shortness of breath.   Cardiovascular:  Negative for chest pain.  Gastrointestinal:  Negative for abdominal pain, nausea and vomiting.  Genitourinary:  Positive for  difficulty urinating and hematuria. Negative for flank pain.  Neurological:  Negative for syncope.  All other systems reviewed and are negative.  Physical Exam Updated Vital Signs BP (!) 145/92 (BP Location: Right Arm)   Pulse 65   Temp 98.8 F (37.1 C) (Oral)   Resp 18   SpO2 98%   Physical Exam Vitals and nursing note reviewed.  Constitutional:      General: He is not in acute distress.    Appearance: He is well-developed. He is not toxic-appearing.  HENT:     Head: Normocephalic and atraumatic.  Eyes:     General:        Right eye: No discharge.        Left eye: No discharge.     Conjunctiva/sclera: Conjunctivae normal.  Cardiovascular:     Rate and Rhythm: Normal rate and regular rhythm.  Pulmonary:     Effort: Pulmonary effort is normal. No respiratory distress.     Breath sounds: Normal breath sounds. No wheezing, rhonchi or rales.  Abdominal:     General: There is no distension.     Palpations: Abdomen is soft.     Tenderness: There is no abdominal tenderness. There is no right CVA tenderness, left CVA tenderness, guarding or rebound.  Musculoskeletal:      Cervical back: Neck supple.  Skin:    General: Skin is warm and dry.     Findings: No rash.  Neurological:     Mental Status: He is alert.     Comments: Clear speech.   Psychiatric:        Behavior: Behavior normal.    ED Results / Procedures / Treatments   Labs (all labs ordered are listed, but only abnormal results are displayed) Labs Reviewed  URINALYSIS, ROUTINE W REFLEX MICROSCOPIC - Abnormal; Notable for the following components:      Result Value   Color, Urine RED (*)    APPearance CLOUDY (*)    Glucose, UA   (*)    Value: TEST NOT REPORTED DUE TO COLOR INTERFERENCE OF URINE PIGMENT   Hgb urine dipstick   (*)    Value: TEST NOT REPORTED DUE TO COLOR INTERFERENCE OF URINE PIGMENT   Bilirubin Urine   (*)    Value: TEST NOT REPORTED DUE TO COLOR INTERFERENCE OF URINE PIGMENT   Ketones, ur   (*)    Value: TEST NOT REPORTED DUE TO COLOR INTERFERENCE OF URINE PIGMENT   Protein, ur   (*)    Value: TEST NOT REPORTED DUE TO COLOR INTERFERENCE OF URINE PIGMENT   Nitrite   (*)    Value: TEST NOT REPORTED DUE TO COLOR INTERFERENCE OF URINE PIGMENT   Leukocytes,Ua   (*)    Value: TEST NOT REPORTED DUE TO COLOR INTERFERENCE OF URINE PIGMENT   RBC / HPF >50 (*)    All other components within normal limits  URINE CULTURE  BASIC METABOLIC PANEL  CBC WITH DIFFERENTIAL/PLATELET    EKG None  Radiology No results found.  Procedures Procedures   Medications Ordered in ED Medications - No data to display  ED Course  I have reviewed the triage vital signs and the nursing notes.  Pertinent labs & imaging results that were available during my care of the patient were reviewed by me and considered in my medical decision making (see chart for details).    MDM Rules/Calculators/A&P  Patient presents to the ED with complaints of hematuria over the past few days and 1 week of urinary retention and self cathing at home.  Nontoxic, resting comfortably,  vitals without significant abnormality.  Additional history obtained:  Additional history obtained from chart review & nursing note review.   Lab Tests:  I Ordered, reviewed, and interpreted labs, which included:  CBC/BMP: Unremarkable Urinalysis: Hematuria, sent for culture.  ED Course:  Discussed with Dr. Corky Sox with urology, recommends 16 coud catheter insertion with lidocaine jelly and can discharge home.  Catheter was inserted by nursing staff- patient tolerated well, will discharge home with this in place with urology follow up. I discussed results, treatment plan, need for follow-up, and return precautions with the patient & his significant other. Provided opportunity for questions, patient and his significant other confirmed understanding and are in agreement with plan.   Portions of this note were generated with Scientist, clinical (histocompatibility and immunogenetics). Dictation errors may occur despite best attempts at proofreading.  Final Clinical Impression(s) / ED Diagnoses Final diagnoses:  Hematuria, unspecified type  Urinary retention    Rx / DC Orders ED Discharge Orders     None        Desmond Lope 12/29/20 1926    Ernie Avena, MD 12/30/20 [phone]

## 2020-12-29 NOTE — ED Provider Notes (Signed)
Emergency Medicine Provider Triage Evaluation Note  Jeff Warren , a 65 y.o. male  was evaluated in triage.  Pt complains of hematuria over past few days. Has been self cathing at the direction of his urologist for problems with retention x 1 week, over past 48 hours started to have hematuria. Was told to come to the ED by urology for catheter placement.   Review of Systems  Positive: Hematuria, retention Negative: Fever, vomiting, abdominal pain.   Physical Exam  BP (!) 116/94 (BP Location: Left Arm)   Pulse 74   Temp 98.5 F (36.9 C) (Oral)   Resp 16   SpO2 100%  Gen:   Awake, no distress   Resp:  Normal effort  MSK:   Moves extremities without difficulty  Other:  No abdominal tenderness.   Medical Decision Making  Medically screening exam initiated at 11:51 AM.  Appropriate orders placed.  [name] was informed that the remainder of the evaluation will be completed by another provider, this initial triage assessment does not replace that evaluation, and the importance of remaining in the ED until their evaluation is complete.  Hematuria.    Desmond Lope 12/29/20 1203    Wynetta Fines, MD 12/29/20 1406

## 2020-12-29 NOTE — ED Notes (Signed)
Attempted to draw labs but unsuccessful x2.   

## 2020-12-29 NOTE — ED Triage Notes (Addendum)
Pt c/o hematuria/penile bleeding and urinary retention x 1 day.  Reports he started self cathing x 1 week ago.  Sts he takes aspirin d/t previous MI.  Sts urology sent him here to get a catheter placed.

## 2020-12-30 ENCOUNTER — Emergency Department
Admission: EM | Admit: 2020-12-30 | Discharge: 2020-12-30 | Disposition: A | Discharge status: Home / Self Care | Payer: Medicare Other | Attending: Emergency Medicine | Admitting: Emergency Medicine

## 2020-12-30 ENCOUNTER — Other Ambulatory Visit: Payer: Self-pay

## 2020-12-30 DIAGNOSIS — N401 Enlarged prostate with lower urinary tract symptoms: Secondary | ICD-10-CM

## 2020-12-30 DIAGNOSIS — E039 Hypothyroidism, unspecified: Secondary | ICD-10-CM | POA: Diagnosis not present

## 2020-12-30 DIAGNOSIS — R338 Other retention of urine: Secondary | ICD-10-CM | POA: Diagnosis not present

## 2020-12-30 DIAGNOSIS — I10 Essential (primary) hypertension: Secondary | ICD-10-CM | POA: Diagnosis not present

## 2020-12-30 DIAGNOSIS — Y846 Urinary catheterization as the cause of abnormal reaction of the patient, or of later complication, without mention of misadventure at the time of the procedure: Secondary | ICD-10-CM | POA: Diagnosis not present

## 2020-12-30 DIAGNOSIS — Z7982 Long term (current) use of aspirin: Secondary | ICD-10-CM | POA: Diagnosis not present

## 2020-12-30 DIAGNOSIS — T839XXA Unspecified complication of genitourinary prosthetic device, implant and graft, initial encounter: Secondary | ICD-10-CM

## 2020-12-30 DIAGNOSIS — I2510 Atherosclerotic heart disease of native coronary artery without angina pectoris: Secondary | ICD-10-CM | POA: Diagnosis not present

## 2020-12-30 DIAGNOSIS — T83091A Other mechanical complication of indwelling urethral catheter, initial encounter: Secondary | ICD-10-CM | POA: Diagnosis not present

## 2020-12-30 DIAGNOSIS — Z79899 Other long term (current) drug therapy: Secondary | ICD-10-CM | POA: Diagnosis not present

## 2020-12-30 NOTE — ED Provider Notes (Signed)
[facility] Emergency Department Provider Note  ____________________________________________   Event Date/Time   First MD Initiated Contact with Patient 12/30/20 2137     (approximate)  I have reviewed the triage vital signs and the nursing notes.   HISTORY  Chief Complaint Follow-up (Follow up of Foley Catheter)  HPI Jeff Warren is a 65 y.o. male with the below medical history including BPH urinary retention, had a Foley catheter placed yesterday at [facility].  He presents today after he noted decreased drainage into the collection container over the last 3 hours.  He has noted he has been passing some clots prior to that time.  He denies any fever, chills, pelvic pain, or flank pain.    Past Medical History:  Diagnosis Date   BPH (benign prostatic hyperplasia)    CAD (coronary artery disease)    a. inferior ST elevation MI 12/05/16: cardiac cath LM nl, p-mLAD 80%, D1 CTO with right-to-left collats, LCx minor irregs,  m-dRCA 30%, dRCA 100% s/p PCI/DES, EF 45-50%, severely elevated LVEDP; b. staged PCI 12/23/16 - successful PCI/DES to p-mLAD with small diag being jailed by stent (vessel known to be subtotally occluded with R-L collats)   Dyspnea    Family history of premature CAD    a. sister passed from MI at age 64   Gout    HLD (hyperlipidemia)    Hypertension    Myocardial infarction (HCC) 2018   Systolic dysfunction    a. TTE 12/05/16: EF 50-55%, inf HK, GR1DD, mildly dilated LA    Patient Active Problem List   Diagnosis Date Noted   UTI (urinary tract infection) 12/03/2020   Benign prostatic hyperplasia with lower urinary tract symptoms 12/03/2020   Elevated blood-pressure reading without diagnosis of hypertension 12/03/2020   Hypothyroidism 12/03/2020   Acute metabolic encephalopathy 12/03/2020   Hyponatremia 12/03/2020   Severe sepsis (HCC) 12/03/2020   Coronary artery disease    CAD in native artery 12/14/2016   Systolic  dysfunction 12/14/2016   Essential hypertension 12/14/2016   Mixed hyperlipidemia 12/14/2016   Family history of premature CAD 12/14/2016   Acute ST elevation myocardial infarction (STEMI) involving right coronary artery [facility])     Past Surgical History:  Procedure Laterality Date   ABDOMINOPLASTY     COLONOSCOPY WITH PROPOFOL N/A 06/29/2016   Procedure: COLONOSCOPY WITH PROPOFOL;  Surgeon: Charolett Bumpers, MD;  Location: [facility];  Service: Endoscopy;  Laterality: N/A;   CORONARY STENT INTERVENTION N/A 12/23/2016   Procedure: Coronary Stent Intervention;  Surgeon: Iran Ouch, MD;  Location: [facility] INVASIVE CV LAB;  Service: Cardiovascular;  Laterality: N/A;   CORONARY/GRAFT ACUTE MI REVASCULARIZATION N/A 12/05/2016   Procedure: Coronary/Graft Acute MI Revascularization;  Surgeon: Iran Ouch, MD;  Location: [facility] INVASIVE CV LAB;  Service: Cardiovascular;  Laterality: N/A;   ganglion cyst removal Right    GAS INSERTION Left 11/21/2020   Procedure: INSERTION OF GAS LEFT EYE (C3F8);  Surgeon: Stephannie Li, MD;  Location[facility] OR;  Service: Ophthalmology;  Laterality: Left;   GAS/FLUID EXCHANGE Left 11/21/2020   Procedure: GAS/FLUID EXCHANGE;  Surgeon: Stephannie Li, MD;  Location: [facility] OR;  Service: Ophthalmology;  Laterality: Left;   LEFT HEART CATH AND CORONARY ANGIOGRAPHY N/A 12/05/2016   Procedure: Left Heart Cath and Coronary Angiography;  Surgeon: Iran Ouch, MD;  Location: [facility] INVASIVE CV LAB;  Service: Cardiovascular;  Laterality: N/A;   NO PAST SURGERIES     Has has some cosmetic surgeries  VITRECTOMY 25 GAUGE WITH SCLERAL BUCKLE Left 11/21/2020   Procedure: VITRECTOMY 25 GAUGE WITH SCLERAL BUCKLE;  Surgeon: Stephannie Li, MD;  Location: [facility] OR;  Service: Ophthalmology;  Laterality: Left;    Prior to Admission medications   Medication Sig Start Date End Date Taking? Authorizing Provider  acetaminophen (TYLENOL) 500 MG tablet Take 500 mg by mouth every 6 (six) hours  as needed for moderate pain or headache.    [provider]  aspirin 81 MG chewable tablet Chew 81 mg by mouth daily. 12/08/16   [provider]  Besifloxacin HCl (BESIVANCE) 0.6 % SUSP Place 1 drop into the left eye 4 (four) times daily. Sample Patient not taking: No sig reported    [provider]  carvedilol (COREG) 6.25 MG tablet Take 6.25 mg by mouth 2 (two) times daily. 09/04/20   [provider]  Cholecalciferol (VITAMIN D) 125 MCG (5000 UT) CAPS Take 1 capsule by mouth daily.    [provider]  fluticasone (FLONASE) 50 MCG/ACT nasal spray Place 1 spray into both nostrils at bedtime.    [provider]  indomethacin (INDOCIN) 50 MG capsule Take 50 mg by mouth 2 (two) times daily as needed (gout flares).    [provider]  losartan (COZAAR) 100 MG tablet Take 1 tablet by mouth at bedtime. 11/07/20   [provider]  Multiple Minerals-Vitamins (CALCIUM-MAGNESIUM-ZINC-D3 PO) Take 1 tablet by mouth daily as needed (leg cramps).    [provider]  Omeprazole Magnesium (PRILOSEC OTC PO) Take 1 tablet by mouth every other day.    [provider]  oxybutynin (DITROPAN) 5 MG tablet Take 1 tablet (5 mg total) by mouth every 8 (eight) hours as needed for bladder spasms. 12/08/20   Rolly Salter, MD  phenazopyridine (PYRIDIUM) 95 MG tablet Take 1 tablet (95 mg total) by mouth 3 (three) times daily as needed for pain. 11/22/20   Merwyn Katos, MD  Potassium Gluconate 595 MG CAPS Take 1 capsule by mouth daily as needed (leg cramps).    [provider]  prednisoLONE acetate (PRED FORTE) 1 % ophthalmic suspension Place 1 drop into the left eye 3 (three) times daily. 11/06/20   [provider]  rosuvastatin (CRESTOR) 40 MG tablet Take 1 tablet (40 mg total) by mouth daily. Please call to schedule appointment for further refills. Thank you! 12/21/19   Iran Ouch, MD  tamsulosin (FLOMAX) 0.4 MG CAPS  capsule Take 0.4 mg by mouth at bedtime.    [provider]    Allergies Bextra [valdecoxib]  Family History  Problem Relation Age of Onset   Diabetes Mellitus II Mother    Cancer Father    Diabetes Mellitus II Sister    Diabetes Mellitus II Brother    Kidney failure Brother     Social History Social History   Tobacco Use   Smoking status: Never   Smokeless tobacco: Never  Vaping Use   Vaping Use: Never used  Substance Use Topics   Alcohol use: No   Drug use: No    Review of Systems  Constitutional: No fever/chills Eyes: No visual changes. ENT: No sore throat. Cardiovascular: Denies chest pain. Respiratory: Denies shortness of breath. Gastrointestinal: No abdominal pain.  No nausea, no vomiting.  No diarrhea.  No constipation. Genitourinary: Negative for dysuria.  Reports urinary retention. Musculoskeletal: Negative for back pain. Skin: Negative for rash. Neurological: Negative for headaches, focal weakness or numbness. ____________________________________________   PHYSICAL EXAM:  VITAL SIGNS: ED Triage Vitals  Enc Vitals Group     BP 12/30/20 2007 (!) 119/95     Pulse Rate 12/30/20 2007 80     Resp 12/30/20 2007 19     Temp 12/30/20 2007 98.7 F (37.1 C)     Temp Source 12/30/20 2007 Oral     SpO2 12/30/20 2007 96 %     Weight 12/30/20 2008 250 lb (113.4 kg)     Height 12/30/20 2008 6' (1.829 m)     Head Circumference --      Peak Flow --      Pain Score 12/30/20 2013 0     Pain Loc --      Pain Edu? --      Excl. in GC? --     Constitutional: Alert and oriented. Well appearing and in no acute distress. Eyes: Conjunctivae are normal. PERRL. EOMI. Head: Atraumatic. Nose: No congestion/rhinnorhea. Mouth/Throat: Mucous membranes are moist.  Oropharynx non-erythematous. Neck: No stridor.   Cardiovascular: Normal rate, regular rhythm. Grossly normal heart sounds.  Good peripheral circulation. Respiratory: Normal respiratory effort.  No  retractions. Lungs CTAB. Gastrointestinal: Soft and nontender. No distention. No abdominal bruits. No CVA tenderness. Genitourinary:  foley in place. Bloody urine collection container Musculoskeletal: No lower extremity tenderness nor edema.  No joint effusions. Neurologic:  Normal speech and language. No gross focal neurologic deficits are appreciated. No gait instability. Skin:  Skin is warm, dry and intact. No rash noted. Psychiatric: Mood and affect are normal. Speech and behavior are normal.  ____________________________________________   LABS (all labs ordered are listed, but only abnormal results are displayed)  Labs Reviewed  URINALYSIS, COMPLETE (UACMP) WITH MICROSCOPIC   ____________________________________________  EKG   ____________________________________________  RADIOLOGY I, Lissa Hoard, personally viewed and evaluated these images (plain radiographs) as part of my medical decision making, as well as reviewing the written report by the radiologist.  ED MD interpretation:    Official radiology report(s): No results found.  ____________________________________________   PROCEDURES  Procedure(s) performed (including Critical Care):  Procedures   ____________________________________________   INITIAL IMPRESSION / ASSESSMENT AND PLAN / ED COURSE  As part of my medical decision making, I reviewed the following data within the electronic MEDICAL RECORD NUMBER     Patient with a history of BPH with urinary retention, presents with a recently placed Foley catheter.  He presented for Foley dysfunction, after he had not had any passage of urine for about 3 hours prior to arrival.  Patient has past large clot in the Foley tube and into the collection container while in the ED.  He will have his Foley flushed, and replaced if necessary.  He will follow-up with [facility] tomorrow via telephone, ahead of his scheduled appointment on  Friday.  ____________________________________________   FINAL CLINICAL IMPRESSION(S) / ED DIAGNOSES  Final diagnoses:  Foley catheter problem, initial encounter [facility])  Urinary retention due to benign prostatic hyperplasia     ED Discharge Orders     None        Note:  This document was prepared using Dragon voice recognition software and may include unintentional dictation errors. good   Lissa Hoard, PA-C 12/30/20 2341    Phineas Semen, MD 12/31/20 [phone]

## 2020-12-30 NOTE — Discharge Instructions (Addendum)
Follow-up with [facility] as discussed.

## 2020-12-30 NOTE — ED Triage Notes (Signed)
Pt states coming in as he had a urinary catheter placed yesterday at [facility] and prior to that been self cathing for a week. Pt states no urine output in the last 3 hours.  Bladder scan showed 78mL. Pt states those are not always accurate on him

## 2020-12-31 LAB — URINE CULTURE: Culture: >=100000 — AB

## 2021-01-01 ENCOUNTER — Telehealth: Payer: Self-pay

## 2021-01-01 NOTE — Progress Notes (Signed)
ED Antimicrobial Stewardship Positive Culture Follow Up   Jeff Warren is an 65 y.o. male who presented to [facility] on 12/30/2020 with a chief complaint of  Chief Complaint  Patient presents with   Follow-up    Follow up of Foley Catheter    Recent Results (from the past 720 hour(s))  Blood culture (routine single)     Status: Abnormal   Collection Time: 12/03/20  6:10 PM   Specimen: BLOOD  Result Value Ref Range Status   Specimen Description BLOOD RIGHT ANTECUBITAL  Final   Special Requests   Final    BOTTLES DRAWN AEROBIC AND ANAEROBIC Blood Culture results may not be optimal due to an inadequate volume of blood received in culture bottles   Culture  Setup Time   Final    AEROBIC BOTTLE ONLY GRAM NEGATIVE RODS CRITICAL RESULT CALLED TO, READ BACK BY AND VERIFIED WITH: P,PATEL MD @0838  12/07/20 EB     Culture CITROBACTER KOSERI (A)  Final   Report Status 12/09/2020 FINAL  Final   Organism ID, Bacteria CITROBACTER KOSERI  Final      Susceptibility   Citrobacter koseri - MIC*    CEFAZOLIN <=4 SENSITIVE Sensitive     CEFEPIME <=0.12 SENSITIVE Sensitive     CEFTAZIDIME <=1 SENSITIVE Sensitive     CEFTRIAXONE <=0.25 SENSITIVE Sensitive     CIPROFLOXACIN <=0.25 SENSITIVE Sensitive     GENTAMICIN <=1 SENSITIVE Sensitive     IMIPENEM <=0.25 SENSITIVE Sensitive     TRIMETH/SULFA <=20 SENSITIVE Sensitive     PIP/TAZO <=4 SENSITIVE Sensitive     * CITROBACTER KOSERI  Blood Culture ID Panel (Reflexed)     Status: Abnormal   Collection Time: 12/03/20  6:10 PM  Result Value Ref Range Status   Enterococcus faecalis NOT DETECTED NOT DETECTED Final   Enterococcus Faecium NOT DETECTED NOT DETECTED Final   Listeria monocytogenes NOT DETECTED NOT DETECTED Final   Staphylococcus species NOT DETECTED NOT DETECTED Final   Staphylococcus aureus (BCID) NOT DETECTED NOT DETECTED Final   Staphylococcus epidermidis NOT DETECTED NOT DETECTED Final   Staphylococcus lugdunensis NOT DETECTED NOT  DETECTED Final   Streptococcus species NOT DETECTED NOT DETECTED Final   Streptococcus agalactiae NOT DETECTED NOT DETECTED Final   Streptococcus pneumoniae NOT DETECTED NOT DETECTED Final   Streptococcus pyogenes NOT DETECTED NOT DETECTED Final   A.calcoaceticus-baumannii NOT DETECTED NOT DETECTED Final   Bacteroides fragilis NOT DETECTED NOT DETECTED Final   Enterobacterales DETECTED (A) NOT DETECTED Final    Comment: Enterobacterales represent a large order of gram negative bacteria, not a single organism. Refer to culture for further identification. CRITICAL RESULT CALLED TO, READ BACK BY AND VERIFIED WITH: P,PATEL MD @0838  12/07/20 EB     Enterobacter cloacae complex NOT DETECTED NOT DETECTED Final   Escherichia coli NOT DETECTED NOT DETECTED Final   Klebsiella aerogenes NOT DETECTED NOT DETECTED Final   Klebsiella oxytoca NOT DETECTED NOT DETECTED Final   Klebsiella pneumoniae NOT DETECTED NOT DETECTED Final   Proteus species NOT DETECTED NOT DETECTED Final   Salmonella species NOT DETECTED NOT DETECTED Final   Serratia marcescens NOT DETECTED NOT DETECTED Final   Haemophilus influenzae NOT DETECTED NOT DETECTED Final   Neisseria meningitidis NOT DETECTED NOT DETECTED Final   Pseudomonas aeruginosa NOT DETECTED NOT DETECTED Final   Stenotrophomonas maltophilia NOT DETECTED NOT DETECTED Final   Candida albicans NOT DETECTED NOT DETECTED Final   Candida auris NOT DETECTED NOT DETECTED Final   Candida  glabrata NOT DETECTED NOT DETECTED Final   Candida krusei NOT DETECTED NOT DETECTED Final   Candida parapsilosis NOT DETECTED NOT DETECTED Final   Candida tropicalis NOT DETECTED NOT DETECTED Final   Cryptococcus neoformans/gattii NOT DETECTED NOT DETECTED Final   CTX-M ESBL NOT DETECTED NOT DETECTED Final   Carbapenem resistance IMP NOT DETECTED NOT DETECTED Final   Carbapenem resistance KPC NOT DETECTED NOT DETECTED Final   Carbapenem resistance NDM NOT DETECTED NOT DETECTED Final    Carbapenem resist OXA 48 LIKE NOT DETECTED NOT DETECTED Final   Carbapenem resistance VIM NOT DETECTED NOT DETECTED Final    Comment: Performed at [facility], [address], [place], [place] [address]  Resp Panel by RT-PCR (Flu A&B, Covid) Nasopharyngeal Swab     Status: None   Collection Time: 12/03/20  6:50 PM   Specimen: Nasopharyngeal Swab; Nasopharyngeal(NP) swabs in vial transport medium  Result Value Ref Range Status   SARS Coronavirus 2 by RT PCR NEGATIVE NEGATIVE Final    Comment: (NOTE) SARS-CoV-2 target nucleic acids are NOT DETECTED.  The SARS-CoV-2 RNA is generally detectable in upper respiratory specimens during the acute phase of infection. The lowest concentration of SARS-CoV-2 viral copies this assay can detect is 138 copies/mL. A negative result does not preclude SARS-Cov-2 infection and should not be used as the sole basis for treatment or other patient management decisions. A negative result may occur with  improper specimen collection/handling, submission of specimen other than nasopharyngeal swab, presence of viral mutation(s) within the areas targeted by this assay, and inadequate number of viral copies(<138 copies/mL). A negative result must be combined with clinical observations, patient history, and epidemiological information. The expected result is Negative.  Fact Sheet for Patients:  BloggerCourse.com  Fact Sheet for Healthcare Providers:  SeriousBroker.it  This test is no t yet approved or cleared by the [place] FDA and  has been authorized for detection and/or diagnosis of SARS-CoV-2 by FDA under an Emergency Use Authorization (EUA). This EUA will remain  in effect (meaning this test can be used) for the duration of the COVID-19 declaration under Section 564(b)(1) of the Act, 21 U.S.C.section 360bbb-3(b)(1), unless the authorization is terminated  or revoked sooner.        Influenza A by PCR NEGATIVE NEGATIVE Final   Influenza B by PCR NEGATIVE NEGATIVE Final    Comment: (NOTE) The Xpert Xpress SARS-CoV-2/FLU/RSV plus assay is intended as an aid in the diagnosis of influenza from Nasopharyngeal swab specimens and should not be used as a sole basis for treatment. Nasal washings and aspirates are unacceptable for Xpert Xpress SARS-CoV-2/FLU/RSV testing.  Fact Sheet for Patients: BloggerCourse.com  Fact Sheet for Healthcare Providers: SeriousBroker.it  This test is not yet approved or cleared by the [place] FDA and has been authorized for detection and/or diagnosis of SARS-CoV-2 by FDA under an Emergency Use Authorization (EUA). This EUA will remain in effect (meaning this test can be used) for the duration of the COVID-19 declaration under Section 564(b)(1) of the Act, 21 U.S.C. section 360bbb-3(b)(1), unless the authorization is terminated or revoked.  Performed at [facility], [address], [place], [place] [address]   Urine culture     Status: Abnormal   Collection Time: 12/03/20  7:15 PM   Specimen: In/Out Cath Urine  Result Value Ref Range Status   Specimen Description IN/OUT CATH URINE  Final   Special Requests   Final    SUPRAPUBIC Performed at [facility], [address] [place], [place] [address]    Culture >=100,000 COLONIES/mL CITROBACTER KOSERI (A)  Final   Report Status 12/06/2020 FINAL  Final   Organism ID, Bacteria CITROBACTER KOSERI (A)  Final      Susceptibility   Citrobacter koseri - MIC*    CEFAZOLIN <=4 SENSITIVE Sensitive     CEFEPIME <=0.12 SENSITIVE Sensitive     CEFTRIAXONE <=0.25 SENSITIVE Sensitive     CIPROFLOXACIN <=0.25 SENSITIVE Sensitive     GENTAMICIN <=1 SENSITIVE Sensitive     IMIPENEM <=0.25 SENSITIVE Sensitive     NITROFURANTOIN <=16 SENSITIVE Sensitive     TRIMETH/SULFA <=20 SENSITIVE Sensitive     PIP/TAZO <=4 SENSITIVE Sensitive      * >=100,000 COLONIES/mL CITROBACTER KOSERI  Urine Culture     Status: Abnormal   Collection Time: 12/29/20 12:28 PM   Specimen: Urine, Clean Catch  Result Value Ref Range Status   Specimen Description   Final    URINE, CLEAN CATCH Performed at [facility], [address] [place], [place] [address]    Special Requests   Final    NONE Performed at [facility], [address] [place], [place] [address]    Culture >=100,000 COLONIES/mL ENTEROCOCCUS FAECALIS (A)  Final   Report Status 12/31/2020 FINAL  Final   Organism ID, Bacteria ENTEROCOCCUS FAECALIS (A)  Final      Susceptibility   Enterococcus faecalis - MIC*    AMPICILLIN <=2 SENSITIVE Sensitive     NITROFURANTOIN <=16 SENSITIVE Sensitive     VANCOMYCIN 1 SENSITIVE Sensitive     * >=100,000 COLONIES/mL ENTEROCOCCUS FAECALIS    No treatment needed at this time. Please fax to [facility].  ED Provider: Benjiman Core, MD  Andree Coss, PharmD Candidate 01/01/2021, 11:43 AM

## 2021-01-01 NOTE — Telephone Encounter (Signed)
Post ED Visit - Positive Culture Follow-up  Culture report reviewed by antimicrobial stewardship pharmacist: [facility] []  , Pharm.D. []  Enzo Bi, Pharm.D., BCPS AQ-ID []  , Pharm.D., BCPS []  Celedonio Miyamoto, .D., BCPS []  [place], .D., BCPS, AAHIVP []  Georgina Pillion, Pharm.D., BCPS, AAHIVP []  [address], PharmD, BCPS []  , PharmD, BCPS []  [place], PharmD, BCPS []  [address], PharmD []  , PharmD, BCPS []  Estella Husk, PharmD  Pharmacy Team [x]  Lysle Pearl, PharmD []  , PharmD []  Phillips Climes, PharmD []  , Rph []  Agapito Games) , PharmD []  Verlan Friends, PharmD []  , PharmD []  Mervyn Gay, PharmD []  , PharmD []  Vinnie Level, PharmD []  [facility], PharmD []  , PharmD []  Lucky Cowboy, PharmD   Positive urine culture No treatment needed at this time per Dr. , MD and no further patient follow-up is required at this time.  Greer Pickerel 01/01/2021, 12:35 PM

## 2021-02-06 ENCOUNTER — Ambulatory Visit (INDEPENDENT_AMBULATORY_CARE_PROVIDER_SITE_OTHER): Payer: Medicare Other | Admitting: Cardiovascular Disease

## 2021-02-06 ENCOUNTER — Other Ambulatory Visit: Payer: Self-pay

## 2021-02-06 ENCOUNTER — Encounter: Payer: Self-pay | Admitting: Cardiovascular Disease

## 2021-02-06 VITALS — BP 120/70 | HR 76 | Ht 73.0 in | Wt 244.4 lb

## 2021-02-06 DIAGNOSIS — Z8679 Personal history of other diseases of the circulatory system: Secondary | ICD-10-CM | POA: Diagnosis not present

## 2021-02-06 DIAGNOSIS — R0602 Shortness of breath: Secondary | ICD-10-CM

## 2021-02-06 DIAGNOSIS — E785 Hyperlipidemia, unspecified: Secondary | ICD-10-CM

## 2021-02-06 DIAGNOSIS — I10 Essential (primary) hypertension: Secondary | ICD-10-CM | POA: Diagnosis not present

## 2021-02-06 DIAGNOSIS — I2510 Atherosclerotic heart disease of native coronary artery without angina pectoris: Secondary | ICD-10-CM | POA: Diagnosis not present

## 2021-02-06 NOTE — Patient Instructions (Signed)
Medication Instructions:  Your physician recommends that you continue on your current medications as directed. Please refer to the Current Medication list given to you today.  *If you need a refill on your cardiac medications before your next appointment, please call your pharmacy*   Lab Work: Lipid and Lft today  If you have labs (blood work) drawn today and your tests are completely normal, you will receive your results only by: MyChart Message (if you have MyChart) OR A paper copy in the mail If you have any lab test that is abnormal or we need to change your treatment, we will call you to review the results.   Testing/Procedures: Your physician has requested that you have an echocardiogram. Echocardiography is a painless test that uses sound waves to create images of your heart. It provides your doctor with information about the size and shape of your heart and how well your heart's chambers and valves are working. This procedure takes approximately one hour. There are no restrictions for this procedure.    Follow-Up: At [facility], you and your health needs are our priority.  As part of our continuing mission to provide you with exceptional heart care, we have created designated Provider Care Teams.  These Care Teams include your primary Cardiologist (physician) and Advanced Practice Providers (APPs -  Physician Assistants and Nurse Practitioners) who all work together to provide you with the care you need, when you need it.  We recommend signing up for the patient portal called "MyChart".  Sign up information is provided on this After Visit Summary.  MyChart is used to connect with patients for Virtual Visits (Telemedicine).  Patients are able to view lab/test results, encounter notes, upcoming appointments, etc.  Non-urgent messages can be sent to your provider as well.   To learn more about what you can do with MyChart, go to [email or web address].    Your next appointment:    Your physician wants you to follow-up in: 1 year You will receive a reminder letter in the mail two months in advance. If you don't receive a letter, please call our office to schedule the follow-up appointment.   The format for your next appointment:   In Person  Provider:   You may see [name], MD or one of the following Advanced Practice Providers on your designated Care Team:   Nicolasa Ducking, NP Eula Listen, PA-C Marisue Ivan, PA-C Cadence Fransico Michael, [place]   Other Instructions N/A

## 2021-02-06 NOTE — Progress Notes (Signed)
Cardiology Office Note   Date:  02/06/2021   ID:  Jeff Warren, DOB 1956/02/14, MRN [id number]  PCP:  [name], MD  Cardiologist:   [name], MD   Chief Complaint  Patient presents with   Other    OD f/u no complaints today. Meds reviewed verbally with pt.      History of Present Illness: Jeff Warren is a 65 y.o. male who presents for a follow-up visit regarding coronary artery disease.  He had inferior ST elevation myocardial infarction in June 2018.  Emergent cardiac catheterization showed occluded distal right coronary artery which was treated successfully with PCI and drug-eluting stent placement.  There was 80% proximal LAD stenosis which was subsequently treated with staged PCI.    He has known history of severe hyperlipidemia with intolerance to higher dose of atorvastatin.    He has not been seen in 2 years.  He has been doing reasonably well from a cardiac standpoint with no chest pain.  However, he reports worsening dyspnea and fatigue with intermittent leg edema.  He is concerned about heart failure.  He had eye surgery done which was complicated by urine retention related to enlarged prostate.  Due to that, he has an indwelling catheter in place.  He will require prostate biopsy in the near future. He stopped taking Zetia due to cost.    Past Medical History:  Diagnosis Date   BPH (benign prostatic hyperplasia)    CAD (coronary artery disease)    a. inferior ST elevation MI 12/05/16: cardiac cath LM nl, p-mLAD 80%, D1 CTO with right-to-left collats, LCx minor irregs,  m-dRCA 30%, dRCA 100% s/p PCI/DES, EF 45-50%, severely elevated LVEDP; b. staged PCI 12/23/16 - successful PCI/DES to p-mLAD with small diag being jailed by stent (vessel known to be subtotally occluded with R-L collats)   Dyspnea    Family history of premature CAD    a. sister passed from MI at age 67   Gout    HLD (hyperlipidemia)    Hypertension    Myocardial infarction (HCC) 2018   Systolic  dysfunction    a. TTE 12/05/16: EF 50-55%, inf HK, GR1DD, mildly dilated LA    Past Surgical History:  Procedure Laterality Date   ABDOMINOPLASTY     COLONOSCOPY WITH PROPOFOL N/A 06/29/2016   Procedure: COLONOSCOPY WITH PROPOFOL;  Surgeon: Charolett Bumpers, MD;  Location: [facility] ENDOSCOPY;  Service: Endoscopy;  Laterality: N/A;   CORONARY STENT INTERVENTION N/A 12/23/2016   Procedure: Coronary Stent Intervention;  Surgeon: Iran Ouch, MD;  Location: [facility] INVASIVE CV LAB;  Service: Cardiovascular;  Laterality: N/A;   CORONARY/GRAFT ACUTE MI REVASCULARIZATION N/A 12/05/2016   Procedure: Coronary/Graft Acute MI Revascularization;  Surgeon: Iran Ouch, MD;  Location: [facility] INVASIVE CV LAB;  Service: Cardiovascular;  Laterality: N/A;   ganglion cyst removal Right    GAS INSERTION Left 11/21/2020   Procedure: INSERTION OF GAS LEFT EYE (C3F8);  Surgeon: Stephannie Li, MD;  Location: [facility] OR;  Service: Ophthalmology;  Laterality: Left;   GAS/FLUID EXCHANGE Left 11/21/2020   Procedure: GAS/FLUID EXCHANGE;  Surgeon: Stephannie Li, MD;  Location: [facility] OR;  Service: Ophthalmology;  Laterality: Left;   LEFT HEART CATH AND CORONARY ANGIOGRAPHY N/A 12/05/2016   Procedure: Left Heart Cath and Coronary Angiography;  Surgeon: Iran Ouch, MD;  Location: [facility] INVASIVE CV LAB;  Service: Cardiovascular;  Laterality: N/A;   NO PAST SURGERIES     Has has some cosmetic surgeries  VITRECTOMY 25 GAUGE WITH SCLERAL BUCKLE Left 11/21/2020   Procedure: VITRECTOMY 25 GAUGE WITH SCLERAL BUCKLE;  Surgeon: Stephannie Li, MD;  Location: [facility] OR;  Service: Ophthalmology;  Laterality: Left;     Current Outpatient Medications  Medication Sig Dispense Refill   aspirin 81 MG chewable tablet Chew 81 mg by mouth daily.     carvedilol (COREG) 6.25 MG tablet Take 6.25 mg by mouth 2 (two) times daily.     fluticasone (FLONASE) 50 MCG/ACT nasal spray Place 1 spray into both nostrils at bedtime.     indomethacin (INDOCIN) 50  MG capsule Take 50 mg by mouth 2 (two) times daily as needed (gout flares).     losartan (COZAAR) 100 MG tablet Take 1 tablet by mouth at bedtime.     Omeprazole Magnesium (PRILOSEC OTC PO) Take 1 tablet by mouth every other day.     oxybutynin (DITROPAN) 5 MG tablet Take 1 tablet (5 mg total) by mouth every 8 (eight) hours as needed for bladder spasms. 15 tablet 0   Potassium Gluconate 595 MG CAPS Take 1 capsule by mouth daily as needed (leg cramps).     rosuvastatin (CRESTOR) 40 MG tablet Take 1 tablet (40 mg total) by mouth daily. Please call to schedule appointment for further refills. Thank you! 30 tablet 0   SULFAMETHOXAZOLE-TRIMETHOPRIM PO Take by mouth 2 (two) times daily.     tamsulosin (FLOMAX) 0.4 MG CAPS capsule Take 0.4 mg by mouth at bedtime.     acetaminophen (TYLENOL) 500 MG tablet Take 500 mg by mouth every 6 (six) hours as needed for moderate pain or headache. (Patient not taking: Reported on 02/06/2021)     Besifloxacin HCl (BESIVANCE) 0.6 % SUSP Place 1 drop into the left eye 4 (four) times daily. Sample (Patient not taking: No sig reported)     Cholecalciferol (VITAMIN D) 125 MCG (5000 UT) CAPS Take 1 capsule by mouth daily. (Patient not taking: Reported on 02/06/2021)     Multiple Minerals-Vitamins (CALCIUM-MAGNESIUM-ZINC-D3 PO) Take 1 tablet by mouth daily as needed (leg cramps). (Patient not taking: Reported on 02/06/2021)     phenazopyridine (PYRIDIUM) 95 MG tablet Take 1 tablet (95 mg total) by mouth 3 (three) times daily as needed for pain. (Patient not taking: Reported on 02/06/2021) 10 tablet 0   prednisoLONE acetate (PRED FORTE) 1 % ophthalmic suspension Place 1 drop into the left eye 3 (three) times daily. (Patient not taking: Reported on 02/06/2021)     No current facility-administered medications for this visit.    Allergies:   Bextra [valdecoxib]    Social History:  The patient  reports that he has never smoked. He has never used smokeless tobacco. He reports that he does  not drink alcohol and does not use drugs.   Family History:  The patient's family history includes Cancer in his father; Diabetes Mellitus II in his brother, mother, and sister; Kidney failure in his brother.    ROS:  Please see the history of present illness.   Otherwise, review of systems are positive for none.   All other systems are reviewed and negative.    PHYSICAL EXAM: VS:  BP 120/70 (BP Location: Left Arm, Patient Position: Sitting, Cuff Size: Normal)   Pulse 76   Ht 6\' 1"  (1.854 m)   Wt 244 lb 6 oz (110.8 kg)   SpO2 98%   BMI 32.24 kg/m  , BMI Body mass index is 32.24 kg/m. GEN: Well nourished, well developed, in no acute  distress  HEENT: normal  Neck: no JVD, carotid bruits, or masses Cardiac: RRR; no murmurs, rubs, or gallops,no edema  Respiratory:  clear to auscultation bilaterally, normal work of breathing GI: soft, nontender, nondistended, + BS MS: no deformity or atrophy  Skin: warm and dry, no rash Neuro:  Strength and sensation are intact Psych: euthymic mood, full affect   EKG:  EKG is ordered today. The ekg ordered today demonstrates sinus rhythm with PVCs and left anterior fascicular block.   Recent Labs: 12/04/2020: ALT 24 12/29/2020: BUN 22; Creatinine, Ser 1.21; Hemoglobin 14.2; Platelets 153; Potassium 4.4; Sodium 139    Lipid Panel    Component Value Date/Time   CHOL 131 10/06/2017 0934   TRIG 129 10/06/2017 0934   HDL 40 10/06/2017 0934   CHOLHDL 4.0 01/08/2017 1035   CHOLHDL 6.0 12/06/2016 0546   VLDL 34 12/06/2016 0546   LDLCALC 65 10/06/2017 0934      Wt Readings from Last 3 Encounters:  02/06/21 244 lb 6 oz (110.8 kg)  12/30/20 250 lb (113.4 kg)  12/05/20 247 lb 2.2 oz (112.1 kg)       No flowsheet data found.    ASSESSMENT AND PLAN:  1.  Coronary artery disease involving native coronary arteries without angina: He is doing reasonably well with no chest pain but reports exertional dyspnea and intermittent leg edema.  Thus,  I requested an echocardiogram.   2.  Previous ischemic cardiomyopathy: Most recent echocardiogram in 2019 showed normal LV systolic function with no significant valvular abnormalities.  Continue carvedilol and losartan.  Given his symptoms, I ordered a follow-up echocardiogram.  3.  Hyperlipidemia: He is currently on high-dose rosuvastatin.  He stopped Zetia due to cost.  I requested lipid and liver profile.  4.  Essential hypertension: Blood pressures controlled on current medications.   Disposition:   FU with me in 12 months  Signed,  [name], MD  02/06/2021 9:14 AM    [facility]

## 2021-02-07 LAB — HEPATIC FUNCTION PANEL
ALT: 18 IU/L (ref 0–44)
AST: 22 IU/L (ref 0–40)
Albumin: 4.2 g/dL (ref 3.8–4.8)
Alkaline Phosphatase: 81 IU/L (ref 44–121)
Bilirubin Total: 0.5 mg/dL (ref 0.0–1.2)
Bilirubin, Direct: 0.14 mg/dL (ref 0.00–0.40)
Total Protein: 6.9 g/dL (ref 6.0–8.5)

## 2021-02-07 LAB — LIPID PANEL
Chol/HDL Ratio: 4.5 ratio (ref 0.0–5.0)
Cholesterol, Total: 152 mg/dL (ref 100–199)
HDL: 34 mg/dL — ABNORMAL LOW
LDL Chol Calc (NIH): 90 mg/dL (ref 0–99)
Triglycerides: 161 mg/dL — ABNORMAL HIGH (ref 0–149)
VLDL Cholesterol Cal: 28 mg/dL (ref 5–40)

## 2021-02-11 ENCOUNTER — Telehealth: Payer: Self-pay

## 2021-02-11 NOTE — Telephone Encounter (Signed)
-----   Message from Muhammad A Arida, MD sent at 02/11/2021 11:39 AM EDT ----- Inform patient that labs were normal. Cholesterol was good but his LDL was 90 and not at target of less than 70.  I recommend that he resume Zetia 10 mg daily.  He stopped it before due to cost but he should be able to get it at a reasonable price with good Rx given that its generic.  

## 2021-02-11 NOTE — Telephone Encounter (Signed)
Called to give the patient lab results and Dr. Arida's recommendation. Lmtcb. 

## 2021-02-17 NOTE — Telephone Encounter (Signed)
-----   Message from Iran Ouch, MD sent at 02/11/2021 11:39 AM EDT ----- Inform patient that labs were normal. Cholesterol was good but his LDL was 90 and not at target of less than 70.  I recommend that he resume Zetia 10 mg daily.  He stopped it before due to cost but he should be able to get it at a reasonable price with good Rx given that its generic.

## 2021-02-17 NOTE — Telephone Encounter (Signed)
2nd attempt to contact the patient. Lmtcb. 

## 2021-02-26 ENCOUNTER — Ambulatory Visit (INDEPENDENT_AMBULATORY_CARE_PROVIDER_SITE_OTHER): Payer: Medicare Other

## 2021-02-26 ENCOUNTER — Other Ambulatory Visit: Payer: Self-pay

## 2021-02-26 DIAGNOSIS — R0602 Shortness of breath: Secondary | ICD-10-CM | POA: Diagnosis not present

## 2021-02-26 LAB — ECHOCARDIOGRAM COMPLETE
AR max vel: 5.02 cm2
AV Area VTI: 4.46 cm2
AV Area mean vel: 5.13 cm2
AV Mean grad: 2 mmHg
AV Peak grad: 4.2 mmHg
Ao pk vel: 1.03 m/s
Area-P 1/2: 4.29 cm2
Calc EF: 47.3 %
MV VTI: 2.93 cm2
S' Lateral: 4.4 cm
Single Plane A2C EF: 50.3 %
Single Plane A4C EF: 48.3 %

## 2021-02-27 NOTE — Telephone Encounter (Signed)
-----   Message from Iran Ouch, MD sent at 02/27/2021  2:41 PM EDT ----- Inform patient that echo showed mildly reduced ejection fraction due to his previous myocardial infarction.  Continue same medications.  If his exertional dyspnea worsens, he should let us know.

## 2021-02-27 NOTE — Telephone Encounter (Signed)
Called to give the patient echo results. Lmtcb. 

## 2021-03-05 NOTE — Telephone Encounter (Signed)
2nd attempt to contact the patient with echo results. Lmtcb. ?

## 2021-03-06 MED ORDER — EZETIMIBE 10 MG PO TABS: 10.0000 mg | ORAL_TABLET | Freq: Every day | ORAL | 3 refills | Status: DC

## 2021-03-06 NOTE — Telephone Encounter (Signed)
Patient made aware of echo and lab results with verbalized understanding. Patient is agreeable with resuming Zetia 10 mg daily. An Rx has been sent to the patients pharmacy. Patient voiced appreciation for the call.

## 2021-03-06 NOTE — Telephone Encounter (Signed)
Patient returning call for results.  Try Cell [phone]. If no ans ok to leave detailed msg with [name] recommendations and results .

## 2021-03-12 ENCOUNTER — Ambulatory Visit (INDEPENDENT_AMBULATORY_CARE_PROVIDER_SITE_OTHER): Payer: Medicare Other | Admitting: Urology

## 2021-03-12 ENCOUNTER — Other Ambulatory Visit: Payer: Self-pay

## 2021-03-12 ENCOUNTER — Telehealth: Payer: Self-pay | Admitting: Urology

## 2021-03-12 ENCOUNTER — Other Ambulatory Visit: Payer: Self-pay | Admitting: Urology

## 2021-03-12 ENCOUNTER — Encounter: Payer: Self-pay | Admitting: Urology

## 2021-03-12 VITALS — BP 155/99 | HR 75 | Ht 72.0 in | Wt 252.0 lb

## 2021-03-12 DIAGNOSIS — Z01818 Encounter for other preprocedural examination: Secondary | ICD-10-CM

## 2021-03-12 DIAGNOSIS — R972 Elevated prostate specific antigen [PSA]: Secondary | ICD-10-CM

## 2021-03-12 DIAGNOSIS — N401 Enlarged prostate with lower urinary tract symptoms: Secondary | ICD-10-CM | POA: Diagnosis not present

## 2021-03-12 DIAGNOSIS — N138 Other obstructive and reflux uropathy: Secondary | ICD-10-CM | POA: Diagnosis not present

## 2021-03-12 NOTE — Patient Instructions (Signed)

## 2021-03-12 NOTE — H&P (View-Only) (Signed)
03/12/21 1:50 PM   Jeff Warren 08-Oct-1955 [id number]  CC: BPH, elevated PSA, urinary retention  HPI: I saw Jeff Warren in clinic for the above issues.  He is a 65 year old male with cardiac history with a long history of BPH urinary symptoms of weak stream, likely overflow incontinence, nocturia 7-10 times overnight who has been on Flomax long-term.  He developed Foley dependent urinary retention after a eye surgery, and has undergone extensive work-up with Dr. Alvester Morin in [place].  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  I reviewed the outside notes.  He currently has a Foley in place.  He previously was trying intermittent catheterization but was having problems with significant gross hematuria.   PMH: Past Medical History:  Diagnosis Date   BPH (benign prostatic hyperplasia)    CAD (coronary artery disease)    a. inferior ST elevation MI 12/05/16: cardiac cath LM nl, p-mLAD 80%, D1 CTO with right-to-left collats, LCx minor irregs,  m-dRCA 30%, dRCA 100% s/p PCI/DES, EF 45-50%, severely elevated LVEDP; b. staged PCI 12/23/16 - successful PCI/DES to p-mLAD with small diag being jailed by stent (vessel known to be subtotally occluded with R-L collats)   Dyspnea    Family history of premature CAD    a. sister passed from MI at age 20   Gout    HLD (hyperlipidemia)    Hypertension    Myocardial infarction (HCC) 2018   Systolic dysfunction    a. TTE 12/05/16: EF 50-55%, inf HK, GR1DD, mildly dilated LA    Surgical History: Past Surgical History:  Procedure Laterality Date   ABDOMINOPLASTY     COLONOSCOPY WITH PROPOFOL N/A 06/29/2016   Procedure: COLONOSCOPY WITH PROPOFOL;  Surgeon: Charolett Bumpers, MD;  Location: [facility];  Service: Endoscopy;  Laterality: N/A;   CORONARY STENT INTERVENTION N/A  12/23/2016   Procedure: Coronary Stent Intervention;  Surgeon: Iran Ouch, MD;  Location: [facility];  Service: Cardiovascular;  Laterality: N/A;   CORONARY/GRAFT ACUTE MI REVASCULARIZATION N/A 12/05/2016   Procedure: Coronary/Graft Acute MI Revascularization;  Surgeon: Iran Ouch, MD;  Location: [facility] INVASIVE CV LAB;  Service: Cardiovascular;  Laterality: N/A;   ganglion cyst removal Right    GAS INSERTION Left 11/21/2020   Procedure: INSERTION OF GAS LEFT EYE (C3F8);  Surgeon: Stephannie Li, MD;  Location: [facility] OR;  Service: Ophthalmology;  Laterality: Left;   GAS/FLUID EXCHANGE Left 11/21/2020   Procedure: GAS/FLUID EXCHANGE;  Surgeon: Stephannie Li, MD;  Location: [facility] OR;  Service: Ophthalmology;  Laterality: Left;   LEFT HEART CATH AND CORONARY ANGIOGRAPHY N/A 12/05/2016   Procedure: Left Heart Cath and Coronary Angiography;  Surgeon: Iran Ouch, MD;  Location: [facility] INVASIVE CV LAB;  Service: Cardiovascular;  Laterality: N/A;   NO PAST SURGERIES     Has has some cosmetic surgeries   VITRECTOMY 25 GAUGE WITH SCLERAL BUCKLE Left 11/21/2020   Procedure: VITRECTOMY 25 GAUGE WITH SCLERAL BUCKLE;  Surgeon: Stephannie Li, MD;  Location: [facility] OR;  Service: Ophthalmology;  Laterality: Left;    Family History: Family History  Problem Relation Age of Onset   Diabetes Mellitus II Mother    Cancer Father    Diabetes Mellitus II Sister    Diabetes Mellitus II Brother    Kidney failure Brother     Social  History:  reports that he has never smoked. He has never used smokeless tobacco. He reports that he does not drink alcohol and does not use drugs.  Physical Exam: BP (!) 155/99   Pulse 75   Ht 6' (1.829 m)   Wt 252 lb (114.3 kg)   BMI 34.18 kg/m    Constitutional:  Alert and oriented, No acute distress. Cardiovascular: No clubbing, cyanosis, or edema. Respiratory: Normal respiratory effort, no increased work of breathing. GI: Abdomen is soft, nontender,  nondistended, no abdominal masses  Assessment & Plan:   65 year old male with long history of BPH symptoms weak stream, nocturia, and likely overflow incontinence who recently developed urinary retention after an eye surgery and has failed multiple voiding trials.  He also failed intermittent catheterization with gross hematuria requiring Foley placement.  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  We discussed options at length including chronic Foley, intermittent catheterization, HOLEP, or prostatic artery embolization.  AUA guidelines reviewed.  We discussed the risks and benefits of HoLEP at length.  The procedure requires general anesthesia and takes 1 to 2 hours, and a holmium laser is used to enucleate the prostate and push this tissue into the bladder.  A morcellator is then used to remove this tissue, which is sent for pathology.  The vast majority(>95%) of patients are able to discharge the same day with a catheter in place for 2 to 3 days, and will follow-up in clinic for a voiding trial.  We specifically discussed the risks of bleeding, infection, retrograde ejaculation, temporary urgency and urge incontinence, very low risk of long-term incontinence, urethral stricture/bladder neck contracture, pathologic evaluation of prostate tissue and possible detection of prostate cancer or other malignancy, and possible need for additional procedures.  Schedule HOLEP   Legrand Rams, MD 03/12/2021  [facility] [address], [address] [place], [place] [address] [phone]

## 2021-03-12 NOTE — Telephone Encounter (Signed)
Per Dr. Roma Schanz Patient is to be scheduled for (HOLEP) Holmium Laser Enucleation of Prostate   Mr. Jeff Warren  was seen in office by surgery coordinator's Tacey Ruiz and Efraim Kaufmann and possible surgical dates were discussed, 03/25/21 was agreed upon for surgery.  Patient was directed to call [phone] between 1-3pm the day before surgery to find out surgical arrival time.  Instructions were given not to eat or drink from midnight on the night before surgery and have a driver for the day of surgery. On the surgery day patient was instructed to enter through the Medical Mall entrance of [facility] report the Same Day Surgery desk.   Pre-Admit Testing will be in contact via phone to set up an interview with the anesthesia team to review your history and medications prior to surgery.   Reminder of this information was sent via mail to the patient.   Patient is to hold anticoag's and ASA per Dr. Roma Schanz

## 2021-03-12 NOTE — Progress Notes (Signed)
03/12/21 1:50 PM   Jeff Warren 08-Oct-1955 [id number]  CC: BPH, elevated PSA, urinary retention  HPI: I saw Jeff Warren in clinic for the above issues.  He is a 65 year old male with cardiac history with a long history of BPH urinary symptoms of weak stream, likely overflow incontinence, nocturia 7-10 times overnight who has been on Flomax long-term.  He developed Foley dependent urinary retention after a eye surgery, and has undergone extensive work-up with Dr. Alvester Morin in [place].  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  I reviewed the outside notes.  He currently has a Foley in place.  He previously was trying intermittent catheterization but was having problems with significant gross hematuria.   PMH: Past Medical History:  Diagnosis Date   BPH (benign prostatic hyperplasia)    CAD (coronary artery disease)    a. inferior ST elevation MI 12/05/16: cardiac cath LM nl, p-mLAD 80%, D1 CTO with right-to-left collats, LCx minor irregs,  m-dRCA 30%, dRCA 100% s/p PCI/DES, EF 45-50%, severely elevated LVEDP; b. staged PCI 12/23/16 - successful PCI/DES to p-mLAD with small diag being jailed by stent (vessel known to be subtotally occluded with R-L collats)   Dyspnea    Family history of premature CAD    a. sister passed from MI at age 20   Gout    HLD (hyperlipidemia)    Hypertension    Myocardial infarction (HCC) 2018   Systolic dysfunction    a. TTE 12/05/16: EF 50-55%, inf HK, GR1DD, mildly dilated LA    Surgical History: Past Surgical History:  Procedure Laterality Date   ABDOMINOPLASTY     COLONOSCOPY WITH PROPOFOL N/A 06/29/2016   Procedure: COLONOSCOPY WITH PROPOFOL;  Surgeon: Charolett Bumpers, MD;  Location: [facility];  Service: Endoscopy;  Laterality: N/A;   CORONARY STENT INTERVENTION N/A  12/23/2016   Procedure: Coronary Stent Intervention;  Surgeon: Iran Ouch, MD;  Location: [facility];  Service: Cardiovascular;  Laterality: N/A;   CORONARY/GRAFT ACUTE MI REVASCULARIZATION N/A 12/05/2016   Procedure: Coronary/Graft Acute MI Revascularization;  Surgeon: Iran Ouch, MD;  Location: [facility] INVASIVE CV LAB;  Service: Cardiovascular;  Laterality: N/A;   ganglion cyst removal Right    GAS INSERTION Left 11/21/2020   Procedure: INSERTION OF GAS LEFT EYE (C3F8);  Surgeon: Stephannie Li, MD;  Location: [facility] OR;  Service: Ophthalmology;  Laterality: Left;   GAS/FLUID EXCHANGE Left 11/21/2020   Procedure: GAS/FLUID EXCHANGE;  Surgeon: Stephannie Li, MD;  Location: [facility] OR;  Service: Ophthalmology;  Laterality: Left;   LEFT HEART CATH AND CORONARY ANGIOGRAPHY N/A 12/05/2016   Procedure: Left Heart Cath and Coronary Angiography;  Surgeon: Iran Ouch, MD;  Location: [facility] INVASIVE CV LAB;  Service: Cardiovascular;  Laterality: N/A;   NO PAST SURGERIES     Has has some cosmetic surgeries   VITRECTOMY 25 GAUGE WITH SCLERAL BUCKLE Left 11/21/2020   Procedure: VITRECTOMY 25 GAUGE WITH SCLERAL BUCKLE;  Surgeon: Stephannie Li, MD;  Location: [facility] OR;  Service: Ophthalmology;  Laterality: Left;    Family History: Family History  Problem Relation Age of Onset   Diabetes Mellitus II Mother    Cancer Father    Diabetes Mellitus II Sister    Diabetes Mellitus II Brother    Kidney failure Brother     Social  History:  reports that he has never smoked. He has never used smokeless tobacco. He reports that he does not drink alcohol and does not use drugs.  Physical Exam: BP (!) 155/99   Pulse 75   Ht 6' (1.829 m)   Wt 252 lb (114.3 kg)   BMI 34.18 kg/m    Constitutional:  Alert and oriented, No acute distress. Cardiovascular: No clubbing, cyanosis, or edema. Respiratory: Normal respiratory effort, no increased work of breathing. GI: Abdomen is soft, nontender,  nondistended, no abdominal masses  Assessment & Plan:   65 year old male with long history of BPH symptoms weak stream, nocturia, and likely overflow incontinence who recently developed urinary retention after an eye surgery and has failed multiple voiding trials.  He also failed intermittent catheterization with gross hematuria requiring Foley placement.  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  We discussed options at length including chronic Foley, intermittent catheterization, HOLEP, or prostatic artery embolization.  AUA guidelines reviewed.  We discussed the risks and benefits of HoLEP at length.  The procedure requires general anesthesia and takes 1 to 2 hours, and a holmium laser is used to enucleate the prostate and push this tissue into the bladder.  A morcellator is then used to remove this tissue, which is sent for pathology.  The vast majority(>95%) of patients are able to discharge the same day with a catheter in place for 2 to 3 days, and will follow-up in clinic for a voiding trial.  We specifically discussed the risks of bleeding, infection, retrograde ejaculation, temporary urgency and urge incontinence, very low risk of long-term incontinence, urethral stricture/bladder neck contracture, pathologic evaluation of prostate tissue and possible detection of prostate cancer or other malignancy, and possible need for additional procedures.  Schedule HOLEP   Legrand Rams, MD 03/12/2021  [facility] [address] [place], [place] [address] [phone]

## 2021-03-12 NOTE — Progress Notes (Signed)
Surgical Physician Order Form  ** Scheduling expectation :  Friday 10/14 or Tuesday 10/18  *Length of Case: 3 hours  *Clearance needed: no  *Anticoagulation Instructions: Hold all anticoagulants  *Aspirin Instructions: Hold Aspirin  *Post-op visit Date/Instructions:  1-3 day voiding trial, 10 weeks MD PVR  *Diagnosis: BPH w/urinary obstruction  *Procedure: HOLEP (00174)  -Admit type: OUTpatient  -Anesthesia: General  -VTE Prophylaxis Standing Order SCD's       Other:   -Standing Lab Orders Per Anesthesia    Lab other: UA&Urine Culture  -Standing Test orders EKG/Chest x-ray per Anesthesia       Test other:   - Medications:      Cipro 400 mg IV and ampicillin 1 g IV   Other Instructions:

## 2021-03-13 ENCOUNTER — Telehealth: Payer: Self-pay | Admitting: *Deleted

## 2021-03-13 ENCOUNTER — Telehealth: Payer: Self-pay

## 2021-03-13 LAB — URINALYSIS, COMPLETE
Bilirubin, UA: NEGATIVE
Glucose, UA: NEGATIVE
Nitrite, UA: NEGATIVE
Specific Gravity, UA: >1.03 — ABNORMAL HIGH (ref 1.005–1.030)
Urobilinogen, Ur: 1 mg/dL (ref 0.2–1.0)
pH, UA: 5.5 (ref 5.0–7.5)

## 2021-03-13 LAB — MICROSCOPIC EXAMINATION: WBC, UA: >30 /hpf — AB (ref 0–5)

## 2021-03-13 MED ORDER — NITROFURANTOIN MONOHYD MACRO 100 MG PO CAPS: 100.0000 mg | ORAL_CAPSULE | Freq: Two times a day (BID) | ORAL | 0 refills | Status: DC

## 2021-03-13 NOTE — Telephone Encounter (Signed)
Request for pre-operative cardiac clearance Received: Today Jeff Kitchens, NP  P Cv Div Preop Callback Request for pre-operative cardiac clearance:     1. What type of surgery is being performed?  HOLEP-LASER ENUCLEATION OF THE PROSTATE WITH MORCELLATION   2. When is this surgery scheduled?  03/25/2021     3. Are there any medications that need to be held prior to surgery?  ASA   4. Practice name and name of physician performing surgery?  Performing surgeon: Dr. Nickolas Madrid, MD  Requesting clearance: Honor Loh, FNP-C       5. Anesthesia type (none, local, MAC, general)? General   6. What is the office phone and fax number?    Phone: [phone]  Fax: [phone]   ATTENTION: Unable to create telephone message as per your standard workflow. Directed by [facility] providers to send requests for cardiac clearance to this pool for appropriate distribution to provider covering pre-operative clearances.   Honor Loh, MSN, APRN, FNP-C, CEN  [facility]  Peri-operative Services Nurse Practitioner  Phone: [phone]  03/13/21 9:29 AM

## 2021-03-13 NOTE — Telephone Encounter (Signed)
Medication sent to pharmacy. Patient Advised.

## 2021-03-13 NOTE — Telephone Encounter (Signed)
Surgical orders have been placed and faxed to Pre-Admit. Cardiac Clearance initiated from Dr. Jari Sportsman office per Anesthesia.

## 2021-03-13 NOTE — Telephone Encounter (Signed)
   Name: Jeff Warren  DOB: 1956/01/27  MRN: [id number]   Primary Cardiologist: [name], MD  Chart reviewed as part of pre-operative protocol coverage. Patient was contacted 03/13/2021 in reference to pre-operative risk assessment for pending surgery as outlined below.  Jeff Warren was last seen on 02/06/21 by [name]. I reached out to patient for update on how he is doing. The patient affirms he has been doing well without any new cardiac symptoms or concerns. Therefore, based on ACC/AHA guidelines, the patient would be at acceptable risk for the planned procedure without further cardiovascular testing. The patient was advised that if he develops new symptoms prior to surgery to contact our office to arrange for a follow-up visit, and he verbalized understanding.  Per [name], "He can proceed at an overall low risk.  Aspirin can be held for 5 days before surgery and should be resumed after."  I will route this recommendation to the requesting party via Epic fax function and remove from pre-op pool. Please call with questions.  Laurann Montana, PA-C 03/13/2021, 12:36 PM

## 2021-03-13 NOTE — Telephone Encounter (Signed)
He can proceed at an overall low risk.  Aspirin can be held for 5 days before surgery and should be resumed after.

## 2021-03-13 NOTE — Telephone Encounter (Signed)
-----   Message from Sondra Come, MD sent at 03/12/2021  3:36 PM EDT ----- Regarding: Start antibiotics Please start nitrofurantoin 100 mg twice daily x7 days on this patient to sterilize urine prior to HOLEP next week, thanks  Legrand Rams, MD 03/12/2021

## 2021-03-13 NOTE — Telephone Encounter (Signed)
   Patient Name: Jeff Warren  DOB: 1955/09/13 MRN: [id number]  Primary Cardiologist: [name], MD  Chart reviewed as part of pre-operative protocol coverage. Prior history of CAD with inferior STEMI 2018 s/p DES to distal RCA and staged DES to prox LAD. Saw [name] 02/06/21 at which time he was doing well without chest pain but reported worsening dyspnea, fatigue and edema. F/u 2D echo was ordered 02/26/21 showing EF 45-50%, grade 2 DD, mild hypokinesis of the left ventricular,  entire inferolateral wall, dilated IVC. Prior EF 2019 reported to be 50-55% so marginally decreased. Given recent symptoms in [place], will route to [name] for input on clearing for general anesthesia for prostate surgery and holding ASA as requested for procedure. [name] - Please route response to P CV DIV PREOP (the pre-op pool). Thank you.  Laurann Montana, PA-C 03/13/2021, 9:55 AM

## 2021-03-13 NOTE — Progress Notes (Signed)
[place] [facility] Posting Form   Surgery Date/Time: Date: 03/25/2021  Surgeon: Dr. Legrand Rams  Surgery Location: Day Surgery  Inpt ( No  )   Outpt (Yes)   Obs ( No  )   Diagnosis: N40.1, N13.8 Benign Prostatic Hyperplasia with urinary obstruction  -CPT: 52649   Surgery: HOLEP  Stop Anticoagulations: Yes; Hold Aspirin  Cardiac/Medical/Pulmonary Clearance needed: Discussed with PAT to obtain Cardiac Clearance  Clearance needed from Dr: Kirke Corin  Clearance request sent on: Date: 03/13/21   *Orders entered into EPIC  Date: 03/13/21   *Case booked in EPIC  Date: 03/13/21  *Notified pt of Surgery: Date: 03/13/21  PRE-OP UA & CX: Yes,obtained at visit on 10/05  *Placed into Prior Authorization Work Angela Nevin Date: 03/13/21   Assistant/laser/rep:No

## 2021-03-20 ENCOUNTER — Encounter
Admission: RE | Admit: 2021-03-20 | Discharge: 2021-03-20 | Disposition: A | Discharge status: Home / Self Care | Payer: Medicare Other | Source: Ambulatory Visit | Attending: Urology | Admitting: Urology

## 2021-03-20 ENCOUNTER — Other Ambulatory Visit: Payer: Self-pay

## 2021-03-20 HISTORY — DX: Gastro-esophageal reflux disease without esophagitis: K21.9

## 2021-03-20 HISTORY — DX: Retention of urine, unspecified: R33.9

## 2021-03-20 NOTE — Patient Instructions (Addendum)
Your procedure is scheduled on: 03/25/2021 Report to the Registration Desk on the 1st floor of the Medical Mall. To find out your arrival time, please call [phone] between 1PM - 3PM on: 03/24/2021  REMEMBER: Instructions that are not followed completely may result in serious medical risk, up to and including death; or upon the discretion of your surgeon and anesthesiologist your surgery may need to be rescheduled.  Do not eat or drink anything after midnight the night before surgery.  No gum chewing, lozengers or hard candies.  TAKE THESE MEDICATIONS THE MORNING OF SURGERY WITH A SIP OF WATER: - carvedilol (COREG) 6.25 MG - omeprazole (PRILOSEC OTC) 20 MG (take one the night before and one on the morning of surgery - helps to prevent nausea after surgery.)  Follow recommendations from Cardiologist, Pulmonologist or PCP regarding stopping Aspirin, Coumadin, Plavix, Eliquis, Pradaxa, or Pletal.  One week prior to surgery: Stop Anti-inflammatories (NSAIDS) such as Advil, Aleve, Ibuprofen, Motrin, Naproxen, Naprosyn and Aspirin based products such as Excedrin, Goodys Powder, BC Powder. You may however, continue to take Tylenol if needed for pain up until the day of surgery. Stop ANY OVER THE COUNTER supplements until after surgery.   No Alcohol for 24 hours before or after surgery.  No Smoking including e-cigarettes for 24 hours prior to surgery.  No chewable tobacco products for at least 6 hours prior to surgery.  No nicotine patches on the day of surgery.  Do not use any "recreational" drugs for at least a week prior to your surgery.  Please be advised that the combination of cocaine and anesthesia may have negative outcomes, up to and including death. If you test positive for cocaine, your surgery will be cancelled.  On the morning of surgery brush your teeth with toothpaste and water, you may rinse your mouth with mouthwash if you wish. Do not swallow any toothpaste or  mouthwash.  Do not wear lotions, powders, or perfumes.   Do not shave body from the neck down 48 hours prior to surgery just in case you cut yourself which could leave a site for infection.   Contact lenses, hearing aids and dentures may not be worn into surgery.  Notify your doctor if there is any change in your medical condition (cold, fever, infection).  Wear comfortable clothing (specific to your surgery type) to the hospital.  If you are being discharged the day of surgery, you will not be allowed to drive home. You will need a responsible adult (18 years or older) to drive you home and stay with you that night.   If you are taking public transportation, you will need to have a responsible adult (18 years or older) with you. Please confirm with your physician that it is acceptable to use public transportation.   Please call the Pre-admissions Testing Dept. at [phone] if you have any questions about these instructions.  Surgery Visitation Policy:  Patients undergoing a surgery or procedure may have one family member or support person with them as long as that person is not COVID-19 positive or experiencing its symptoms.  That person may remain in the waiting area during the procedure and may rotate out with other people.

## 2021-03-21 ENCOUNTER — Encounter: Payer: Self-pay | Admitting: Urology

## 2021-03-21 LAB — CULTURE, URINE COMPREHENSIVE

## 2021-03-21 NOTE — Progress Notes (Signed)
Perioperative Services  Pre-Admission/Anesthesia Testing Clinical Review  Date: 03/21/21  Patient Demographics:  Name: Jeff Warren DOB:   1955/09/22 MRN:   [id number]  Planned Surgical Procedure(s):    Case: [id number] Date/Time: 03/25/21 0715   Procedure: HOLEP-LASER ENUCLEATION OF THE PROSTATE WITH MORCELLATION   Anesthesia type: General   Pre-op diagnosis: BPH with Urinary Obstruction   Location: [facility] OR ROOM 10 / [facility]   Surgeons: Sondra Come, MD     NOTE: Available PAT nursing documentation and vital signs have been reviewed. Clinical nursing staff has updated patient's PMH/PSHx, current medication list, and drug allergies/intolerances to ensure comprehensive history available to assist in medical decision making as it pertains to the aforementioned surgical procedure and anticipated anesthetic course. Extensive review of available clinical information performed. [facility] [facility] and PSHx updated with any diagnoses/procedures that  may have been inadvertently omitted during his intake with the pre-admission testing department's nursing staff.  Clinical Discussion:  Jeff Warren is a 65 y.o. male who is submitted for pre-surgical anesthesia review and clearance prior to him undergoing the above procedure. Patient has never been a smoker. Pertinent PMH includes: CAD, STEMI, LAFB, systolic dysfunction, mild aortic root dilation, HTN, HLD, GERD (on daily PPI), dyspnea, BPH.  Patient is followed by cardiology Kirke Corin, MD). He was last seen in the cardiology clinic on 02/06/2021; notes reviewed.  At the time of his clinic visit, patient doing well overall from a cardiovascular perspective. He denied any episodes of chest pain, however reported worsening dyspnea, fatigue, and lower extremity edema.  Patient denied any PND, orthopnea, palpitations, vertiginous symptoms, or presyncope/syncope.  PMH significant for cardiovascular diagnoses.  Patient suffered an inferior  STEMI on 12/05/2016.  Emergent diagnostic left heart catheterization was performed revealing a reduced LVEF of 45%. LVEDP was severely elevated.  There was mild left ventricular systolic dysfunction.  Multivessel CAD noted; 30% stenosis of the mid to distal RCA, 80% stenosis of the proximal to mid LAD, and 100% stenosis of D1.  Of note, there were right to left collaterals and the first diagonal.  Culprit felt to be a thrombotic occlusion in his distal RCA.  Successful PCI was performed whereby a 3.5 x 15 mm resolute Onyx DES x1 was placed to the distal RCA.  Patient started on DAPT therapy with plans for staged PCI of the LAD in a few weeks.  Repeat diagnostic left heart catheterization was performed on 12/23/2016 revealing a 30% stenosis of the mid to distal RCA, 100% stenosis of D1, and 80% stenosis of the proximal and mid LAD.  PCI was performed placing a Svelte OTW DES x 1 to the proximal to mid LAD.  Small diagonal branch jailed by the stent with loss of flow, however this branch noted to be subtotally occluded with adequate collateral flow.  Since his MI, patient has undergone serial monitoring of his cardiac function.  Last TTE was performed on 02/26/2021 revealing a mildly decreased left ventricular systolic function with an EF of 45-50%.  Diastolic Doppler parameters consistent with pseudonormalization (G2DD).  There was mild inferolateral hypokinesis.  Mitral valve with mild regurgitation.  There was no evidence of valvular stenosis.  Aortic root mildly dilated at 40 mm.  Blood pressure well controlled at 120/70 on currently prescribed beta-blocker, diuretic, and ARB therapies.  Patient was on a statin + ezetimibe for his HLD. Patient advised that he had self discontinued the ezetimibe due to financial concerns. Patient is not diabetic. Functional capacity,  as defined by DASI, is documented as being >/= 4 METS. No changes were made to his medication regimen.  Patient to follow-up with outpatient  cardiology in 1 year or sooner if needed.  Jeff Warren is scheduled for an HOLEP-LASER ENUCLEATION OF THE PROSTATE WITH MORCELLATION on 03/25/2021 with Dr. Legrand Rams, MD.  Given patient's past medical history significant for cardiovascular diagnoses, presurgical cardiac clearance was sought by the PAT team. Per cardiology, "based ACC/AHA guidelines, the patient's past medical history, and the amount of time since his last clinic visit, this patient would be at an overall ACCEPTABLE LOW risk for the planned procedure without further cardiovascular testing or intervention at this time". This patient is on daily antiplatelet therapy. He has been instructed on recommendations for holding his daily low dose ASA for 5 days prior to his procedure with plans to restart as soon as postoperative bleeding risk felt to be minimized by his attending surgeon. The patient has been instructed that his last dose of his anticoagulant will be on 03/19/2021.  Patient denies previous perioperative complications with anesthesia in the past. In review of the available records, it is noted that patient underwent a general anesthetic course at [facility] (ASA III) in 11/2020 without documented complications.   Vitals with BMI 03/20/2021 03/12/2021 02/06/2021  Height 6\' 0"  6\' 0"  6\' 1"   Weight 250 lbs 252 lbs 244 lbs 6 oz  BMI 33.9 34.17 32.25  Systolic - 155 120  Diastolic - 99 70  Pulse - 75 76    Providers/Specialists:   NOTE: Primary physician provider listed below. Patient may have been seen by APP or partner within same practice.   PROVIDER ROLE / SPECIALTY LAST , MD UROLOGY (SURGEON) 03/12/2021  , MD PRIMARY CARE PROVIDER ???  Lise Auer, MD CARDIOLOGY 02/06/2021   Allergies:  Bextra [valdecoxib]  Current Home Medications:   No current facility-administered medications for this encounter.    acetaminophen (TYLENOL) 500 MG tablet    Carboxymethylcell-Hypromellose (GENTEAL OP)   carvedilol (COREG) 6.25 MG tablet   Cholecalciferol (DIALYVITE VITAMIN D 5000) 125 MCG (5000 UT) capsule   Coenzyme Q10 300 MG CAPS   ezetimibe (ZETIA) 10 MG tablet   fluticasone (FLONASE) 50 MCG/ACT nasal spray   furosemide (LASIX) 20 MG tablet   indomethacin (INDOCIN) 50 MG capsule   losartan (COZAAR) 100 MG tablet   nitrofurantoin, macrocrystal-monohydrate, (MACROBID) 100 MG capsule   NITROGLYCERIN RE   omeprazole (PRILOSEC OTC) 20 MG tablet   Potassium 99 MG TABS   rosuvastatin (CRESTOR) 40 MG tablet   aspirin EC 81 MG tablet   oxybutynin (DITROPAN) 5 MG tablet   History:   Past Medical History:  Diagnosis Date   Aortic root dilation (HCC)    a.) TTE 02/26/2021: mild; measured 40 mm.   BPH (benign prostatic hyperplasia)    CAD (coronary artery disease)    a. inferior ST elevation MI 12/05/16: cardiac cath LM nl, p-mLAD 80%, D1 CTO with right-to-left collats, LCx minor irregs,  m-dRCA 30%, dRCA 100% s/p PCI/DES, EF 45-50%, severely elevated LVEDP; b. staged PCI 12/23/16 - successful PCI/DES to p-mLAD with small diag being jailed by stent (vessel known to be subtotally occluded with R-L collats)   Dyspnea    Family history of premature CAD    a. sister passed from MI at age 39   GERD (gastroesophageal reflux disease)    Gout    HLD (hyperlipidemia)    Hypertension  LAFB (left anterior fascicular block)    ST elevation myocardial infarction (STEMI) of inferior wall (HCC) 12/05/2016   a.) LHC --> EF 45% with severely elevated LVEDP; 2v CAD; culprit was thrombotic occlusion of dRCA; D1 occluded with R-L collaterals; 80% p-mLAD. PCI performed placing a 3.5 x 15 mm Resolute Onyx DES x 1 to dRCA. Plans for staged PCI of LAD.   Systolic dysfunction    a.) TTE 12/05/16: EF 50-55%, inf HK, GR1DD, mildly dilated LA. b.) TTE 02/26/21: EF 45-50%, inferolateral HK, mild MR, G2DD.   Urinary retention    Past Surgical History:  Procedure  Laterality Date   ABDOMINOPLASTY     COLONOSCOPY WITH PROPOFOL N/A 06/29/2016   Procedure: COLONOSCOPY WITH PROPOFOL;  Surgeon: Charolett Bumpers, MD;  Location: [facility];  Service: Endoscopy;  Laterality: N/A;   CORONARY STENT INTERVENTION N/A 12/23/2016   Procedure: Coronary Stent Intervention;  Surgeon: Iran Ouch, MD;  Location: [facility] INVASIVE CV LAB;  Service: Cardiovascular;  Laterality: N/A;   CORONARY/GRAFT ACUTE MI REVASCULARIZATION N/A 12/05/2016   Procedure: Coronary/Graft Acute MI Revascularization;  Surgeon: Iran Ouch, MD;  Location: [facility] INVASIVE CV LAB;  Service: Cardiovascular;  Laterality: N/A;   ganglion cyst removal Right    GAS INSERTION Left 11/21/2020   Procedure: INSERTION OF GAS LEFT EYE (C3F8);  Surgeon: Stephannie Li, MD;  Location[facility] OR;  Service: Ophthalmology;  Laterality: Left;   GAS/FLUID EXCHANGE Left 11/21/2020   Procedure: GAS/FLUID EXCHANGE;  Surgeon: Stephannie Li, MD;  Location: [facility] OR;  Service: Ophthalmology;  Laterality: Left;   LEFT HEART CATH AND CORONARY ANGIOGRAPHY N/A 12/05/2016   Procedure: Left Heart Cath and Coronary Angiography;  Surgeon: Iran Ouch, MD;  Location: [facility] INVASIVE CV LAB;  Service: Cardiovascular;  Laterality: N/A;   NO PAST SURGERIES     Has has some cosmetic surgeries   VITRECTOMY 25 GAUGE WITH SCLERAL BUCKLE Left 11/21/2020   Procedure: VITRECTOMY 25 GAUGE WITH SCLERAL BUCKLE;  Surgeon: Stephannie Li, MD;  Location: [facility] OR;  Service: Ophthalmology;  Laterality: Left;   Family History  Problem Relation Age of Onset   Diabetes Mellitus II Mother    Cancer Father    Diabetes Mellitus II Sister    Diabetes Mellitus II Brother    Kidney failure Brother    Social History   Tobacco Use   Smoking status: Never   Smokeless tobacco: Never  Vaping Use   Vaping Use: Never used  Substance Use Topics   Alcohol use: No   Drug use: No    Pertinent Clinical Results:  LABS: Labs reviewed: Acceptable for  surgery.  Lab Results  Component Value Date   WBC 6.1 12/29/2020   HGB 14.2 12/29/2020   HCT 41.2 12/29/2020   MCV 93.6 12/29/2020   PLT 153 12/29/2020   Lab Results  Component Value Date   NA 139 12/29/2020   K 4.4 12/29/2020   CO2 25 12/29/2020   GLUCOSE 92 12/29/2020   BUN 22 12/29/2020   CREATININE 1.21 12/29/2020   CALCIUM 9.3 12/29/2020   GFRNONAA >60 12/29/2020    No visits with results within 3 Day(s) from this visit.  Latest known visit with results is:  Office Visit on 03/12/2021  Component Date Value Ref Range Status   Specific Gravity, UA 03/12/2021 >1.030 (A) 1.005 - 1.030 Final   pH, UA 03/12/2021 5.5  5.0 - 7.5 Final   Color, UA 03/12/2021 Yellow  Yellow Final   Appearance Ur 03/12/2021  Cloudy (A) Clear Final   Leukocytes,UA 03/12/2021 1+ (A) Negative Final   Protein,UA 03/12/2021 3+ (A) Negative/Trace Final   Glucose, UA 03/12/2021 Negative  Negative Final   Ketones, UA 03/12/2021 Trace (A) Negative Final   RBC, UA 03/12/2021 2+ (A) Negative Final   Bilirubin, UA 03/12/2021 Negative  Negative Final   Urobilinogen, Ur 03/12/2021 1.0  0.2 - 1.0 mg/dL Final   Nitrite, UA 09/81/1914 Negative  Negative Final   Microscopic Examination 03/12/2021 See below:   Final   Urine Culture, Comprehensive 03/12/2021 Final report (A)  Final   Organism ID, Bacteria 03/12/2021 Citrobacter koseri (A)  Final   Greater than 100,000 colony forming units per mL   Organism ID, Bacteria 03/12/2021 Enterococcus faecalis (A)  Final   Comment: For Enterococcus species, aminoglycosides (except for high-level resistance screening), cephalosporins, clindamycin, and trimethoprim-sulfamethoxazole are not effective clinically. (CLSI, M100-S26, 2016) 25,000-50,000 colony forming units per mL    Organism ID, Bacteria 03/12/2021 Comment   Final   Comment: Mixed urogenital flora 10,000-25,000 colony forming units per mL    ANTIMICROBIAL SUSCEPTIBILITY 03/12/2021 Comment   Final    Comment:       ** S = Susceptible; I = Intermediate; R = Resistant **                    P = Positive; N = Negative             MICS are expressed in micrograms per mL    Antibiotic                 RSLT#1    RSLT#2    RSLT#3    RSLT#4 Amoxicillin/Clavulanic Acid    S Cefepime                       S Ceftriaxone                    S Cefuroxime                     I Ciprofloxacin                  S         S Ertapenem                      S Gentamicin                     S Imipenem                       S Levofloxacin                   S         S Meropenem                      S Nitrofurantoin                 S         S Penicillin                               S Piperacillin/Tazobactam        S Tetracycline                   S  S Tobramycin                     S Trimethoprim/Sulfa             S Vancomycin                               S    WBC, UA 03/12/2021 >30 (A) 0 - 5 /hpf Final   RBC 03/12/2021 3-10 (A) 0 - 2 /hpf Final   Epithelial Cells (non renal) 03/12/2021 0-10  0 - 10 /hpf Final   Casts 03/12/2021 Present (A) None seen /lpf Final   Cast Type 03/12/2021 Hyaline casts  N/A Final   Granular casts   Bacteria, UA 03/12/2021 Many (A) None seen/Few Final    ECG: Date: 02/06/2021 Time ECG obtained: 0856 AM Rate: 76 bpm Rhythm:  Sinus rhythm with frequent PVCs; LAFB Axis (leads I and aVF): Normal GHT axis (down/up).Marland KitchenMarland KitchenEXTREME axis (down/down) Intervals: PR 162 ms. QRS 100 ms. QTc 474 ms. ST segment and T wave changes: No evidence of acute ST segment elevation or depression Comparison: Similar to previous tracing obtained on 12/03/2020   IMAGING / PROCEDURES: TRANSTHORACIC ECHOCARDIOGRAM performed on 02/26/2021 Left ventricular ejection fraction, by estimation, is 45 to 50%. Left ventricular ejection fraction by 2D MOD biplane is 47.3 %. The left  ventricle has mildly decreased function. The left ventricle demonstrates regional wall motion abnormalities. Left  ventricular diastolic  parameters are consistent with Grade II diastolic dysfunction (pseudonormalization). There is mild hypokinesis of the left ventricular,  entire inferolateral wall.  Right ventricular systolic function is normal. The right ventricular size is normal.  The mitral valve is normal in structure. Mild mitral valve regurgitation.  The aortic valve was not well visualized. Aortic valve regurgitation is not visualized.  Aortic dilatation noted. There is mild dilatation of the aortic root, measuring 40 mm.  The inferior vena cava is dilated in size with >50% respiratory variability, suggesting right atrial pressure of 8 mmHg.   DIAGNOSTIC RADIOGRAPHS CHEST PORTABLE 1 VIEW performed on 12/03/2020 Heart size and mediastinal contours are within normal limits.   Both lungs are clear. Visualized skeletal structures are unremarkable No active cardiopulmonary disease  CORONARY STENT INTERVENTION (STAGED PCI) performed on 12/23/2016 Known CAD 30% stenosis of the mid to distal RCA 100% stenosis of the D1 80% stenosis of the proximal to mid LAD Successful PCI 3.00 x 23 mm Svelte OTW DES x 1 to proximal-mid LAD with 0% residual stenosis. Small diagonal branch jailed by the stent with loss of flow. This branch was known to be subtotally occluded. With RIGHT to LEFT collaterals.    LEFT HEART CATHETERIZATION AND CORONARY ANGIOGRAPHY performed on 12/05/2016 Inferior STEMI Culprit felt to be thrombotic occlusion of the distal RCA.  D1 with CTO, however there are right to left collaterals.  LVEF 45% Severely elevated LVEDP Trivial mitral valve regurgitation Multivessel CAD 30% stenosis of the mid to distal RCA 80% stenosis of the proximal to mid LAD 20% stenosis of D1 Successful PCI 3.5 x 15 mm resolute Onyx DES x1 to the distal RCA resulting in a 0% residual stenosis.  Recommendations: DAPT therapy recommended for at least 1 year Staged PCI of the LAD in a few  weeks    Impression and Plan:  Jeff Warren has been referred for pre-anesthesia review and clearance prior to him undergoing the planned anesthetic and procedural courses. Available labs,  pertinent testing, and imaging results were personally reviewed by me. This patient has been appropriately cleared by cardiology with an overall ACCEPTABLE LOW risk of significant perioperative cardiovascular complications.  Based on clinical review performed today (03/21/21), barring any significant acute changes in the patient's overall condition, it is anticipated that he will be able to proceed with the planned surgical intervention. Any acute changes in clinical condition may necessitate his procedure being postponed and/or cancelled. Patient will meet with anesthesia team (MD and/or CRNA) on the day of his procedure for preoperative evaluation/assessment. Questions regarding anesthetic course will be fielded at that time.   Pre-surgical instructions were reviewed with the patient during his PAT appointment and questions were fielded by PAT clinical staff. Patient was advised that if any questions or concerns arise prior to his procedure then he should return a call to PAT and/or his surgeon's office to discuss.  Quentin Mulling, MSN, APRN, FNP-C, CEN [facility]  Peri-operative Services Nurse Practitioner Phone: [phone] Fax: [phone] 03/21/21 10:39 AM  NOTE: This note has been prepared using Dragon dictation software. Despite my best ability to proofread, there is always the potential that unintentional transcriptional errors may still occur from this process.

## 2021-03-23 MED ORDER — PROPOFOL 10 MG/ML IV BOLUS
INTRAVENOUS | Status: AC
  Filled 2021-03-23: qty 20

## 2021-03-23 MED ORDER — LIDOCAINE HCL (PF) 2 % IJ SOLN
INTRAMUSCULAR | Status: AC
  Filled 2021-03-23: qty 5

## 2021-03-24 MED ORDER — PROPOFOL 500 MG/50ML IV EMUL
INTRAVENOUS | Status: AC
  Filled 2021-03-24: qty 50

## 2021-03-24 MED ORDER — PROPOFOL 10 MG/ML IV BOLUS
INTRAVENOUS | Status: AC
  Filled 2021-03-24: qty 20

## 2021-03-25 ENCOUNTER — Other Ambulatory Visit: Payer: Self-pay

## 2021-03-25 ENCOUNTER — Ambulatory Visit: Payer: Medicare Other | Admitting: Urgent Care

## 2021-03-25 ENCOUNTER — Ambulatory Visit
Admission: RE | Admit: 2021-03-25 | Discharge: 2021-03-25 | Disposition: A | Discharge status: Home / Self Care | Payer: Medicare Other | Source: Ambulatory Visit | Attending: Urology | Admitting: Urology

## 2021-03-25 ENCOUNTER — Encounter: Payer: Self-pay | Admitting: Urology

## 2021-03-25 ENCOUNTER — Encounter
Admission: RE | Disposition: A | Discharge status: Home / Self Care | Payer: Self-pay | Source: Ambulatory Visit | Attending: Urology

## 2021-03-25 DIAGNOSIS — R338 Other retention of urine: Secondary | ICD-10-CM | POA: Insufficient documentation

## 2021-03-25 DIAGNOSIS — I2510 Atherosclerotic heart disease of native coronary artery without angina pectoris: Secondary | ICD-10-CM | POA: Insufficient documentation

## 2021-03-25 DIAGNOSIS — Z79899 Other long term (current) drug therapy: Secondary | ICD-10-CM | POA: Insufficient documentation

## 2021-03-25 DIAGNOSIS — I10 Essential (primary) hypertension: Secondary | ICD-10-CM | POA: Diagnosis not present

## 2021-03-25 DIAGNOSIS — R339 Retention of urine, unspecified: Secondary | ICD-10-CM | POA: Diagnosis not present

## 2021-03-25 DIAGNOSIS — I252 Old myocardial infarction: Secondary | ICD-10-CM | POA: Diagnosis not present

## 2021-03-25 DIAGNOSIS — K219 Gastro-esophageal reflux disease without esophagitis: Secondary | ICD-10-CM | POA: Insufficient documentation

## 2021-03-25 DIAGNOSIS — E785 Hyperlipidemia, unspecified: Secondary | ICD-10-CM | POA: Diagnosis not present

## 2021-03-25 DIAGNOSIS — Z955 Presence of coronary angioplasty implant and graft: Secondary | ICD-10-CM | POA: Diagnosis not present

## 2021-03-25 DIAGNOSIS — N401 Enlarged prostate with lower urinary tract symptoms: Secondary | ICD-10-CM | POA: Diagnosis not present

## 2021-03-25 DIAGNOSIS — N138 Other obstructive and reflux uropathy: Secondary | ICD-10-CM

## 2021-03-25 HISTORY — DX: Thoracic aortic ectasia: I77.810

## 2021-03-25 HISTORY — DX: Left anterior fascicular block: I44.4

## 2021-03-25 HISTORY — PX: HOLEP-LASER ENUCLEATION OF THE PROSTATE WITH MORCELLATION: SHX6641

## 2021-03-25 LAB — POCT I-STAT, CHEM 8
BUN: 19 mg/dL (ref 8–23)
Calcium, Ion: 1.2 mmol/L (ref 1.15–1.40)
Chloride: 107 mmol/L (ref 98–111)
Creatinine, Ser: 1.1 mg/dL (ref 0.61–1.24)
Glucose, Bld: 100 mg/dL — ABNORMAL HIGH (ref 70–99)
HCT: 46 % (ref 39.0–52.0)
Hemoglobin: 15.6 g/dL (ref 13.0–17.0)
Potassium: 4 mmol/L (ref 3.5–5.1)
Sodium: 142 mmol/L (ref 135–145)
TCO2: 24 mmol/L (ref 22–32)

## 2021-03-25 SURGERY — ENUCLEATION, PROSTATE, USING LASER, WITH MORCELLATION
Anesthesia: General

## 2021-03-25 MED ORDER — MEPERIDINE HCL 25 MG/ML IJ SOLN: 6.2500 mg | INTRAMUSCULAR | Status: DC | PRN

## 2021-03-25 MED ORDER — PROPOFOL 500 MG/50ML IV EMUL
INTRAVENOUS | Status: AC
  Filled 2021-03-25: qty 100

## 2021-03-25 MED ORDER — ONDANSETRON HCL 4 MG/2ML IJ SOLN
INTRAMUSCULAR | Status: AC
  Filled 2021-03-25: qty 2

## 2021-03-25 MED ORDER — MIDAZOLAM HCL 2 MG/2ML IJ SOLN
INTRAMUSCULAR | Status: AC
  Filled 2021-03-25: qty 2

## 2021-03-25 MED ORDER — STERILE WATER FOR IRRIGATION IR SOLN
Status: DC | PRN
  Administered 2021-03-25: 1000 mL

## 2021-03-25 MED ORDER — BELLADONNA ALKALOIDS-OPIUM 16.2-60 MG RE SUPP
RECTAL | Status: AC
  Filled 2021-03-25: qty 1

## 2021-03-25 MED ORDER — PROPOFOL 10 MG/ML IV BOLUS
INTRAVENOUS | Status: AC
  Filled 2021-03-25: qty 20

## 2021-03-25 MED ORDER — PROPOFOL 500 MG/50ML IV EMUL
INTRAVENOUS | Status: AC
  Filled 2021-03-25: qty 50

## 2021-03-25 MED ORDER — PROPOFOL 500 MG/50ML IV EMUL
INTRAVENOUS | Status: AC
  Filled 2021-03-25: qty 150

## 2021-03-25 MED ORDER — SODIUM CHLORIDE 0.9 % IV SOLN
1.0000 g | INTRAVENOUS | Status: AC
  Administered 2021-03-25: 1 g via INTRAVENOUS
  Filled 2021-03-25: qty 1

## 2021-03-25 MED ORDER — PHENYLEPHRINE HCL (PRESSORS) 10 MG/ML IV SOLN
INTRAVENOUS | Status: AC
  Filled 2021-03-25: qty 2

## 2021-03-25 MED ORDER — LACTATED RINGERS IV SOLN: INTRAVENOUS | Status: DC

## 2021-03-25 MED ORDER — DEXAMETHASONE SODIUM PHOSPHATE 10 MG/ML IJ SOLN
INTRAMUSCULAR | Status: DC | PRN
  Administered 2021-03-25: 10 mg via INTRAVENOUS

## 2021-03-25 MED ORDER — DEXAMETHASONE SODIUM PHOSPHATE 10 MG/ML IJ SOLN
INTRAMUSCULAR | Status: AC
  Filled 2021-03-25: qty 1

## 2021-03-25 MED ORDER — MIDAZOLAM HCL 2 MG/2ML IJ SOLN
INTRAMUSCULAR | Status: DC | PRN
  Administered 2021-03-25: 2 mg via INTRAVENOUS

## 2021-03-25 MED ORDER — FENTANYL CITRATE (PF) 100 MCG/2ML IJ SOLN
INTRAMUSCULAR | Status: DC | PRN
  Administered 2021-03-25 (×2): 25 ug via INTRAVENOUS
  Administered 2021-03-25: 50 ug via INTRAVENOUS

## 2021-03-25 MED ORDER — HYDROCODONE-ACETAMINOPHEN 5-325 MG PO TABS
ORAL_TABLET | ORAL | Status: AC
  Administered 2021-03-25: 1 via ORAL
  Filled 2021-03-25: qty 1

## 2021-03-25 MED ORDER — SODIUM CHLORIDE 0.9 % IR SOLN
Status: DC | PRN
  Administered 2021-03-25: 51000 mL

## 2021-03-25 MED ORDER — ROCURONIUM BROMIDE 100 MG/10ML IV SOLN
INTRAVENOUS | Status: DC | PRN
  Administered 2021-03-25: 50 mg via INTRAVENOUS

## 2021-03-25 MED ORDER — CHLORHEXIDINE GLUCONATE 0.12 % MT SOLN
OROMUCOSAL | Status: AC
  Administered 2021-03-25: 15 mL via OROMUCOSAL
  Filled 2021-03-25: qty 15

## 2021-03-25 MED ORDER — PHENYLEPHRINE HCL-NACL 20-0.9 MG/250ML-% IV SOLN
INTRAVENOUS | Status: DC | PRN
  Administered 2021-03-25: 20 ug/min via INTRAVENOUS

## 2021-03-25 MED ORDER — LIDOCAINE HCL (PF) 2 % IJ SOLN
INTRAMUSCULAR | Status: AC
  Filled 2021-03-25: qty 5

## 2021-03-25 MED ORDER — SEVOFLURANE IN SOLN
RESPIRATORY_TRACT | Status: AC
  Filled 2021-03-25: qty 250

## 2021-03-25 MED ORDER — SUGAMMADEX SODIUM 200 MG/2ML IV SOLN
INTRAVENOUS | Status: DC | PRN
  Administered 2021-03-25 (×2): 200 mg via INTRAVENOUS

## 2021-03-25 MED ORDER — EPHEDRINE SULFATE 50 MG/ML IJ SOLN
INTRAMUSCULAR | Status: DC | PRN
  Administered 2021-03-25 (×2): 10 mg via INTRAVENOUS

## 2021-03-25 MED ORDER — SODIUM CHLORIDE (PF) 0.9 % IJ SOLN
INTRAMUSCULAR | Status: AC
  Filled 2021-03-25: qty 10

## 2021-03-25 MED ORDER — BELLADONNA ALKALOIDS-OPIUM 16.2-60 MG RE SUPP
RECTAL | Status: DC | PRN
  Administered 2021-03-25: 1 via RECTAL

## 2021-03-25 MED ORDER — GLYCOPYRROLATE 0.2 MG/ML IJ SOLN
INTRAMUSCULAR | Status: AC
  Filled 2021-03-25: qty 1

## 2021-03-25 MED ORDER — FENTANYL CITRATE (PF) 100 MCG/2ML IJ SOLN
INTRAMUSCULAR | Status: AC
  Administered 2021-03-25: 25 ug via INTRAVENOUS
  Filled 2021-03-25: qty 2

## 2021-03-25 MED ORDER — DEXMEDETOMIDINE HCL IN NACL 400 MCG/100ML IV SOLN
INTRAVENOUS | Status: DC | PRN
  Administered 2021-03-25: 8 ug via INTRAVENOUS

## 2021-03-25 MED ORDER — PROPOFOL 10 MG/ML IV BOLUS
INTRAVENOUS | Status: DC | PRN
  Administered 2021-03-25: 250 mg via INTRAVENOUS

## 2021-03-25 MED ORDER — HYDROCODONE-ACETAMINOPHEN 5-325 MG PO TABS: 1.0000 | ORAL_TABLET | Freq: Four times a day (QID) | ORAL | 0 refills | Status: AC | PRN

## 2021-03-25 MED ORDER — CIPROFLOXACIN IN D5W 400 MG/200ML IV SOLN
400.0000 mg | INTRAVENOUS | Status: AC
  Administered 2021-03-25: 400 mg via INTRAVENOUS

## 2021-03-25 MED ORDER — EPHEDRINE 5 MG/ML INJ
INTRAVENOUS | Status: AC
  Filled 2021-03-25: qty 5

## 2021-03-25 MED ORDER — CHLORHEXIDINE GLUCONATE 0.12 % MT SOLN: 15.0000 mL | Freq: Once | OROMUCOSAL | Status: AC

## 2021-03-25 MED ORDER — HYDROCODONE-ACETAMINOPHEN 5-325 MG PO TABS: 1.0000 | ORAL_TABLET | Freq: Once | ORAL | Status: AC

## 2021-03-25 MED ORDER — CIPROFLOXACIN IN D5W 400 MG/200ML IV SOLN
INTRAVENOUS | Status: AC
  Filled 2021-03-25: qty 200

## 2021-03-25 MED ORDER — ESMOLOL HCL 100 MG/10ML IV SOLN
INTRAVENOUS | Status: AC
  Filled 2021-03-25: qty 10

## 2021-03-25 MED ORDER — ONDANSETRON HCL 4 MG/2ML IJ SOLN
INTRAMUSCULAR | Status: DC | PRN
  Administered 2021-03-25: 4 mg via INTRAVENOUS

## 2021-03-25 MED ORDER — FENTANYL CITRATE (PF) 100 MCG/2ML IJ SOLN
INTRAMUSCULAR | Status: AC
  Filled 2021-03-25: qty 2

## 2021-03-25 MED ORDER — SODIUM CHLORIDE 0.9 % IV SOLN
INTRAVENOUS | Status: DC | PRN
  Administered 2021-03-25 (×3): 80 ug via INTRAVENOUS

## 2021-03-25 MED ORDER — LIDOCAINE HCL (CARDIAC) PF 100 MG/5ML IV SOSY
PREFILLED_SYRINGE | INTRAVENOUS | Status: DC | PRN
  Administered 2021-03-25: 100 mg via INTRAVENOUS

## 2021-03-25 MED ORDER — FENTANYL CITRATE (PF) 100 MCG/2ML IJ SOLN
25.0000 ug | INTRAMUSCULAR | Status: DC | PRN
  Administered 2021-03-25: 25 ug via INTRAVENOUS

## 2021-03-25 MED ORDER — ORAL CARE MOUTH RINSE: 15.0000 mL | Freq: Once | OROMUCOSAL | Status: AC

## 2021-03-25 MED ORDER — ONDANSETRON HCL 4 MG/2ML IJ SOLN: 4.0000 mg | Freq: Once | INTRAMUSCULAR | Status: DC | PRN

## 2021-03-25 SURGICAL SUPPLY — 38 items
ADAPTER IRRIG TUBE 2 SPIKE SOL (ADAPTER) ×4 IMPLANT
ADPR TBG 2 SPK PMP STRL ASCP (ADAPTER) ×2
BAG DRN LRG CPC RND TRDRP CNTR (MISCELLANEOUS) ×1
BAG URO DRAIN 4000ML (MISCELLANEOUS) ×2 IMPLANT
CATH FOLEY 3WAY 30CC 24FR (CATHETERS) ×2
CATH URETL OPEN 5X70 (CATHETERS) ×2 IMPLANT
CATH URTH STD 24FR FL 3W 2 (CATHETERS) ×1 IMPLANT
CONTAINER COLLECT MORCELLATR (MISCELLANEOUS) ×1 IMPLANT
DRAPE UTILITY 15X26 TOWEL STRL (DRAPES) IMPLANT
ELECT BIVAP BIPO 22/24 DONUT (ELECTROSURGICAL)
ELECTRD BIVAP BIPO 22/24 DONUT (ELECTROSURGICAL) IMPLANT
FIBER LASER MOSES 550 DFL (Laser) ×1 IMPLANT
FILTER OVERFLOW MORCELLATOR (FILTER) ×1 IMPLANT
GAUZE 4X4 16PLY ~~LOC~~+RFID DBL (SPONGE) ×4 IMPLANT
GLOVE SURG UNDER POLY LF SZ7.5 (GLOVE) ×2 IMPLANT
GOWN STRL REUS W/ TWL LRG LVL3 (GOWN DISPOSABLE) ×1 IMPLANT
GOWN STRL REUS W/ TWL XL LVL3 (GOWN DISPOSABLE) ×1 IMPLANT
GOWN STRL REUS W/TWL LRG LVL3 (GOWN DISPOSABLE) ×2
GOWN STRL REUS W/TWL XL LVL3 (GOWN DISPOSABLE) ×2
HOLDER FOLEY CATH W/STRAP (MISCELLANEOUS) ×2 IMPLANT
IV NS IRRIG 3000ML ARTHROMATIC (IV SOLUTION) ×22 IMPLANT
KIT TURNOVER CYSTO (KITS) ×2 IMPLANT
MANIFOLD NEPTUNE II (INSTRUMENTS) IMPLANT
MBRN O SEALING YLW 17 FOR INST (MISCELLANEOUS) ×2
MEMBRANE SLNG YLW 17 FOR INST (MISCELLANEOUS) ×1 IMPLANT
MORCELLATOR COLLECT CONTAINER (MISCELLANEOUS) ×2
MORCELLATOR OVERFLOW FILTER (FILTER) ×2
MORCELLATOR ROTATION 4.75 335 (MISCELLANEOUS) ×2 IMPLANT
PACK CYSTO AR (MISCELLANEOUS) ×2 IMPLANT
SET CYSTO W/LG BORE CLAMP LF (SET/KITS/TRAYS/PACK) ×2 IMPLANT
SET IRRIG Y TYPE TUR BLADDER L (SET/KITS/TRAYS/PACK) ×2 IMPLANT
SLEEVE PROTECTION STRL DISP (MISCELLANEOUS) ×4 IMPLANT
SURGILUBE 2OZ TUBE FLIPTOP (MISCELLANEOUS) ×2 IMPLANT
SYR TOOMEY IRRIG 70ML (MISCELLANEOUS) ×2
SYRINGE TOOMEY IRRIG 70ML (MISCELLANEOUS) ×1 IMPLANT
TUBE PUMP MORCELLATOR PIRANHA (TUBING) ×2 IMPLANT
WATER STERILE IRR 1000ML POUR (IV SOLUTION) ×2 IMPLANT
WATER STERILE IRR 500ML POUR (IV SOLUTION) ×2 IMPLANT

## 2021-03-25 NOTE — Discharge Instructions (Addendum)

## 2021-03-25 NOTE — Op Note (Signed)
Date of procedure: 03/25/21  Preoperative diagnosis:  BPH and urinary retention  Postoperative diagnosis:  Same  Procedure: HoLEP (Holmium Laser Enucleation of the Prostate)  Surgeon: Legrand Rams, MD  Anesthesia: General  Complications: None  Intraoperative findings:  Large prostate with median and lateral lobe hypertrophy, mild to moderate bladder trabeculations, mild erythema posterior wall from indwelling Foley, no tumors, ureteral orifices orthotopic Uncomplicated HOLEP, ureteral orifices and verumontanum intact at conclusion of case  EBL: Minimal  Specimens: Prostate chips  Enucleation time: 44 minutes  Morcellation time: 17 minutes  Intra-op weight: 95 g  Drains: 24 French three-way, 60 cc in balloon  Indication: Jeff Warren is a 65 y.o. patient with BPH and Foley dependent urinary retention.  After reviewing the management options for treatment, they elected to proceed with the above surgical procedure(s). We have discussed the potential benefits and risks of the procedure, side effects of the proposed treatment, the likelihood of the patient achieving the goals of the procedure, and any potential problems that might occur during the procedure or recuperation.  We specifically discussed the risks of bleeding, infection, hematuria and clot retention, need for additional procedures, possible overnight hospital stay, temporary urgency and incontinence, rare long-term incontinence, and retrograde ejaculation.  Informed consent has been obtained.   Description of procedure:  The patient was taken to the operating room and general anesthesia was induced.  The patient was placed in the dorsal lithotomy position, prepped and draped in the usual sterile fashion, and preoperative antibiotics(ampicillin and Cipro) were administered.  SCDs were placed for DVT prophylaxis.  A preoperative time-out was performed.   [name] sounds were used to gently dilated the urethra up to 54F.  The 9 French continuous flow resectoscope was inserted into the urethra using the visual obturator  The prostate was large with obstructing lateral lobes and median lobe and high bladder neck. The bladder was thoroughly inspected and notable for moderate trabeculations, mild erythema at the posterior wall from the indwelling Foley, but no bladder tumors.  The ureteral orifices were located in orthotopic position.  The laser was set to 2 J and 60 Hz and was used to make a lambda incision just proximal to the verumontanum down to the level of the capsule.  A 5 and 7 o'clock incision were then made down to the level of the capsule from the bladder neck to the verumontanum, and the median lobe was enucleated into the bladder.  The lateral lobes were then incised circumferentially until they were disconnected from the surrounding tissue.  The capsule was examined and laser was used for meticulous hemostasis.    The 17 French resectoscope was then switched out for the 26 French nephroscope and the lobes were morcellated and the tissue sent to pathology.  A 24 French three-way catheter was inserted easily, and  60cc were placed in the balloon.  Urine was light pink.  The catheter irrigated easily with a Toomey syringe.  CBI was initiated. A belladonna suppository was placed.  The patient tolerated the procedure well without any immediate complications and was extubated and transferred to the recovery room in stable condition.  Urine was clear on fast CBI.  Disposition: Stable to PACU  Plan: Wean CBI in PACU, anticipate discharge home today with Foley removal in clinic in 2-3 days MD follow-up in 10 to 12 weeks with PVR  Legrand Rams, MD 03/25/2021

## 2021-03-25 NOTE — Anesthesia Postprocedure Evaluation (Signed)
Anesthesia Post Note  Patient: Jeff Warren  Procedure(s) Performed: HOLEP-LASER ENUCLEATION OF THE PROSTATE WITH MORCELLATION  Patient location during evaluation: PACU Anesthesia Type: General Level of consciousness: awake and alert, awake and oriented Pain management: pain level controlled Vital Signs Assessment: post-procedure vital signs reviewed and stable Respiratory status: spontaneous breathing, nonlabored ventilation and respiratory function stable Cardiovascular status: blood pressure returned to baseline and stable Postop Assessment: no apparent nausea or vomiting Anesthetic complications: no   No notable events documented.   Last Vitals:  Vitals:   03/25/21 1010 03/25/21 1033  BP: 132/73 104/88  Pulse: 61 60  Resp: 18 18  Temp: (!) 36.3 C   SpO2: 96% 96%    Last Pain:  Vitals:   03/25/21 1010  TempSrc: Temporal  PainSc: 4                  Manfred Arch

## 2021-03-25 NOTE — Transfer of Care (Signed)
Immediate Anesthesia Transfer of Care Note  Patient: Jeff Warren  Procedure(s) Performed: HOLEP-LASER ENUCLEATION OF THE PROSTATE WITH MORCELLATION  Patient Location: PACU  Anesthesia Type:General  Level of Consciousness: awake  Airway & Oxygen Therapy: Patient connected to nasal cannula oxygen  Post-op Assessment: Report given to RN  Post vital signs: stable  Last Vitals:  Vitals Value Taken Time  BP 119/89 03/25/21 0918  Temp    Pulse 57 03/25/21 0920  Resp 14 03/25/21 0920  SpO2 93 % 03/25/21 0920  Vitals shown include unvalidated device data.  Last Pain:  Vitals:   03/25/21 0631  TempSrc: Temporal  PainSc: 0-No pain         Complications: No notable events documented.

## 2021-03-25 NOTE — Anesthesia Preprocedure Evaluation (Addendum)
Anesthesia Evaluation  Patient identified by MRN, date of birth, ID band Patient awake    Reviewed: Allergy & Precautions, NPO status , Patient's Chart, lab work & pertinent test results, reviewed documented beta blocker date and time   Airway Mallampati: III  TM Distance: >3 FB Neck ROM: Full    Dental  (+) Dental Advisory Given, Missing   Pulmonary shortness of breath and with exertion, sleep apnea ,    Pulmonary exam normal breath sounds clear to auscultation       Cardiovascular hypertension, Pt. on home beta blockers and Pt. on medications + CAD, + Past MI and + Cardiac Stents  Normal cardiovascular exam+ dysrhythmias ([place])  Rhythm:Regular Rate:Normal     Neuro/Psych negative neurological ROS  negative psych ROS   GI/Hepatic Neg liver ROS, GERD  Medicated and Controlled,  Endo/Other  Hypothyroidism Obesity   Renal/GU negative Renal ROS     Musculoskeletal negative musculoskeletal ROS (+)   Abdominal   Peds  Hematology negative hematology ROS (+)   Anesthesia Other Findings BPH (benign prostatic hyperplasia)  CAD (coronary artery disease)  a. inferior ST elevation MI 12/05/16: cardiac cath LM nl, p-mLAD 80%, D1 CTO with right-to-left collats, LCx minor irregs, m-dRCA 30%, dRCA 100% s/p PCI/DES, EF 45-50%, severely elevated LVEDP; b. staged PCI 12/23/16 - successful PCI/DES to p-mLAD with small diag being jailed by stent (vessel known to be subtotally occluded with R-L collats)  Dyspnea    Family history of premature CAD  a. sister passed from MI at age 68  GERD (gastroesophageal reflux disease) Gout    HLD (hyperlipidemia)    Hypertension    Myocardial infarction (HCC) 2018  Systolic dysfunction  a. TTE 12/05/16: EF 50-55%, inf HK, GR1DD, mildly dilated LA  Urinary retention       Reproductive/Obstetrics                             Anesthesia Physical  Anesthesia Plan  ASA:  3 and emergent  Anesthesia Plan: General   Post-op Pain Management:    Induction: Intravenous  PONV Risk Score and Plan: 2 and Dexamethasone and Ondansetron  Airway Management Planned: Oral ETT  Additional Equipment:   Intra-op Plan:   Post-operative Plan: Extubation in OR  Informed Consent: I have reviewed the patients History and Physical, chart, labs and discussed the procedure including the risks, benefits and alternatives for the proposed anesthesia with the patient or authorized representative who has indicated his/her understanding and acceptance.     Dental advisory given  Plan Discussed with: CRNA, Anesthesiologist and Surgeon  Anesthesia Plan Comments:        Anesthesia Quick Evaluation

## 2021-03-25 NOTE — Anesthesia Procedure Notes (Signed)
Procedure Name: Intubation Date/Time: 03/18/2021 7:41 AM Performed by: Carter Kitten, CRNA Pre-anesthesia Checklist: Patient identified, Emergency Drugs available and Suction available Patient Re-evaluated:Patient Re-evaluated prior to induction Oxygen Delivery Method: Circle system utilized Preoxygenation: Pre-oxygenation with 100% oxygen Induction Type: IV induction Laryngoscope Size: 3 and McGraph Grade View: Grade I Tube type: Oral Tube size: 7.0 mm Number of attempts: 1 Airway Equipment and Method: Stylet and Patient positioned with wedge pillow Placement Confirmation: ETT inserted through vocal cords under direct vision, positive ETCO2 and breath sounds checked- equal and bilateral Secured at: 23 cm Tube secured with: Tape Dental Injury: Teeth and Oropharynx as per pre-operative assessment

## 2021-03-25 NOTE — Interval H&P Note (Signed)
UROLOGY H&P UPDATE  Agree with prior H&P dated 03/12/21.  Cardiac: RRR Lungs: CTA bilaterally  Laterality: N/.A Procedure: HoLEP  Urine: culture 10/5 with enterococcus and citrobacter, treated with appropriate antibiotics  We discussed the risks and benefits of HoLEP at length.  The procedure requires general anesthesia and takes 1 to 2 hours, and a holmium laser is used to enucleate the prostate and push this tissue into the bladder.  A morcellator is then used to remove this tissue, which is sent for pathology.  The vast majority(>95%) of patients are able to discharge the same day with a catheter in place for 2 to 3 days, and will follow-up in clinic for a voiding trial.  We specifically discussed the risks of bleeding, infection, retrograde ejaculation, temporary urgency and urge incontinence, very low risk of long-term incontinence, urethral stricture/bladder neck contracture, pathologic evaluation of prostate tissue and possible detection of prostate cancer or other malignancy, and possible need for additional procedures.   Sondra Come, MD 03/25/2021

## 2021-03-26 NOTE — Progress Notes (Signed)
03/27/2021 6:37 PM   Jeff Warren 01-Jan-1956 [id number]  Referring provider: [name], MD [address] [place],  [place] [address]  Chief Complaint  Patient presents with   Foley removal    Urological history: 1. BPH with LU TS -179 gram prostate on TRUS -s/p HoLEP 03/25/2021  2. Urinary retention -post-op retention after eye surgery  3. Elevated PSA  PSA 19 -prostate biopsy with Dr. Alvester Morin -negative -prostate chips pathology pending   HPI: Jeff Warren is a 65 y.o. male who presents today for Foley catheter removal.    He has not had any difficulty with his catheter at this time.    PMH: Past Medical History:  Diagnosis Date   Aortic root dilation (HCC)    a.) TTE 02/26/2021: mild; measured 40 mm.   BPH (benign prostatic hyperplasia)    CAD (coronary artery disease)    a. inferior ST elevation MI 12/05/16: cardiac cath LM nl, p-mLAD 80%, D1 CTO with right-to-left collats, LCx minor irregs,  m-dRCA 30%, dRCA 100% s/p PCI/DES, EF 45-50%, severely elevated LVEDP; b. staged PCI 12/23/16 - successful PCI/DES to p-mLAD with small diag being jailed by stent (vessel known to be subtotally occluded with R-L collats)   Dyspnea    Family history of premature CAD    a. sister passed from MI at age 42   GERD (gastroesophageal reflux disease)    Gout    HLD (hyperlipidemia)    Hypertension    LAFB (left anterior fascicular block)    ST elevation myocardial infarction (STEMI) of inferior wall (HCC) 12/05/2016   a.) LHC --> EF 45% with severely elevated LVEDP; 2v CAD; culprit was thrombotic occlusion of dRCA; D1 occluded with R-L collaterals; 80% p-mLAD. PCI performed placing a 3.5 x 15 mm Resolute Onyx DES x 1 to dRCA. Plans for staged PCI of LAD.   Systolic dysfunction    a.) TTE 12/05/16: EF 50-55%, inf HK, GR1DD, mildly dilated LA. b.) TTE 02/26/21: EF 45-50%, inferolateral HK, mild MR, G2DD.   Urinary retention     Surgical History: Past Surgical History:   Procedure Laterality Date   ABDOMINOPLASTY     COLONOSCOPY WITH PROPOFOL N/A 06/29/2016   Procedure: COLONOSCOPY WITH PROPOFOL;  Surgeon: Charolett Bumpers, MD;  Location: [facility]OS[facility];  Service: Endoscopy;  Laterality: N/A;   CORONARY STENT INTERVENTION N/A 12/23/2016   Procedure: Coronary Stent Intervention;  Surgeon: Iran Ouch, MD;  Location: [facility]VASIVE CV LAB;  Service: Cardiovascular;  Laterality: N/A;   CORONARY/GRAFT ACUTE MI REVASCULARIZATION N/A 12/05/2016   Procedure: Coronary/Graft Acute MI Revascularization;  Surgeon: Iran Ouch, MD;  Location: [facility] INVASIVE CV LAB;  Service: Cardiovascular;  Laterality: N/A;   ganglion cyst removal Right    GAS INSERTION Left 11/21/2020   Procedure: INSERTION OF GAS LEFT EYE (C3F8);  Surgeon: Stephannie Li, MD;  Location: [facility] OR;  Service: Ophthalmology;  Laterality: Left;   GAS/FLUID EXCHANGE Left 11/21/2020   Procedure: GAS/FLUID EXCHANGE;  Surgeon: Stephannie Li, MD;  Location: [facility] OR;  Service: Ophthalmology;  Laterality: Left;   HOLEP-LASER ENUCLEATION OF THE PROSTATE WITH MORCELLATION N/A 03/25/2021   Procedure: HOLEP-LASER ENUCLEATION OF THE PROSTATE WITH MORCELLATION;  Surgeon: Sondra Come, MD;  Location: [facility] ORS;  Service: Urology;  Laterality: N/A;   LEFT HEART CATH AND CORONARY ANGIOGRAPHY N/A 12/05/2016   Procedure: Left Heart Cath and Coronary Angiography;  Surgeon: Iran Ouch, MD;  Location: [facility] INVASIVE CV LAB;  Service: Cardiovascular;  Laterality: N/A;   NO PAST SURGERIES     Has has some cosmetic surgeries   VITRECTOMY 25 GAUGE WITH SCLERAL BUCKLE Left 11/21/2020   Procedure: VITRECTOMY 25 GAUGE WITH SCLERAL BUCKLE;  Surgeon: Stephannie Li, MD;  Location: [facility] OR;  Service: Ophthalmology;  Laterality: Left;    Home Medications:  Allergies as of 03/27/2021       Reactions   Bextra [valdecoxib] Swelling        Medication List        Accurate as of March 27, 2021 11:59 PM. If you have any  questions, ask your nurse or doctor.          acetaminophen 500 MG tablet Commonly known as: TYLENOL Take 500-1,000 mg by mouth every 6 (six) hours as needed for moderate pain.   aspirin EC 81 MG tablet Take 81 mg by mouth daily. Swallow whole.   carvedilol 6.25 MG tablet Commonly known as: COREG Take 6.25 mg by mouth 2 (two) times daily.   Coenzyme Q10 300 MG Caps Take 300 mg by mouth daily.   Dialyvite Vitamin D 5000 125 MCG (5000 UT) capsule Generic drug: Cholecalciferol Take 5,000 Units by mouth daily.   ezetimibe 10 MG tablet Commonly known as: ZETIA Take 1 tablet (10 mg total) by mouth daily. What changed: when to take this   fluticasone 50 MCG/ACT nasal spray Commonly known as: FLONASE Place 1 spray into both nostrils at bedtime.   furosemide 20 MG tablet Commonly known as: LASIX Take 20 mg by mouth daily as needed for edema.   GENTEAL OP Place 1 drop into both eyes daily as needed (dry eyes).   HYDROcodone-acetaminophen 5-325 MG tablet Commonly known as: NORCO/VICODIN Take 1 tablet by mouth every 6 (six) hours as needed for up to 3 days for moderate pain.   indomethacin 50 MG capsule Commonly known as: INDOCIN Take 50 mg by mouth 2 (two) times daily as needed (gout flares).   losartan 100 MG tablet Commonly known as: COZAAR Take 100 mg by mouth in the morning.   nitrofurantoin (macrocrystal-monohydrate) 100 MG capsule Commonly known as: MACROBID Take 1 capsule (100 mg total) by mouth every 12 (twelve) hours.   NITROGLYCERIN RE Place 1 application rectally daily as needed (rectal bleeding). 0.2%   omeprazole 20 MG tablet Commonly known as: PRILOSEC OTC Take 20 mg by mouth every other day.   Potassium 99 MG Tabs Take 99 mg by mouth daily as needed (cramps).   rosuvastatin 40 MG tablet Commonly known as: CRESTOR Take 1 tablet (40 mg total) by mouth daily. Please call to schedule appointment for further refills. Thank you!         Allergies:  Allergies  Allergen Reactions   Bextra [Valdecoxib] Swelling    Family History: Family History  Problem Relation Age of Onset   Diabetes Mellitus II Mother    Cancer Father    Diabetes Mellitus II Sister    Diabetes Mellitus II Brother    Kidney failure Brother     Social History:  reports that he has never smoked. He has never used smokeless tobacco. He reports that he does not drink alcohol and does not use drugs.  ROS: Pertinent ROS in HPI  Physical Exam: Constitutional:  Well nourished. Alert and oriented, No acute distress. HEENT: [facility] AT, mask in place.  Trachea midline Cardiovascular: No clubbing, cyanosis, or edema. Respiratory: Normal respiratory effort, no increased work of breathing. GU: No CVA tenderness.  No bladder fullness or masses.  Patient with circumcised phallus.  Urethral meatus is patent.  Foley in place.  Leg bag with dark pink urine, no clots. Scrotum without lesions, cysts, rashes and/or edema.   Neurologic: Grossly intact, no focal deficits, moving all 4 extremities. Psychiatric: Normal mood and affect.  Laboratory Data: Lab Results  Component Value Date   WBC 6.1 12/29/2020   HGB 15.6 03/25/2021   HCT 46.0 03/25/2021   MCV 93.6 12/29/2020   PLT 153 12/29/2020   Lab Results  Component Value Date   CREATININE 1.10 03/25/2021      Component Value Date/Time   CHOL 152 02/06/2021 0928   HDL 34 (L) 02/06/2021 0928   CHOLHDL 4.5 02/06/2021 0928   CHOLHDL 6.0 12/06/2016 0546   VLDL 34 12/06/2016 0546   LDLCALC 90 02/06/2021 0928    Lab Results  Component Value Date   AST 22 02/06/2021   Lab Results  Component Value Date   ALT 18 02/06/2021    Urinalysis    Component Value Date/Time   COLORURINE RED (A) 12/29/2020 1228   APPEARANCEUR Cloudy (A) 03/12/2021 1420   LABSPEC  12/29/2020 1228    TEST NOT REPORTED DUE TO COLOR INTERFERENCE OF URINE PIGMENT   PHURINE  12/29/2020 1228    TEST NOT REPORTED DUE TO COLOR  INTERFERENCE OF URINE PIGMENT   GLUCOSEU Negative 03/12/2021 1420   HGBUR (A) 12/29/2020 1228    TEST NOT REPORTED DUE TO COLOR INTERFERENCE OF URINE PIGMENT   BILIRUBINUR Negative 03/12/2021 1420   KETONESUR (A) 12/29/2020 1228    TEST NOT REPORTED DUE TO COLOR INTERFERENCE OF URINE PIGMENT   PROTEINUR 3+ (A) 03/12/2021 1420   PROTEINUR (A) 12/29/2020 1228    TEST NOT REPORTED DUE TO COLOR INTERFERENCE OF URINE PIGMENT   NITRITE Negative 03/12/2021 1420   NITRITE (A) 12/29/2020 1228    TEST NOT REPORTED DUE TO COLOR INTERFERENCE OF URINE PIGMENT   LEUKOCYTESUR 1+ (A) 03/12/2021 1420   LEUKOCYTESUR (A) 12/29/2020 1228    TEST NOT REPORTED DUE TO COLOR INTERFERENCE OF URINE PIGMENT  I have reviewed the labs.   Pertinent Imaging: N/A  Catheter Removal Patient is present today for a catheter removal.  60 ml of water was drained from the balloon. A 24 FR 3-way foley cath was removed from the bladder no complications were noted . Patient tolerated well.  Assessment & Plan:   1. BPH with LU TS -s/p HoLEP 03/25/2021 -Foley catheter removed today without difficulty    Return for 05/22/2021 wtih Dr. Richardo Hanks .  These notes generated with voice recognition software. I apologize for typographical errors.  Michiel Cowboy, PA-C  [facility] [address]  [address] [place], [place] [address] [phone]

## 2021-03-27 ENCOUNTER — Ambulatory Visit (INDEPENDENT_AMBULATORY_CARE_PROVIDER_SITE_OTHER): Payer: Medicare Other | Admitting: Urology

## 2021-03-27 ENCOUNTER — Ambulatory Visit: Payer: Medicare Other | Admitting: Urology

## 2021-03-27 ENCOUNTER — Other Ambulatory Visit: Payer: Self-pay

## 2021-03-27 DIAGNOSIS — N401 Enlarged prostate with lower urinary tract symptoms: Secondary | ICD-10-CM

## 2021-03-27 DIAGNOSIS — N138 Other obstructive and reflux uropathy: Secondary | ICD-10-CM

## 2021-03-28 LAB — SURGICAL PATHOLOGY

## 2021-04-01 ENCOUNTER — Telehealth: Payer: Self-pay

## 2021-04-01 NOTE — Telephone Encounter (Signed)
-----   Message from Sondra Come, MD sent at 03/31/2021  8:59 AM EDT ----- Good news, no cancer seen on HOLEP tissue, keep follow-up as scheduled

## 2021-05-22 ENCOUNTER — Encounter: Payer: Self-pay | Admitting: Urology

## 2021-05-22 ENCOUNTER — Ambulatory Visit (INDEPENDENT_AMBULATORY_CARE_PROVIDER_SITE_OTHER): Payer: Medicare Other | Admitting: Urology

## 2021-05-22 ENCOUNTER — Other Ambulatory Visit: Payer: Self-pay

## 2021-05-22 VITALS — BP 155/103 | HR 69 | Ht 72.0 in | Wt 250.0 lb

## 2021-05-22 DIAGNOSIS — R972 Elevated prostate specific antigen [PSA]: Secondary | ICD-10-CM

## 2021-05-22 DIAGNOSIS — N138 Other obstructive and reflux uropathy: Secondary | ICD-10-CM

## 2021-05-22 DIAGNOSIS — N401 Enlarged prostate with lower urinary tract symptoms: Secondary | ICD-10-CM

## 2021-05-22 LAB — BLADDER SCAN AMB NON-IMAGING

## 2021-05-22 NOTE — Patient Instructions (Signed)
Consider resuming your CPAP machine, this often helps significantly with overnight urination.  Also minimizing fluids 3 to 4 hours before bedtime, wearing compression socks during the day, and elevating the legs in the mid afternoon can also help with overnight urination.  Avoid drinks with lots of caffeine or diet flavoring as these can irritate the bladder.  Kegel Exercises Kegel exercises can help strengthen your pelvic floor muscles. The pelvic floor is a group of muscles that support your rectum, small intestine, and bladder. In females, pelvic floor muscles also help support the uterus. These muscles help you control the flow of urine and stool (feces). Kegel exercises are painless and simple. They do not require any equipment. Your provider may suggest Kegel exercises to: Improve bladder and bowel control. Improve sexual response. Improve weak pelvic floor muscles after surgery to remove the uterus (hysterectomy) or after pregnancy, in females. Improve weak pelvic floor muscles after prostate gland removal or surgery, in males. Kegel exercises involve squeezing your pelvic floor muscles. These are the same muscles you squeeze when you try to stop the flow of urine or keep from passing gas. The exercises can be done while sitting, standing, or lying down, but it is best to vary your position. Ask your health care provider which exercises are safe for you. Do exercises exactly as told by your health care provider and adjust them as directed. Do not begin these exercises until told by your health care provider. Exercises How to do Kegel exercises: Squeeze your pelvic floor muscles tight. You should feel a tight lift in your rectal area. If you are a male, you should also feel a tightness in your vaginal area. Keep your stomach, buttocks, and legs relaxed. Hold the muscles tight for up to 10 seconds. Breathe normally. Relax your muscles for up to 10 seconds. Repeat as told by your health care  provider. Repeat this exercise daily as told by your health care provider. Continue to do this exercise for at least 4-6 weeks, or for as long as told by your health care provider. You may be referred to a physical therapist who can help you learn more about how to do Kegel exercises. Depending on your condition, your health care provider may recommend: Varying how long you squeeze your muscles. Doing several sets of exercises every day. Doing exercises for several weeks. Making Kegel exercises a part of your regular exercise routine. This information is not intended to replace advice given to you by your health care provider. Make sure you discuss any questions you have with your health care provider. Document Revised: 10/03/2020 Document Reviewed: 10/03/2020 Elsevier Patient Education  2022 [employer].

## 2021-05-22 NOTE — Progress Notes (Signed)
° °  05/22/2021 8:50 AM   Jeff Warren [birth year] [id number]  Reason for visit: Follow up BPH and retention status post HOLEP, elevated PSA  HPI: 65 year old male who was referred from Dr. Alvester Morin in [facility] for BPH and Foley dependent urinary retention with 175 g prostate.  He also had elevated PSA of 19 and had a negative prostate biopsy, and urodynamics that showed a functional bladder.  He underwent an uncomplicated HOLEP on 03/25/2021 with removal of 80 g of benign tissue.  He is urinating well with a strong stream, and is having some mild persistent stress incontinence that continues to improve, especially over the last few weeks.  He has nocturia 2-4 times overnight.  PVR is normal at 29 mL today.  He has sleep apnea, but is noncompliant with CPAP over his last few years.  I encouraged him to try behavioral strategies including minimizing fluids in the evenings, voiding prior to bedtime, lower extremity stockings, and resuming CPAP to help with his nocturia.  I also encouraged Kegel exercises, and expect his incontinence will continue to improve over the next few weeks and months.  RTC 6 months PVR, PSA prior   Sondra Come, MD  [facility] [address], [address] [place], [place] [address] [phone]

## 2021-06-04 ENCOUNTER — Encounter: Payer: Self-pay | Admitting: *Deleted

## 2021-11-11 ENCOUNTER — Other Ambulatory Visit: Payer: Medicare Other

## 2021-11-14 ENCOUNTER — Encounter: Payer: Self-pay | Admitting: Urology

## 2021-11-20 ENCOUNTER — Ambulatory Visit: Payer: Medicare Other | Admitting: Urology

## 2021-11-26 ENCOUNTER — Ambulatory Visit (INDEPENDENT_AMBULATORY_CARE_PROVIDER_SITE_OTHER): Payer: Medicare Other | Admitting: Urology

## 2021-11-26 ENCOUNTER — Encounter: Payer: Self-pay | Admitting: Urology

## 2021-11-26 VITALS — BP 156/95 | HR 82 | Ht 73.0 in | Wt 240.0 lb

## 2021-11-26 DIAGNOSIS — R351 Nocturia: Secondary | ICD-10-CM

## 2021-11-26 DIAGNOSIS — N138 Other obstructive and reflux uropathy: Secondary | ICD-10-CM

## 2021-11-26 DIAGNOSIS — R972 Elevated prostate specific antigen [PSA]: Secondary | ICD-10-CM

## 2021-11-26 DIAGNOSIS — N401 Enlarged prostate with lower urinary tract symptoms: Secondary | ICD-10-CM

## 2021-11-26 LAB — BLADDER SCAN AMB NON-IMAGING

## 2021-11-26 NOTE — Patient Instructions (Signed)
Check out https://www.SayEspanol.at -this is a device that treat sleep apnea that is not mask related and many patients have been very happy with this.  I think if you treated your sleep apnea your overnight urination will improve significantly.  Sleep Apnea Sleep apnea is a condition in which breathing pauses or becomes shallow during sleep. People with sleep apnea usually snore loudly. They may have times when they gasp and stop breathing for 10 seconds or more during sleep. This may happen many times during the night. Sleep apnea disrupts your sleep and keeps your body from getting the rest that it needs. This condition can increase your risk of certain health problems, including: Heart attack. Stroke. Obesity. Type 2 diabetes. Heart failure. Irregular heartbeat. High blood pressure. The goal of treatment is to help you breathe normally again. What are the causes?  The most common cause of sleep apnea is a collapsed or blocked airway. There are three kinds of sleep apnea: Obstructive sleep apnea. This kind is caused by a blocked or collapsed airway. Central sleep apnea. This kind happens when the part of the brain that controls breathing does not send the correct signals to the muscles that control breathing. Mixed sleep apnea. This is a combination of obstructive and central sleep apnea. What increases the risk? You are more likely to develop this condition if you: Are overweight. Smoke. Have a smaller than normal airway. Are older. Are male. Drink alcohol. Take sedatives or tranquilizers. Have a family history of sleep apnea. Have a tongue or tonsils that are larger than normal. What are the signs or symptoms? Symptoms of this condition include: Trouble staying asleep. Loud snoring. Morning headaches. Waking up gasping. Dry mouth or sore throat in the morning. Daytime sleepiness and tiredness. If you have daytime fatigue because of sleep apnea, you may be more likely  to have: Trouble concentrating. Forgetfulness. Irritability or mood swings. Personality changes. Feelings of depression. Sexual dysfunction. This may include loss of interest if you are male, or erectile dysfunction if you are male. How is this diagnosed? This condition may be diagnosed with: A medical history. A physical exam. A series of tests that are done while you are sleeping (sleep study). These tests are usually done in a sleep lab, but they may also be done at home. How is this treated? Treatment for this condition aims to restore normal breathing and to ease symptoms during sleep. It may involve managing health issues that can affect breathing, such as high blood pressure or obesity. Treatment may include: Sleeping on your side. Using a decongestant if you have nasal congestion. Avoiding the use of depressants, including alcohol, sedatives, and narcotics. Losing weight if you are overweight. Making changes to your diet. Quitting smoking. Using a device to open your airway while you sleep, such as: An oral appliance. This is a custom-made mouthpiece that shifts your lower jaw forward. A continuous positive airway pressure (CPAP) device. This device blows air through a mask when you breathe out (exhale). A nasal expiratory positive airway pressure (EPAP) device. This device has valves that you put into each nostril. A bi-level positive airway pressure (BIPAP) device. This device blows air through a mask when you breathe in (inhale) and breathe out (exhale). Having surgery if other treatments do not work. During surgery, excess tissue is removed to create a wider airway. Follow these instructions at home: Lifestyle Make any lifestyle changes that your health care provider recommends. Eat a healthy, well-balanced diet. Take steps to lose  weight if you are overweight. Avoid using depressants, including alcohol, sedatives, and narcotics. Do not use any products that contain  nicotine or tobacco. These products include cigarettes, chewing tobacco, and vaping devices, such as e-cigarettes. If you need help quitting, ask your health care provider. General instructions Take over-the-counter and prescription medicines only as told by your health care provider. If you were given a device to open your airway while you sleep, use it only as told by your health care provider. If you are having surgery, make sure to tell your health care provider you have sleep apnea. You may need to bring your device with you. Keep all follow-up visits. This is important. Contact a health care provider if: The device that you received to open your airway during sleep is uncomfortable or does not seem to be working. Your symptoms do not improve. Your symptoms get worse. Get help right away if: You develop: Chest pain. Shortness of breath. Discomfort in your back, arms, or stomach. You have: Trouble speaking. Weakness on one side of your body. Drooping in your face. These symptoms may represent a serious problem that is an emergency. Do not wait to see if the symptoms will go away. Get medical help right away. Call your local emergency services (911 in the [place] Do not drive yourself to the hospital. Summary Sleep apnea is a condition in which breathing pauses or becomes shallow during sleep. The most common cause is a collapsed or blocked airway. The goal of treatment is to restore normal breathing and to ease symptoms during sleep. This information is not intended to replace advice given to you by your health care provider. Make sure you discuss any questions you have with your health care provider. Document Revised: 01/01/2021 Document Reviewed: 05/03/2020 Elsevier Patient Education  [place].

## 2021-11-26 NOTE — Progress Notes (Signed)
   11/26/2021 1:12 PM   Jeff Warren 11/28/1955 [id number]  Reason for visit: Follow up BPH and retention status post HOLEP, elevated PSA  HPI: 66 year old male who was referred from Dr. Alvester Morin in [facility] for BPH and Foley dependent urinary retention with 175 g prostate.  He also had elevated PSA of 19 and had a negative prostate biopsy, and urodynamics that showed a functional bladder.  He underwent an uncomplicated HOLEP on 03/25/2021 with removal of 80 g of benign tissue.  I had recommended a repeat PSA post HOLEP with his significantly elevated PSA of 19 prior, and PSA today is pending.  He is doing very well since that time and is urinating with a strong stream.  PVR is normal today at 15 mL.  He is having very minimal stress incontinence with heavy lifting that does not require a pad.  He continues to have nocturia 3-4 times overnight, but has remained noncompliant with CPAP machine, and suspect this is the etiology of his nocturia.  I encouraged him again to demise fluids in the evening, void prior to bedtime, and resume CPAP use.  We also discussed other alternatives for sleep apnea including the inspire device.  RTC 1 year PVR Call with PSA results   Sondra Come, MD  [facility] [address], [address] [place], [place] [address] [phone]

## 2021-11-27 LAB — PSA: Prostate Specific Ag, Serum: 1.5 ng/mL (ref 0.0–4.0)

## 2022-12-02 ENCOUNTER — Ambulatory Visit: Payer: Medicare Other | Admitting: Urology

## 2022-12-02 ENCOUNTER — Encounter: Payer: Self-pay | Admitting: Urology

## 2022-12-02 ENCOUNTER — Ambulatory Visit (INDEPENDENT_AMBULATORY_CARE_PROVIDER_SITE_OTHER): Payer: Medicare Other | Admitting: Urology

## 2022-12-02 VITALS — BP 144/97 | HR 80 | Ht 72.0 in | Wt 260.0 lb

## 2022-12-02 DIAGNOSIS — N138 Other obstructive and reflux uropathy: Secondary | ICD-10-CM

## 2022-12-02 DIAGNOSIS — N401 Enlarged prostate with lower urinary tract symptoms: Secondary | ICD-10-CM

## 2022-12-02 DIAGNOSIS — Z125 Encounter for screening for malignant neoplasm of prostate: Secondary | ICD-10-CM | POA: Diagnosis not present

## 2022-12-02 LAB — BLADDER SCAN AMB NON-IMAGING

## 2022-12-02 NOTE — Progress Notes (Signed)
   12/02/2022 10:54 AM   Jeff Warren 30-May-1956 [id number]  Reason for visit: Follow up BPH and retention status post HOLEP, elevated PSA, nocturia, ED  HPI: 67 year old male who was referred from Dr. Alvester Morin in [facility] for BPH and Foley dependent urinary retention with 175 g prostate.  He also had elevated PSA of 19 and had a negative prostate biopsy, and urodynamics that showed a functional bladder.  He underwent an uncomplicated HOLEP on 03/25/2021 with removal of 80 g of benign tissue.  Postop PSA decreased appropriately to 1.5.  He is doing very well since that time and is urinating with a strong stream.  PVR is normal today at 0 mL.  He is having very minimal stress incontinence with heavy lifting that does not require a pad.  He continues to have nocturia 2-3 times overnight, but has remained noncompliant with CPAP machine, and also drinks a fair amount of soda in the evening likely contributing to his persistent nocturia.   I encouraged him again to decrease fluids in the evening, void prior to bedtime, and resume CPAP use.    He also has a long-term history of reported low testosterone and ED.  No recent values to review.  He was previously on AndroGel through Dr. Achilles Dunk but discontinued that medication.  We had a long conversation today about risks and benefits of testosterone replacement and I offered him a testosterone lab check today but he deferred.  He is not interested in replacement at this time.  We also discussed medications for ED, but again he is not interested at this time.  Follow-up with urology as needed, okay to check testosterone in the future if patient desires   Sondra Come, MD  [facility] [address] [place], [place] [address] [phone]

## 2022-12-02 NOTE — Patient Instructions (Signed)
Nocturia refers to the need to wake up during the night to urinate, which can disrupt your sleep and impact your overall well-being. Fortunately, there are several strategies you can employ to help prevent or manage nocturia. It's important to consult with your healthcare provider before making any significant changes to your routine. Here are some helpful strategies to consider:  Sleep apnea is one of the most common causes of overnight urination.  Using your CPAP can decrease overnight urination, and help you feel better with more energy during the day  Limit Fluid Intake Before Bed: Avoid drinking large amounts of fluids in the evening, especially within a few hours of bedtime. Consume most of your daily fluid intake earlier in the day to reduce the need to urinate at night.  Monitor Your Diet: Limit your intake of caffeine and alcohol, as these substances can increase urine production and irritate the bladder.  Avoid diet, "zero calorie," and artificially sweetened drinks, especially sodas, in the afternoon or evening. Be mindful of consuming foods and drinks with high water content before bedtime, such as watermelon and herbal teas.  Time Your Medications: If you're taking medications that contribute to increased urination, consult your healthcare provider about adjusting the timing of these medications to minimize their impact during the night.  Practice Double Voiding: Before going to bed, make an effort to empty your bladder twice within a short period. This can help reduce the amount of urine left in your bladder before sleep.  Bladder Training: Gradually increase the time between bathroom visits during the day to train your bladder to hold larger volumes of urine. Over time, this can help reduce the frequency of nighttime awakenings to urinate.  Elevate Your Legs During the Day: Elevating your legs during the day can help minimize fluid retention in your lower extremities, which  might reduce nighttime urination.  Pelvic Floor Exercises: Strengthening your pelvic floor muscles through Kegel exercises can help improve bladder control and potentially reduce the urge to urinate at night.  Create a Relaxing Bedtime Routine: Stress and anxiety can exacerbate nocturia. Engage in calming activities before bed, such as reading, listening to soothing music, or practicing relaxation techniques.  Stay Active: Engage in regular physical activity, but avoid intense exercise close to bedtime, as this can increase your body's demand for fluids.  Maintain a Healthy Weight: Excess weight can compress the bladder and contribute to bladder and urinary issues. Aim to achieve and maintain a healthy weight through a balanced diet and regular exercise.  Remember that every individual is unique, and the effectiveness of these strategies may vary. It's important to work with your healthcare provider to develop a plan that suits your specific needs and addresses any underlying causes of nocturia.

## 2023-04-29 ENCOUNTER — Ambulatory Visit: Payer: Medicare Other | Attending: Cardiovascular Disease | Admitting: Cardiovascular Disease

## 2023-04-29 ENCOUNTER — Encounter: Payer: Self-pay | Admitting: Cardiovascular Disease

## 2023-04-29 VITALS — BP 160/100 | HR 63 | Ht 72.0 in | Wt 259.1 lb

## 2023-04-29 DIAGNOSIS — E782 Mixed hyperlipidemia: Secondary | ICD-10-CM | POA: Diagnosis present

## 2023-04-29 DIAGNOSIS — I25118 Atherosclerotic heart disease of native coronary artery with other forms of angina pectoris: Secondary | ICD-10-CM | POA: Diagnosis present

## 2023-04-29 DIAGNOSIS — R0602 Shortness of breath: Secondary | ICD-10-CM | POA: Diagnosis present

## 2023-04-29 DIAGNOSIS — I10 Essential (primary) hypertension: Secondary | ICD-10-CM | POA: Diagnosis present

## 2023-04-29 DIAGNOSIS — I5022 Chronic systolic (congestive) heart failure: Secondary | ICD-10-CM | POA: Insufficient documentation

## 2023-04-29 DIAGNOSIS — R079 Chest pain, unspecified: Secondary | ICD-10-CM | POA: Insufficient documentation

## 2023-04-29 MED ORDER — CARVEDILOL 6.25 MG PO TABS: 6.2500 mg | ORAL_TABLET | Freq: Two times a day (BID) | ORAL | 1 refills | Status: DC

## 2023-04-29 MED ORDER — ROSUVASTATIN CALCIUM 20 MG PO TABS: 20.0000 mg | ORAL_TABLET | Freq: Every day | ORAL | 1 refills | Status: DC

## 2023-04-29 MED ORDER — FUROSEMIDE 20 MG PO TABS: 20.0000 mg | ORAL_TABLET | Freq: Every day | ORAL | 0 refills | Status: DC | PRN

## 2023-04-29 MED ORDER — LOSARTAN POTASSIUM 100 MG PO TABS: 100.0000 mg | ORAL_TABLET | Freq: Every morning | ORAL | 1 refills | Status: DC

## 2023-04-29 NOTE — Progress Notes (Signed)
Cardiology Office Note   Date:  04/29/2023   ID:  Jeff Warren, DOB 06-20-1955, MRN [id number]  PCP:  Patient, No Pcp Per  Cardiologist:   [name], MD   Chief Complaint  Patient presents with   Follow-up    12 month f/u c/o sob, chest discomfort and d/c crestor due to joint/muscle pain. Meds reviewed verbally with pt.      History of Present Illness: Jeff Warren is a 67 y.o. male who presents for a follow-up visit regarding coronary artery disease.  He had inferior ST elevation myocardial infarction in June 2018.  Emergent cardiac catheterization showed occluded distal right coronary artery which was treated successfully with PCI and drug-eluting stent placement.  There was 80% proximal LAD stenosis which was subsequently treated with staged PCI.    He has known history of severe hyperlipidemia with intolerance to higher dose of atorvastatin.    Most recent echocardiogram in September 2022 showed an EF of 45 to 50% with mild mitral regurgitation.  The patient has known history of poor follow-up and adherence to medications.  He has not been seen by our office in 2 years.  He stopped almost all his medications and takes them intermittently.  Over the last few weeks, he experienced significant exertional dyspnea with intermittent chest tightness very similar to his previous angina.  He does have some Lasix and has been taking the medication intermittently to help with his symptoms.    Past Medical History:  Diagnosis Date   Aortic root dilation (HCC)    a.) TTE 02/26/2021: mild; measured 40 mm.   BPH (benign prostatic hyperplasia)    CAD (coronary artery disease)    a. inferior ST elevation MI 12/05/16: cardiac cath LM nl, p-mLAD 80%, D1 CTO with right-to-left collats, LCx minor irregs,  m-dRCA 30%, dRCA 100% s/p PCI/DES, EF 45-50%, severely elevated LVEDP; b. staged PCI 12/23/16 - successful PCI/DES to p-mLAD with small diag being jailed by stent (vessel known to be subtotally  occluded with R-L collats)   Dyspnea    Family history of premature CAD    a. sister passed from MI at age 22   GERD (gastroesophageal reflux disease)    Gout    HLD (hyperlipidemia)    Hypertension    LAFB (left anterior fascicular block)    ST elevation myocardial infarction (STEMI) of inferior wall (HCC) 12/05/2016   a.) LHC --> EF 45% with severely elevated LVEDP; 2v CAD; culprit was thrombotic occlusion of dRCA; D1 occluded with R-L collaterals; 80% p-mLAD. PCI performed placing a 3.5 x 15 mm Resolute Onyx DES x 1 to dRCA. Plans for staged PCI of LAD.   Systolic dysfunction    a.) TTE 12/05/16: EF 50-55%, inf HK, GR1DD, mildly dilated LA. b.) TTE 02/26/21: EF 45-50%, inferolateral HK, mild MR, G2DD.   Urinary retention     Past Surgical History:  Procedure Laterality Date   ABDOMINOPLASTY     CATARACT EXTRACTION Bilateral    COLONOSCOPY WITH PROPOFOL N/A 06/29/2016   Procedure: COLONOSCOPY WITH PROPOFOL;  Surgeon: Charolett Bumpers, MD;  Location: [facility] ENDOSCOPY;  Service: Endoscopy;  Laterality: N/A;   CORONARY STENT INTERVENTION N/A 12/23/2016   Procedure: Coronary Stent Intervention;  Surgeon: Iran Ouch, MD;  Location: [facility] INVASIVE CV LAB;  Service: Cardiovascular;  Laterality: N/A;   CORONARY/GRAFT ACUTE MI REVASCULARIZATION N/A 12/05/2016   Procedure: Coronary/Graft Acute MI Revascularization;  Surgeon: Iran Ouch, MD;  Location: [facility] INVASIVE CV  LAB;  Service: Cardiovascular;  Laterality: N/A;   ganglion cyst removal Right    GAS INSERTION Left 11/21/2020   Procedure: INSERTION OF GAS LEFT EYE (C3F8);  Surgeon: Stephannie Li, MD;  Location: [facility] OR;  Service: Ophthalmology;  Laterality: Left;   GAS/FLUID EXCHANGE Left 11/21/2020   Procedure: GAS/FLUID EXCHANGE;  Surgeon: Stephannie Li, MD;  Location: [facility] OR;  Service: Ophthalmology;  Laterality: Left;   HOLEP-LASER ENUCLEATION OF THE PROSTATE WITH MORCELLATION N/A 03/25/2021   Procedure: HOLEP-LASER ENUCLEATION OF  THE PROSTATE WITH MORCELLATION;  Surgeon: Sondra Come, MD;  Location: [facility] ORS;  Service: Urology;  Laterality: N/A;   LEFT HEART CATH AND CORONARY ANGIOGRAPHY N/A 12/05/2016   Procedure: Left Heart Cath and Coronary Angiography;  Surgeon: Iran Ouch, MD;  Location: [facility] INVASIVE CV LAB;  Service: Cardiovascular;  Laterality: N/A;   NO PAST SURGERIES     Has has some cosmetic surgeries   RETINAL DETACHMENT SURGERY     VITRECTOMY 25 GAUGE WITH SCLERAL BUCKLE Left 11/21/2020   Procedure: VITRECTOMY 25 GAUGE WITH SCLERAL BUCKLE;  Surgeon: Stephannie Li, MD;  Location: [facility] OR;  Service: Ophthalmology;  Laterality: Left;     Current Outpatient Medications  Medication Sig Dispense Refill   fluticasone (FLONASE) 50 MCG/ACT nasal spray Place 1 spray into both nostrils at bedtime.     furosemide (LASIX) 20 MG tablet Take 20 mg by mouth daily as needed for edema.     indomethacin (INDOCIN) 50 MG capsule Take 50 mg by mouth 2 (two) times daily as needed (gout flares).     omeprazole (PRILOSEC OTC) 20 MG tablet Take 20 mg by mouth every other day.     Potassium 99 MG TABS Take 99 mg by mouth daily as needed (cramps).     acetaminophen (TYLENOL) 500 MG tablet Take 500-1,000 mg by mouth every 6 (six) hours as needed for moderate pain.     aspirin EC 81 MG tablet Take 81 mg by mouth daily. Swallow whole. (Patient not taking: Reported on 04/29/2023)     Carboxymethylcell-Hypromellose (GENTEAL OP) Place 1 drop into both eyes daily as needed (dry eyes).     carvedilol (COREG) 6.25 MG tablet Take 6.25 mg by mouth 2 (two) times daily.     Cholecalciferol (DIALYVITE VITAMIN D 5000) 125 MCG (5000 UT) capsule Take 5,000 Units by mouth daily.     Coenzyme Q10 300 MG CAPS Take 300 mg by mouth daily. (Patient not taking: Reported on 04/29/2023)     ezetimibe (ZETIA) 10 MG tablet Take 1 tablet (10 mg total) by mouth daily. (Patient not taking: Reported on 04/29/2023) 90 tablet 3   losartan (COZAAR) 100 MG  tablet Take 100 mg by mouth in the morning. (Patient not taking: Reported on 04/29/2023)     NITROGLYCERIN RE Place 1 application rectally daily as needed (rectal bleeding). 0.2% (Patient not taking: Reported on 04/29/2023)     rosuvastatin (CRESTOR) 40 MG tablet Take 1 tablet (40 mg total) by mouth daily. Please call to schedule appointment for further refills. Thank you! (Patient not taking: Reported on 04/29/2023) 30 tablet 0   No current facility-administered medications for this visit.    Allergies:   Bextra [valdecoxib]    Social History:  The patient  reports that he has never smoked. He has never been exposed to tobacco smoke. He has never used smokeless tobacco. He reports that he does not drink alcohol and does not use drugs.   Family History:  The  patient's family history includes Cancer in his father; Diabetes Mellitus II in his brother, mother, and sister; Kidney failure in his brother.    ROS:  Please see the history of present illness.   Otherwise, review of systems are positive for none.   All other systems are reviewed and negative.    PHYSICAL EXAM: VS:  BP (!) 160/100 (BP Location: Left Arm, Patient Position: Sitting, Cuff Size: Large)   Pulse 63   Ht 6' (1.829 m)   Wt 259 lb 2 oz (117.5 kg)   SpO2 97%   BMI 35.14 kg/m  , BMI Body mass index is 35.14 kg/m. GEN: Well nourished, well developed, in no acute distress  HEENT: normal  Neck: no JVD, carotid bruits, or masses Cardiac: RRR; no murmurs, rubs, or gallops,no edema  Respiratory:  clear to auscultation bilaterally, normal work of breathing GI: soft, nontender, nondistended, + BS MS: no deformity or atrophy  Skin: warm and dry, no rash Neuro:  Strength and sensation are intact Psych: euthymic mood, full affect   EKG:  EKG is ordered today. The ekg ordered today demonstrates : Normal sinus rhythm Left anterior fascicular block Minimal voltage criteria for LVH, may be normal variant ( [employer] product  ) Nonspecific T wave abnormality       Recent Labs: No results found for requested labs within last 365 days.    Lipid Panel    Component Value Date/Time   CHOL 152 02/06/2021 0928   TRIG 161 (H) 02/06/2021 0928   HDL 34 (L) 02/06/2021 0928   CHOLHDL 4.5 02/06/2021 0928   CHOLHDL 6.0 12/06/2016 0546   VLDL 34 12/06/2016 0546   LDLCALC 90 02/06/2021 0928      Wt Readings from Last 3 Encounters:  04/29/23 259 lb 2 oz (117.5 kg)  12/02/22 260 lb (117.9 kg)  11/26/21 240 lb (108.9 kg)           No data to display            ASSESSMENT AND PLAN:  1.  Coronary artery disease involving native coronary arteries with worsening angina: This is in the setting of not taking his cardiac medications and uncontrolled hypertension.  He is at high risk for recurrent restenosis and progressive disease given that he has not addressed his risk factors very well.  I recommend evaluation with cardiac PET scan.  He is not able to exercise on a treadmill and has known history of coronary artery disease.    2.  Chronic systolic heart failure due to ischemic cardiomyopathy: Most recent EF of 45 to 50%.  I requested a repeat echocardiogram.  Consider switching losartan to Entresto and adding an SGLT2 inhibitor.  I asked him to resume carvedilol and losartan today.  I refilled furosemide to be used as needed and will check routine labs.  If EF is less than 40%, will consider adding spironolactone as well.  3.  Hyperlipidemia: He had some leg pain on high-dose rosuvastatin but he is not sure if stopping the medication has helped or not.  I elected to resume rosuvastatin at a lower dose of 20 mg daily.  4.  Essential hypertension: His blood pressure is not controlled likely due to not taking his medications.  I refilled carvedilol and losartan.   Disposition:   FU after cardiac testing.  Signed,  [name], MD  04/29/2023 8:27 AM    [facility]

## 2023-04-29 NOTE — H&P (View-Only) (Signed)
 Cardiology Office Note   Date:  04/29/2023   ID:  Jeff Warren, DOB 06-20-1955, MRN [id number]  PCP:  Patient, No Pcp Per  Cardiologist:   [name], MD   Chief Complaint  Patient presents with   Follow-up    12 month f/u c/o sob, chest discomfort and d/c crestor due to joint/muscle pain. Meds reviewed verbally with pt.      History of Present Illness: Jeff Warren is a 67 y.o. male who presents for a follow-up visit regarding coronary artery disease.  He had inferior ST elevation myocardial infarction in June 2018.  Emergent cardiac catheterization showed occluded distal right coronary artery which was treated successfully with PCI and drug-eluting stent placement.  There was 80% proximal LAD stenosis which was subsequently treated with staged PCI.    He has known history of severe hyperlipidemia with intolerance to higher dose of atorvastatin.    Most recent echocardiogram in September 2022 showed an EF of 45 to 50% with mild mitral regurgitation.  The patient has known history of poor follow-up and adherence to medications.  He has not been seen by our office in 2 years.  He stopped almost all his medications and takes them intermittently.  Over the last few weeks, he experienced significant exertional dyspnea with intermittent chest tightness very similar to his previous angina.  He does have some Lasix and has been taking the medication intermittently to help with his symptoms.    Past Medical History:  Diagnosis Date   Aortic root dilation (HCC)    a.) TTE 02/26/2021: mild; measured 40 mm.   BPH (benign prostatic hyperplasia)    CAD (coronary artery disease)    a. inferior ST elevation MI 12/05/16: cardiac cath LM nl, p-mLAD 80%, D1 CTO with right-to-left collats, LCx minor irregs,  m-dRCA 30%, dRCA 100% s/p PCI/DES, EF 45-50%, severely elevated LVEDP; b. staged PCI 12/23/16 - successful PCI/DES to p-mLAD with small diag being jailed by stent (vessel known to be subtotally  occluded with R-L collats)   Dyspnea    Family history of premature CAD    a. sister passed from MI at age 22   GERD (gastroesophageal reflux disease)    Gout    HLD (hyperlipidemia)    Hypertension    LAFB (left anterior fascicular block)    ST elevation myocardial infarction (STEMI) of inferior wall (HCC) 12/05/2016   a.) LHC --> EF 45% with severely elevated LVEDP; 2v CAD; culprit was thrombotic occlusion of dRCA; D1 occluded with R-L collaterals; 80% p-mLAD. PCI performed placing a 3.5 x 15 mm Resolute Onyx DES x 1 to dRCA. Plans for staged PCI of LAD.   Systolic dysfunction    a.) TTE 12/05/16: EF 50-55%, inf HK, GR1DD, mildly dilated LA. b.) TTE 02/26/21: EF 45-50%, inferolateral HK, mild MR, G2DD.   Urinary retention     Past Surgical History:  Procedure Laterality Date   ABDOMINOPLASTY     CATARACT EXTRACTION Bilateral    COLONOSCOPY WITH PROPOFOL N/A 06/29/2016   Procedure: COLONOSCOPY WITH PROPOFOL;  Surgeon: Charolett Bumpers, MD;  Location: [facility];  Service: Endoscopy;  Laterality: N/A;   CORONARY STENT INTERVENTION N/A 12/23/2016   Procedure: Coronary Stent Intervention;  Surgeon: Iran Ouch, MD;  Location: [facility] INVASIVE CV LAB;  Service: Cardiovascular;  Laterality: N/A;   CORONARY/GRAFT ACUTE MI REVASCULARIZATION N/A 12/05/2016   Procedure: Coronary/Graft Acute MI Revascularization;  Surgeon: Iran Ouch, MD;  Location: [facility] INVASIVE CV  LAB;  Service: Cardiovascular;  Laterality: N/A;   ganglion cyst removal Right    GAS INSERTION Left 11/21/2020   Procedure: INSERTION OF GAS LEFT EYE (C3F8);  Surgeon: Stephannie Li, MD;  Location: [facility] OR;  Service: Ophthalmology;  Laterality: Left;   GAS/FLUID EXCHANGE Left 11/21/2020   Procedure: GAS/FLUID EXCHANGE;  Surgeon: Stephannie Li, MD;  Location[facility] OR;  Service: Ophthalmology;  Laterality: Left;   HOLEP-LASER ENUCLEATION OF THE PROSTATE WITH MORCELLATION N/A 03/25/2021   Procedure: HOLEP-LASER ENUCLEATION OF  THE PROSTATE WITH MORCELLATION;  Surgeon: Sondra Come, MD;  Location: [facility] ORS;  Service: Urology;  Laterality: N/A;   LEFT HEART CATH AND CORONARY ANGIOGRAPHY N/A 12/05/2016   Procedure: Left Heart Cath and Coronary Angiography;  Surgeon: Iran Ouch, MD;  Location: [facility] INVASIVE CV LAB;  Service: Cardiovascular;  Laterality: N/A;   NO PAST SURGERIES     Has has some cosmetic surgeries   RETINAL DETACHMENT SURGERY     VITRECTOMY 25 GAUGE WITH SCLERAL BUCKLE Left 11/21/2020   Procedure: VITRECTOMY 25 GAUGE WITH SCLERAL BUCKLE;  Surgeon: Stephannie Li, MD;  Location: [facility] OR;  Service: Ophthalmology;  Laterality: Left;     Current Outpatient Medications  Medication Sig Dispense Refill   fluticasone (FLONASE) 50 MCG/ACT nasal spray Place 1 spray into both nostrils at bedtime.     furosemide (LASIX) 20 MG tablet Take 20 mg by mouth daily as needed for edema.     indomethacin (INDOCIN) 50 MG capsule Take 50 mg by mouth 2 (two) times daily as needed (gout flares).     omeprazole (PRILOSEC OTC) 20 MG tablet Take 20 mg by mouth every other day.     Potassium 99 MG TABS Take 99 mg by mouth daily as needed (cramps).     acetaminophen (TYLENOL) 500 MG tablet Take 500-1,000 mg by mouth every 6 (six) hours as needed for moderate pain.     aspirin EC 81 MG tablet Take 81 mg by mouth daily. Swallow whole. (Patient not taking: Reported on 04/29/2023)     Carboxymethylcell-Hypromellose (GENTEAL OP) Place 1 drop into both eyes daily as needed (dry eyes).     carvedilol (COREG) 6.25 MG tablet Take 6.25 mg by mouth 2 (two) times daily.     Cholecalciferol (DIALYVITE VITAMIN D 5000) 125 MCG (5000 UT) capsule Take 5,000 Units by mouth daily.     Coenzyme Q10 300 MG CAPS Take 300 mg by mouth daily. (Patient not taking: Reported on 04/29/2023)     ezetimibe (ZETIA) 10 MG tablet Take 1 tablet (10 mg total) by mouth daily. (Patient not taking: Reported on 04/29/2023) 90 tablet 3   losartan (COZAAR) 100 MG  tablet Take 100 mg by mouth in the morning. (Patient not taking: Reported on 04/29/2023)     NITROGLYCERIN RE Place 1 application rectally daily as needed (rectal bleeding). 0.2% (Patient not taking: Reported on 04/29/2023)     rosuvastatin (CRESTOR) 40 MG tablet Take 1 tablet (40 mg total) by mouth daily. Please call to schedule appointment for further refills. Thank you! (Patient not taking: Reported on 04/29/2023) 30 tablet 0   No current facility-administered medications for this visit.    Allergies:   Bextra [valdecoxib]    Social History:  The patient  reports that he has never smoked. He has never been exposed to tobacco smoke. He has never used smokeless tobacco. He reports that he does not drink alcohol and does not use drugs.   Family History:  The  patient's family history includes Cancer in his father; Diabetes Mellitus II in his brother, mother, and sister; Kidney failure in his brother.    ROS:  Please see the history of present illness.   Otherwise, review of systems are positive for none.   All other systems are reviewed and negative.    PHYSICAL EXAM: VS:  BP (!) 160/100 (BP Location: Left Arm, Patient Position: Sitting, Cuff Size: Large)   Pulse 63   Ht 6' (1.829 m)   Wt 259 lb 2 oz (117.5 kg)   SpO2 97%   BMI 35.14 kg/m  , BMI Body mass index is 35.14 kg/m. GEN: Well nourished, well developed, in no acute distress  HEENT: normal  Neck: no JVD, carotid bruits, or masses Cardiac: RRR; no murmurs, rubs, or gallops,no edema  Respiratory:  clear to auscultation bilaterally, normal work of breathing GI: soft, nontender, nondistended, + BS MS: no deformity or atrophy  Skin: warm and dry, no rash Neuro:  Strength and sensation are intact Psych: euthymic mood, full affect   EKG:  EKG is ordered today. The ekg ordered today demonstrates : Normal sinus rhythm Left anterior fascicular block Minimal voltage criteria for LVH, may be normal variant ( Cornell product  ) Nonspecific T wave abnormality       Recent Labs: No results found for requested labs within last 365 days.    Lipid Panel    Component Value Date/Time   CHOL 152 02/06/2021 0928   TRIG 161 (H) 02/06/2021 0928   HDL 34 (L) 02/06/2021 0928   CHOLHDL 4.5 02/06/2021 0928   CHOLHDL 6.0 12/06/2016 0546   VLDL 34 12/06/2016 0546   LDLCALC 90 02/06/2021 0928      Wt Readings from Last 3 Encounters:  04/29/23 259 lb 2 oz (117.5 kg)  12/02/22 260 lb (117.9 kg)  11/26/21 240 lb (108.9 kg)           No data to display            ASSESSMENT AND PLAN:  1.  Coronary artery disease involving native coronary arteries with worsening angina: This is in the setting of not taking his cardiac medications and uncontrolled hypertension.  He is at high risk for recurrent restenosis and progressive disease given that he has not addressed his risk factors very well.  I recommend evaluation with cardiac PET scan.  He is not able to exercise on a treadmill and has known history of coronary artery disease.    2.  Chronic systolic heart failure due to ischemic cardiomyopathy: Most recent EF of 45 to 50%.  I requested a repeat echocardiogram.  Consider switching losartan to Entresto and adding an SGLT2 inhibitor.  I asked him to resume carvedilol and losartan today.  I refilled furosemide to be used as needed and will check routine labs.  If EF is less than 40%, will consider adding spironolactone as well.  3.  Hyperlipidemia: He had some leg pain on high-dose rosuvastatin but he is not sure if stopping the medication has helped or not.  I elected to resume rosuvastatin at a lower dose of 20 mg daily.  4.  Essential hypertension: His blood pressure is not controlled likely due to not taking his medications.  I refilled carvedilol and losartan.   Disposition:   FU after cardiac testing.  Signed,  [name], MD  04/29/2023 8:27 AM    [facility]

## 2023-04-29 NOTE — Patient Instructions (Addendum)
Medication Instructions:  RESUME the Aspirin 81 mg once daily  DECREASE the Rosuvastatin to 20 mg once daily  STOP the Ezetimibe (Zetia)  *If you need a refill on your cardiac medications before your next appointment, please call your pharmacy*   Lab Work: Your provider would like for you to have following labs drawn today CBC, CMET and Lipid.   If you have labs (blood work) drawn today and your tests are completely normal, you will receive your results only by: MyChart Message (if you have MyChart) OR A paper copy in the mail If you have any lab test that is abnormal or we need to change your treatment, we will call you to review the results.   Testing/Procedures: CARDIAC PET- Your physician has requested that you have a Cardiac Pet Stress Test.   This testing is completed at [facility] ([address] [place], [place] [address]) or [facility] ([address], [place], [place]). Please arrive 30 minutes prior to your scheduled time.  The schedulers will call you to get this scheduled. Please follow further testing instructions below.  Your physician has requested that you have an echocardiogram. Echocardiography is a painless test that uses sound waves to create images of your heart. It provides your doctor with information about the size and shape of your heart and how well your heart's chambers and valves are working.   You may receive an ultrasound enhancing agent through an IV if needed to better visualize your heart during the echo. This procedure takes approximately one hour.  There are no restrictions for this procedure.  This will take place at [address] [facility], [place] [address]  Please note: We ask at that you not bring children with you during ultrasound (echo/ vascular) testing. Due to room size and safety concerns, children are not allowed in the ultrasound rooms during exams. Our front  office staff cannot provide observation of children in our lobby area while testing is being conducted. An adult accompanying a patient to their appointment will only be allowed in the ultrasound room at the discretion of the ultrasound technician under special circumstances. We apologize for any inconvenience.     Follow-Up: At [facility], you and your health needs are our priority.  As part of our continuing mission to provide you with exceptional heart care, we have created designated Provider Care Teams.  These Care Teams include your primary Cardiologist (physician) and Advanced Practice Providers (APPs -  Physician Assistants and Nurse Practitioners) who all work together to provide you with the care you need, when you need it.  We recommend signing up for the patient portal called "MyChart".  Sign up information is provided on this After Visit Summary.  MyChart is used to connect with patients for Virtual Visits (Telemedicine).  Patients are able to view lab/test results, encounter notes, upcoming appointments, etc.  Non-urgent messages can be sent to your provider as well.   To learn more about what you can do with MyChart, go to [email or web address].    Your next appointment:   2 month(s)  Provider:   You may see [name], MD or one of the following Advanced Practice Providers on your designated Care Team:   Nicolasa Ducking, NP Eula Listen, PA-C Cadence Fransico Michael, PA-C Charlsie Quest, NP [name], NP    Other Instructions How to Prepare for Your Cardiac PET/CT Stress Test:  1. Please do not take these medications before  your test:   Medications that may interfere with the cardiac pharmacological stress agent (ex. nitrates - including erectile dysfunction medications, isosorbide mononitrate- [please start to hold this medication the day before the test], tamulosin or beta-blockers) the day of the exam. (Erectile dysfunction medication should be held for at  least 72 hrs prior to test) Theophylline containing medications for 12 hours. Dipyridamole 48 hours prior to the test. Your remaining medications may be taken with water. Hold the Carvedilol the morning of the test  2. Nothing to eat or drink, except water, 3 hours prior to arrival time.   NO caffeine/decaffeinated products, or chocolate 12 hours prior to arrival.  3. NO perfume, cologne or lotion on chest or abdomen area.          - FEMALES - Please avoid wearing dresses to this appointment.  4. Total time is 1 to 2 hours; you may want to bring reading material for the waiting time.  5. Please report to Radiology at the [facility] Main Entrance 30 minutes early for your test.  [address] [place], [place] [address]  6. Please report to Radiology at [facility] Main Entrance, medical mall, 30 mins prior to your test.  [address]  [place], [place]  [phone]  Diabetic Preparation:  Hold oral medications. You may take NPH and Lantus insulin. Do not take Humalog or Humulin R (Regular Insulin) the day of your test. Check blood sugars prior to leaving the house. If able to eat breakfast prior to 3 hour fasting, you may take all medications, including your insulin, Do not worry if you miss your breakfast dose of insulin - start at your next meal. Patients who wear a continuous glucose monitor MUST remove the device prior to scanning.  IF YOU THINK YOU MAY BE PREGNANT, OR ARE NURSING PLEASE INFORM THE TECHNOLOGIST.  In preparation for your appointment, medication and supplies will be purchased.  Appointment availability is limited, so if you need to cancel or reschedule, please call the Radiology Department at [phone] [facility]) OR [phone] [facility])  24 hours in advance to avoid a cancellation fee of $100.00  What to Expect After you Arrive:  Once you arrive and check in for your appointment, you will be taken to a preparation  room within the Radiology Department.  A technologist or Nurse will obtain your medical history, verify that you are correctly prepped for the exam, and explain the procedure.  Afterwards,  an IV will be started in your arm and electrodes will be placed on your skin for EKG monitoring during the stress portion of the exam. Then you will be escorted to the PET/CT scanner.  There, staff will get you positioned on the scanner and obtain a blood pressure and EKG.  During the exam, you will continue to be connected to the EKG and blood pressure machines.  A small, safe amount of a radioactive tracer will be injected in your IV to obtain a series of pictures of your heart along with an injection of a stress agent.    After your Exam:  It is recommended that you eat a meal and drink a caffeinated beverage to counter act any effects of the stress agent.  Drink plenty of fluids for the remainder of the day and urinate frequently for the first couple of hours after the exam.  Your doctor will inform you of your test results within 7-10 business days.  For more information and  frequently asked questions, please visit our website : [email or web address]  For questions about your test or how to prepare for your test, please call: Cardiac Imaging Nurse Navigators Office: [phone]

## 2023-04-30 LAB — CBC
Hematocrit: 45 % (ref 37.5–51.0)
Hemoglobin: 15 g/dL (ref 13.0–17.7)
MCH: 31 pg (ref 26.6–33.0)
MCHC: 33.3 g/dL (ref 31.5–35.7)
MCV: 93 fL (ref 79–97)
Platelets: 189 10*3/uL (ref 150–450)
RBC: 4.84 x10E6/uL (ref 4.14–5.80)
RDW: 13.6 % (ref 11.6–15.4)
WBC: 6.9 10*3/uL (ref 3.4–10.8)

## 2023-04-30 LAB — COMPREHENSIVE METABOLIC PANEL
ALT: 45 [IU]/L — ABNORMAL HIGH (ref 0–44)
AST: 31 [IU]/L (ref 0–40)
Albumin: 4.1 g/dL (ref 3.9–4.9)
Alkaline Phosphatase: 86 [IU]/L (ref 44–121)
BUN/Creatinine Ratio: 14 (ref 10–24)
BUN: 21 mg/dL (ref 8–27)
Bilirubin Total: 0.8 mg/dL (ref 0.0–1.2)
CO2: 25 mmol/L (ref 20–29)
Calcium: 9.2 mg/dL (ref 8.6–10.2)
Chloride: 105 mmol/L (ref 96–106)
Creatinine, Ser: 1.55 mg/dL — ABNORMAL HIGH (ref 0.76–1.27)
Globulin, Total: 2.7 g/dL (ref 1.5–4.5)
Glucose: 96 mg/dL (ref 70–99)
Potassium: 4.6 mmol/L (ref 3.5–5.2)
Sodium: 145 mmol/L — ABNORMAL HIGH (ref 134–144)
Total Protein: 6.8 g/dL (ref 6.0–8.5)
eGFR: 49 mL/min/{1.73_m2} — ABNORMAL LOW (ref >59)

## 2023-04-30 LAB — LIPID PANEL
Chol/HDL Ratio: 6.5 ratio — ABNORMAL HIGH (ref 0.0–5.0)
Cholesterol, Total: 234 mg/dL — ABNORMAL HIGH (ref 100–199)
HDL: 36 mg/dL — ABNORMAL LOW (ref >39)
LDL Chol Calc (NIH): 172 mg/dL — ABNORMAL HIGH (ref 0–99)
Triglycerides: 143 mg/dL (ref 0–149)
VLDL Cholesterol Cal: 26 mg/dL (ref 5–40)

## 2023-05-05 ENCOUNTER — Other Ambulatory Visit: Payer: Self-pay | Admitting: *Deleted

## 2023-05-05 DIAGNOSIS — I10 Essential (primary) hypertension: Secondary | ICD-10-CM

## 2023-05-19 ENCOUNTER — Other Ambulatory Visit: Payer: Self-pay

## 2023-05-19 ENCOUNTER — Ambulatory Visit: Payer: Medicare Other | Attending: Cardiovascular Disease

## 2023-05-19 DIAGNOSIS — R0602 Shortness of breath: Secondary | ICD-10-CM

## 2023-05-19 DIAGNOSIS — I10 Essential (primary) hypertension: Secondary | ICD-10-CM

## 2023-05-19 MED ORDER — PERFLUTREN LIPID MICROSPHERE
1.0000 mL | INTRAVENOUS | Status: AC | PRN
  Administered 2023-05-19: 2 mL via INTRAVENOUS

## 2023-05-20 LAB — ECHOCARDIOGRAM COMPLETE
AR max vel: 3.01 cm2
AV Area VTI: 2.87 cm2
AV Area mean vel: 2.65 cm2
AV Mean grad: 2 mm[Hg]
AV Peak grad: 3.4 mm[Hg]
Ao pk vel: 0.92 m/s
Area-P 1/2: 7.16 cm2
Calc EF: 30.6 %
S' Lateral: 4.7 cm
Single Plane A2C EF: 25.2 %
Single Plane A4C EF: 29.3 %

## 2023-05-20 LAB — BASIC METABOLIC PANEL
BUN/Creatinine Ratio: 14 (ref 10–24)
BUN: 18 mg/dL (ref 8–27)
CO2: 24 mmol/L (ref 20–29)
Calcium: 9.2 mg/dL (ref 8.6–10.2)
Chloride: 106 mmol/L (ref 96–106)
Creatinine, Ser: 1.27 mg/dL (ref 0.76–1.27)
Glucose: 78 mg/dL (ref 70–99)
Potassium: 4.6 mmol/L (ref 3.5–5.2)
Sodium: 145 mmol/L — ABNORMAL HIGH (ref 134–144)
eGFR: 62 mL/min/{1.73_m2} (ref >59)

## 2023-05-21 ENCOUNTER — Telehealth: Payer: Self-pay | Admitting: *Deleted

## 2023-05-21 DIAGNOSIS — I25118 Atherosclerotic heart disease of native coronary artery with other forms of angina pectoris: Secondary | ICD-10-CM

## 2023-05-21 DIAGNOSIS — I10 Essential (primary) hypertension: Secondary | ICD-10-CM

## 2023-05-21 NOTE — Telephone Encounter (Signed)
-----   Message from [place] sent at 05/21/2023  9:07 AM EST ----- Inform patient that echo significant drop in his ejection fraction to 30 to 35%.  Given his symptoms and drop in systolic function, I recommend canceling his stress test and schedule a right and left heart catheterization with me

## 2023-05-21 NOTE — Telephone Encounter (Signed)
The patient has been made aware of the results and verbalized his understanding. He would like to think about it over the weekend. We will call back Monday to schedule the proceudre

## 2023-05-24 ENCOUNTER — Other Ambulatory Visit
Admission: RE | Admit: 2023-05-24 | Discharge: 2023-05-24 | Disposition: A | Discharge status: Home / Self Care | Payer: Medicare Other | Attending: Cardiovascular Disease | Admitting: Cardiovascular Disease

## 2023-05-24 DIAGNOSIS — I25118 Atherosclerotic heart disease of native coronary artery with other forms of angina pectoris: Secondary | ICD-10-CM | POA: Diagnosis not present

## 2023-05-24 DIAGNOSIS — I10 Essential (primary) hypertension: Secondary | ICD-10-CM | POA: Insufficient documentation

## 2023-05-24 LAB — CBC
HCT: 42.3 % (ref 39.0–52.0)
Hemoglobin: 14.3 g/dL (ref 13.0–17.0)
MCH: 31.6 pg (ref 26.0–34.0)
MCHC: 33.8 g/dL (ref 30.0–36.0)
MCV: 93.4 fL (ref 80.0–100.0)
Platelets: 218 10*3/uL (ref 150–400)
RBC: 4.53 MIL/uL (ref 4.22–5.81)
RDW: 13.3 % (ref 11.5–15.5)
WBC: 9.2 10*3/uL (ref 4.0–10.5)
nRBC: 0 % (ref 0.0–0.2)

## 2023-05-24 NOTE — Telephone Encounter (Signed)
The patient would like to schedule his right and left heart cath for 12/18 with [name] at [facility]. He will get labs completed at the [facility] today. STAT message has been sent to the precert pool.  Instructions have also been sent to the patient's MyChart.   [facility] A DEPT OF [facility] AT [facility] [address] [place] [place] [address] Dept: [phone] Loc: [phone]  Jeff Warren  05/24/2023  You are scheduled for a Cardiac Catheterization on Wednesday, December 18 with Dr. [name].  1. Please arrive at the [facility] (Main Entrance A) at [facility]: [address] [place], [place] [address] at 9:30 AM (This time is 2 hour(s) before your procedure to ensure your preparation).   Free valet parking service is available. You will check in at ADMITTING. The support person will be asked to wait in the waiting room.  It is OK to have someone drop you off and come back when you are ready to be discharged.    Special note: Every effort is made to have your procedure done on time. Please understand that emergencies sometimes delay scheduled procedures.  2. Diet: Do not eat solid foods after midnight.  The patient may have clear liquids until 5am upon the day of the procedure.  3. Labs: You will need to have blood drawn on Monday 12/16 at the [facility]. You do not need to be fasting.  4. Medication instructions in preparation for your procedure: Hold the Furosemide and the Potassium the morning of the cath.    On the morning of your procedure, take your Aspirin 81 mg and any morning medicines NOT listed above.  You may use sips of water.  5. Plan to go home the same day, you will only stay overnight if medically necessary. 6. Bring a current list of your medications and current insurance cards. 7. You MUST have a responsible person to drive you home. 8. Someone MUST  be with you the first 24 hours after you arrive home or your discharge will be delayed. 9. Please wear clothes that are easy to get on and off and wear slip-on shoes.  Thank you for allowing [place] to care for you!   -- [place] [facility]

## 2023-05-25 ENCOUNTER — Telehealth: Payer: Self-pay | Admitting: *Deleted

## 2023-05-25 NOTE — Telephone Encounter (Signed)
Cardiac Catheterization scheduled at [facility] for: Wednesday May 26, 2023 11:30 AM Arrival time [facility] Main Entrance A at: 9:30 AM  Nothing to eat after midnight prior to procedure, clear liquids until 5 AM day of procedure.  Medication instructions: -Hold:  Lasix/KCl-AM of procedure  -Other usual morning medications can be taken with sips of water including aspirin 81 mg.  Plan to go home the same day, you will only stay overnight if medically necessary.  You must have responsible adult to drive you home.  Someone must be with you the first 24 hours after you arrive home.  Reviewed procedure instructions with patient.

## 2023-05-26 ENCOUNTER — Encounter (HOSPITAL_COMMUNITY)
Admission: RE | Disposition: A | Discharge status: Home / Self Care | Payer: Self-pay | Source: Home / Self Care | Attending: Cardiovascular Disease

## 2023-05-26 ENCOUNTER — Encounter (HOSPITAL_COMMUNITY): Payer: Self-pay | Admitting: Cardiovascular Disease

## 2023-05-26 ENCOUNTER — Other Ambulatory Visit: Payer: Self-pay

## 2023-05-26 ENCOUNTER — Inpatient Hospital Stay (HOSPITAL_COMMUNITY)
Admission: RE | Admit: 2023-05-26 | Discharge: 2023-05-27 | DRG: 321 | Disposition: A | Discharge status: Home / Self Care | Payer: Medicare Other | Attending: Cardiovascular Disease | Admitting: Cardiovascular Disease

## 2023-05-26 DIAGNOSIS — M109 Gout, unspecified: Secondary | ICD-10-CM | POA: Diagnosis present

## 2023-05-26 DIAGNOSIS — N400 Enlarged prostate without lower urinary tract symptoms: Secondary | ICD-10-CM | POA: Diagnosis present

## 2023-05-26 DIAGNOSIS — T50906A Underdosing of unspecified drugs, medicaments and biological substances, initial encounter: Secondary | ICD-10-CM | POA: Diagnosis present

## 2023-05-26 DIAGNOSIS — I2510 Atherosclerotic heart disease of native coronary artery without angina pectoris: Secondary | ICD-10-CM | POA: Diagnosis present

## 2023-05-26 DIAGNOSIS — I371 Nonrheumatic pulmonary valve insufficiency: Secondary | ICD-10-CM | POA: Diagnosis present

## 2023-05-26 DIAGNOSIS — I444 Left anterior fascicular block: Secondary | ICD-10-CM | POA: Diagnosis present

## 2023-05-26 DIAGNOSIS — Z7982 Long term (current) use of aspirin: Secondary | ICD-10-CM | POA: Diagnosis not present

## 2023-05-26 DIAGNOSIS — Z91199 Patient's noncompliance with other medical treatment and regimen due to unspecified reason: Secondary | ICD-10-CM | POA: Diagnosis not present

## 2023-05-26 DIAGNOSIS — G4730 Sleep apnea, unspecified: Secondary | ICD-10-CM | POA: Diagnosis present

## 2023-05-26 DIAGNOSIS — I25118 Atherosclerotic heart disease of native coronary artery with other forms of angina pectoris: Secondary | ICD-10-CM

## 2023-05-26 DIAGNOSIS — T82855A Stenosis of coronary artery stent, initial encounter: Secondary | ICD-10-CM | POA: Diagnosis present

## 2023-05-26 DIAGNOSIS — Z91138 Patient's unintentional underdosing of medication regimen for other reason: Secondary | ICD-10-CM

## 2023-05-26 DIAGNOSIS — Z8249 Family history of ischemic heart disease and other diseases of the circulatory system: Secondary | ICD-10-CM

## 2023-05-26 DIAGNOSIS — I110 Hypertensive heart disease with heart failure: Principal | ICD-10-CM | POA: Diagnosis present

## 2023-05-26 DIAGNOSIS — Z23 Encounter for immunization: Secondary | ICD-10-CM

## 2023-05-26 DIAGNOSIS — Z888 Allergy status to other drugs, medicaments and biological substances status: Secondary | ICD-10-CM | POA: Diagnosis not present

## 2023-05-26 DIAGNOSIS — I252 Old myocardial infarction: Secondary | ICD-10-CM | POA: Diagnosis not present

## 2023-05-26 DIAGNOSIS — K219 Gastro-esophageal reflux disease without esophagitis: Secondary | ICD-10-CM | POA: Diagnosis present

## 2023-05-26 DIAGNOSIS — E782 Mixed hyperlipidemia: Secondary | ICD-10-CM | POA: Diagnosis present

## 2023-05-26 DIAGNOSIS — I25119 Atherosclerotic heart disease of native coronary artery with unspecified angina pectoris: Secondary | ICD-10-CM | POA: Diagnosis present

## 2023-05-26 DIAGNOSIS — I10 Essential (primary) hypertension: Secondary | ICD-10-CM | POA: Diagnosis present

## 2023-05-26 DIAGNOSIS — I272 Pulmonary hypertension, unspecified: Secondary | ICD-10-CM | POA: Diagnosis present

## 2023-05-26 DIAGNOSIS — Y831 Surgical operation with implant of artificial internal device as the cause of abnormal reaction of the patient, or of later complication, without mention of misadventure at the time of the procedure: Secondary | ICD-10-CM | POA: Diagnosis present

## 2023-05-26 DIAGNOSIS — R0600 Dyspnea, unspecified: Secondary | ICD-10-CM | POA: Diagnosis present

## 2023-05-26 DIAGNOSIS — Z79899 Other long term (current) drug therapy: Secondary | ICD-10-CM | POA: Diagnosis not present

## 2023-05-26 DIAGNOSIS — I5023 Acute on chronic systolic (congestive) heart failure: Secondary | ICD-10-CM | POA: Diagnosis present

## 2023-05-26 DIAGNOSIS — Z833 Family history of diabetes mellitus: Secondary | ICD-10-CM

## 2023-05-26 DIAGNOSIS — Z955 Presence of coronary angioplasty implant and graft: Secondary | ICD-10-CM

## 2023-05-26 DIAGNOSIS — I493 Ventricular premature depolarization: Secondary | ICD-10-CM | POA: Diagnosis not present

## 2023-05-26 DIAGNOSIS — I255 Ischemic cardiomyopathy: Secondary | ICD-10-CM | POA: Diagnosis present

## 2023-05-26 HISTORY — PX: CORONARY PRESSURE/FFR STUDY: CATH118243

## 2023-05-26 HISTORY — PX: RIGHT/LEFT HEART CATH AND CORONARY ANGIOGRAPHY: CATH118266

## 2023-05-26 HISTORY — PX: CORONARY BALLOON ANGIOPLASTY: CATH118233

## 2023-05-26 HISTORY — PX: CORONARY STENT INTERVENTION: CATH118234

## 2023-05-26 LAB — POCT I-STAT 7, (LYTES, BLD GAS, ICA,H+H)
Acid-Base Excess: 0 mmol/L (ref 0.0–2.0)
Bicarbonate: 23.7 mmol/L (ref 20.0–28.0)
Calcium, Ion: 1.15 mmol/L (ref 1.15–1.40)
HCT: 40 % (ref 39.0–52.0)
Hemoglobin: 13.6 g/dL (ref 13.0–17.0)
O2 Saturation: 96 %
Potassium: 3.9 mmol/L (ref 3.5–5.1)
Sodium: 142 mmol/L (ref 135–145)
TCO2: 25 mmol/L (ref 22–32)
pCO2 arterial: 36.4 mm[Hg] (ref 32–48)
pH, Arterial: 7.422 (ref 7.35–7.45)
pO2, Arterial: 81 mm[Hg] — ABNORMAL LOW (ref 83–108)

## 2023-05-26 LAB — POCT I-STAT EG7
Acid-Base Excess: 1 mmol/L (ref 0.0–2.0)
Acid-Base Excess: 2 mmol/L (ref 0.0–2.0)
Bicarbonate: 27 mmol/L (ref 20.0–28.0)
Bicarbonate: 27.9 mmol/L (ref 20.0–28.0)
Calcium, Ion: 1.17 mmol/L (ref 1.15–1.40)
Calcium, Ion: 1.19 mmol/L (ref 1.15–1.40)
HCT: 40 % (ref 39.0–52.0)
HCT: 41 % (ref 39.0–52.0)
Hemoglobin: 13.6 g/dL (ref 13.0–17.0)
Hemoglobin: 13.9 g/dL (ref 13.0–17.0)
O2 Saturation: 57 %
O2 Saturation: 59 %
Potassium: 4 mmol/L (ref 3.5–5.1)
Potassium: 4 mmol/L (ref 3.5–5.1)
Sodium: 142 mmol/L (ref 135–145)
Sodium: 143 mmol/L (ref 135–145)
TCO2: 28 mmol/L (ref 22–32)
TCO2: 29 mmol/L (ref 22–32)
pCO2, Ven: 47.5 mm[Hg] (ref 44–60)
pCO2, Ven: 47.6 mm[Hg] (ref 44–60)
pH, Ven: 7.363 (ref 7.25–7.43)
pH, Ven: 7.375 (ref 7.25–7.43)
pO2, Ven: 31 mm[Hg] — CL (ref 32–45)
pO2, Ven: 32 mm[Hg] (ref 32–45)

## 2023-05-26 LAB — CBC
HCT: 40.5 % (ref 39.0–52.0)
Hemoglobin: 13.5 g/dL (ref 13.0–17.0)
MCH: 30.2 pg (ref 26.0–34.0)
MCHC: 33.3 g/dL (ref 30.0–36.0)
MCV: 90.6 fL (ref 80.0–100.0)
Platelets: 195 10*3/uL (ref 150–400)
RBC: 4.47 MIL/uL (ref 4.22–5.81)
RDW: 13.2 % (ref 11.5–15.5)
WBC: 7 10*3/uL (ref 4.0–10.5)
nRBC: 0 % (ref 0.0–0.2)

## 2023-05-26 LAB — POCT ACTIVATED CLOTTING TIME
Activated Clotting Time: 141 s
Activated Clotting Time: 147 s
Activated Clotting Time: 262 s
Activated Clotting Time: 279 s

## 2023-05-26 LAB — CREATININE, SERUM
Creatinine, Ser: 1.3 mg/dL — ABNORMAL HIGH (ref 0.61–1.24)
GFR, Estimated: >60 mL/min (ref >60)

## 2023-05-26 SURGERY — RIGHT/LEFT HEART CATH AND CORONARY ANGIOGRAPHY
Anesthesia: LOCAL

## 2023-05-26 MED ORDER — NITROGLYCERIN 1 MG/10 ML FOR IR/CATH LAB
INTRA_ARTERIAL | Status: DC | PRN
  Administered 2023-05-26: 100 ug

## 2023-05-26 MED ORDER — ASPIRIN 81 MG PO TBEC
81.0000 mg | DELAYED_RELEASE_TABLET | Freq: Every day | ORAL | Status: DC
  Administered 2023-05-27: 81 mg via ORAL
  Filled 2023-05-26: qty 1

## 2023-05-26 MED ORDER — SODIUM CHLORIDE 0.9% FLUSH
3.0000 mL | Freq: Two times a day (BID) | INTRAVENOUS | Status: DC
  Administered 2023-05-26 – 2023-05-27 (×3): 3 mL via INTRAVENOUS

## 2023-05-26 MED ORDER — VERAPAMIL HCL 2.5 MG/ML IV SOLN
INTRAVENOUS | Status: DC | PRN
  Administered 2023-05-26: 10 mL via INTRA_ARTERIAL

## 2023-05-26 MED ORDER — LIDOCAINE HCL (PF) 1 % IJ SOLN
INTRAMUSCULAR | Status: DC | PRN
  Administered 2023-05-26 (×2): 2 mL

## 2023-05-26 MED ORDER — CLOPIDOGREL BISULFATE 300 MG PO TABS
ORAL_TABLET | ORAL | Status: DC | PRN
  Administered 2023-05-26: 600 mg via ORAL

## 2023-05-26 MED ORDER — ACETAMINOPHEN 325 MG PO TABS: 650.0000 mg | ORAL_TABLET | ORAL | Status: DC | PRN

## 2023-05-26 MED ORDER — IOHEXOL 350 MG/ML SOLN
INTRAVENOUS | Status: DC | PRN
  Administered 2023-05-26: 150 mL

## 2023-05-26 MED ORDER — SODIUM CHLORIDE 0.9 % IV SOLN: INTRAVENOUS | Status: DC

## 2023-05-26 MED ORDER — CLOPIDOGREL BISULFATE 75 MG PO TABS
75.0000 mg | ORAL_TABLET | Freq: Every day | ORAL | Status: DC
  Administered 2023-05-27: 75 mg via ORAL
  Filled 2023-05-26: qty 1

## 2023-05-26 MED ORDER — HEPARIN (PORCINE) IN NACL 1000-0.9 UT/500ML-% IV SOLN
INTRAVENOUS | Status: DC | PRN
  Administered 2023-05-26 (×2): 500 mL

## 2023-05-26 MED ORDER — POTASSIUM CHLORIDE CRYS ER 20 MEQ PO TBCR
20.0000 meq | EXTENDED_RELEASE_TABLET | Freq: Every day | ORAL | Status: DC
  Administered 2023-05-26 – 2023-05-27 (×2): 20 meq via ORAL
  Filled 2023-05-26 (×2): qty 1

## 2023-05-26 MED ORDER — FUROSEMIDE 10 MG/ML IJ SOLN
20.0000 mg | Freq: Two times a day (BID) | INTRAMUSCULAR | Status: DC
  Administered 2023-05-26 – 2023-05-27 (×2): 20 mg via INTRAVENOUS
  Filled 2023-05-26 (×2): qty 2

## 2023-05-26 MED ORDER — VERAPAMIL HCL 2.5 MG/ML IV SOLN
INTRAVENOUS | Status: AC
  Filled 2023-05-26: qty 2

## 2023-05-26 MED ORDER — MIDAZOLAM HCL 2 MG/2ML IJ SOLN
INTRAMUSCULAR | Status: AC
  Filled 2023-05-26: qty 2

## 2023-05-26 MED ORDER — MIDAZOLAM HCL 2 MG/2ML IJ SOLN
INTRAMUSCULAR | Status: DC | PRN
  Administered 2023-05-26: 1 mg via INTRAVENOUS

## 2023-05-26 MED ORDER — SODIUM CHLORIDE 0.9% FLUSH
3.0000 mL | INTRAVENOUS | Status: DC | PRN
Start: 2023-05-26 — End: 2023-05-27

## 2023-05-26 MED ORDER — NITROGLYCERIN 1 MG/10 ML FOR IR/CATH LAB
INTRA_ARTERIAL | Status: AC
  Filled 2023-05-26: qty 10

## 2023-05-26 MED ORDER — CARVEDILOL 6.25 MG PO TABS
6.2500 mg | ORAL_TABLET | Freq: Two times a day (BID) | ORAL | Status: DC
  Administered 2023-05-26 – 2023-05-27 (×2): 6.25 mg via ORAL
  Filled 2023-05-26 (×2): qty 1

## 2023-05-26 MED ORDER — HEPARIN SODIUM (PORCINE) 1000 UNIT/ML IJ SOLN
INTRAMUSCULAR | Status: AC
  Filled 2023-05-26: qty 10

## 2023-05-26 MED ORDER — ASPIRIN 81 MG PO CHEW: 81.0000 mg | CHEWABLE_TABLET | ORAL | Status: DC

## 2023-05-26 MED ORDER — INFLUENZA VAC A&B SURF ANT ADJ 0.5 ML IM SUSY
0.5000 mL | PREFILLED_SYRINGE | INTRAMUSCULAR | Status: AC
  Administered 2023-05-27: 0.5 mL via INTRAMUSCULAR
  Filled 2023-05-26: qty 0.5

## 2023-05-26 MED ORDER — PNEUMOCOCCAL 20-VAL CONJ VACC 0.5 ML IM SUSY
0.5000 mL | PREFILLED_SYRINGE | INTRAMUSCULAR | Status: AC
  Administered 2023-05-27: 0.5 mL via INTRAMUSCULAR
  Filled 2023-05-26: qty 0.5

## 2023-05-26 MED ORDER — FENTANYL CITRATE (PF) 100 MCG/2ML IJ SOLN
INTRAMUSCULAR | Status: DC | PRN
  Administered 2023-05-26: 50 ug via INTRAVENOUS

## 2023-05-26 MED ORDER — HEPARIN SODIUM (PORCINE) 1000 UNIT/ML IJ SOLN
INTRAMUSCULAR | Status: DC | PRN
  Administered 2023-05-26 (×2): 5000 [IU] via INTRAVENOUS
  Administered 2023-05-26: 3000 [IU] via INTRAVENOUS

## 2023-05-26 MED ORDER — SODIUM CHLORIDE 0.9 % IV SOLN
250.0000 mL | INTRAVENOUS | Status: DC | PRN
Start: 2023-05-26 — End: 2023-05-27

## 2023-05-26 MED ORDER — ONDANSETRON HCL 4 MG/2ML IJ SOLN
4.0000 mg | Freq: Four times a day (QID) | INTRAMUSCULAR | Status: DC | PRN
Start: 2023-05-26 — End: 2023-05-27

## 2023-05-26 MED ORDER — VITAMIN D3 25 MCG (1000 UNIT) PO TABS
5000.0000 [IU] | ORAL_TABLET | Freq: Every day | ORAL | Status: DC
  Administered 2023-05-27: 5000 [IU] via ORAL
  Filled 2023-05-26 (×2): qty 5

## 2023-05-26 MED ORDER — FENTANYL CITRATE (PF) 100 MCG/2ML IJ SOLN
INTRAMUSCULAR | Status: AC
  Filled 2023-05-26: qty 2

## 2023-05-26 MED ORDER — FLUTICASONE PROPIONATE 50 MCG/ACT NA SUSP
1.0000 | Freq: Every day | NASAL | Status: DC
  Filled 2023-05-26: qty 16

## 2023-05-26 MED ORDER — ENOXAPARIN SODIUM 40 MG/0.4ML IJ SOSY
40.0000 mg | PREFILLED_SYRINGE | INTRAMUSCULAR | Status: DC
  Administered 2023-05-27: 40 mg via SUBCUTANEOUS
  Filled 2023-05-26: qty 0.4

## 2023-05-26 MED ORDER — ROSUVASTATIN CALCIUM 20 MG PO TABS
20.0000 mg | ORAL_TABLET | Freq: Every day | ORAL | Status: DC
  Administered 2023-05-27: 20 mg via ORAL
  Filled 2023-05-26: qty 1

## 2023-05-26 MED ORDER — CLOPIDOGREL BISULFATE 300 MG PO TABS
ORAL_TABLET | ORAL | Status: AC
  Filled 2023-05-26: qty 2

## 2023-05-26 MED ORDER — SACUBITRIL-VALSARTAN 24-26 MG PO TABS
1.0000 | ORAL_TABLET | Freq: Two times a day (BID) | ORAL | Status: DC
  Administered 2023-05-26 (×2): 1 via ORAL
  Filled 2023-05-26 (×3): qty 1

## 2023-05-26 MED ORDER — LIDOCAINE HCL (PF) 1 % IJ SOLN
INTRAMUSCULAR | Status: AC
  Filled 2023-05-26: qty 30

## 2023-05-26 SURGICAL SUPPLY — 23 items
BALL SAPPHIRE NC24 3.25X18 (BALLOONS) ×1
BALLN SAPPHIRE 2.5X12 (BALLOONS) ×1
BALLOON SAPPHIRE 2.5X12 (BALLOONS) IMPLANT
BALLOON SAPPHIRE NC24 3.25X18 (BALLOONS) IMPLANT
CATH BALLN WEDGE 5F 110CM (CATHETERS) IMPLANT
CATH INFINITI 5 FR JL3.5 (CATHETERS) IMPLANT
CATH INFINITI AMBI 5FR JK (CATHETERS) IMPLANT
CATH LAUNCHER 6FR EBU3.5 (CATHETERS) IMPLANT
CATH VISTA GUIDE 6FR XBLAD3.5 (CATHETERS) IMPLANT
DEVICE RAD COMP TR BAND LRG (VASCULAR PRODUCTS) IMPLANT
GLIDESHEATH SLEND SS 6F .021 (SHEATH) IMPLANT
GUIDEWIRE INQWIRE 1.5J.035X260 (WIRE) IMPLANT
GUIDEWIRE PRESSURE X 175 (WIRE) IMPLANT
INQWIRE 1.5J .035X260CM (WIRE) ×1
KIT ENCORE 26 ADVANTAGE (KITS) IMPLANT
KIT ESSENTIALS PG (KITS) IMPLANT
PACK CARDIAC CATHETERIZATION (CUSTOM PROCEDURE TRAY) ×2 IMPLANT
SET ATX-X65L (MISCELLANEOUS) IMPLANT
SHEATH GLIDE SLENDER 4/5FR (SHEATH) IMPLANT
STENT SYNERGY XD 3.0X24 (Permanent Stent) IMPLANT
SYNERGY XD 3.0X24 (Permanent Stent) ×1 IMPLANT
TUBING CIL FLEX 10 FLL-RA (TUBING) IMPLANT
WIRE RUNTHROUGH .014X180CM (WIRE) IMPLANT

## 2023-05-26 NOTE — Plan of Care (Signed)

## 2023-05-26 NOTE — Interval H&P Note (Signed)
History and Physical Interval Note:  05/26/2023 11:57 AM  Jeff Warren  has presented today for surgery, with the diagnosis of heart failure - chest pain.  The various methods of treatment have been discussed with the patient and family. After consideration of risks, benefits and other options for treatment, the patient has consented to  Procedure(s): RIGHT/LEFT HEART CATH AND CORONARY ANGIOGRAPHY (N/A) as a surgical intervention.  The patient's history has been reviewed, patient examined, no change in status, stable for surgery.  I have reviewed the patient's chart and labs.  Questions were answered to the patient's satisfaction.     [name]

## 2023-05-27 ENCOUNTER — Ambulatory Visit: Payer: Medicare Other

## 2023-05-27 ENCOUNTER — Other Ambulatory Visit (HOSPITAL_COMMUNITY): Payer: Self-pay

## 2023-05-27 ENCOUNTER — Telehealth (HOSPITAL_COMMUNITY): Payer: Self-pay

## 2023-05-27 ENCOUNTER — Encounter (HOSPITAL_COMMUNITY): Payer: Self-pay | Admitting: Cardiovascular Disease

## 2023-05-27 DIAGNOSIS — I5023 Acute on chronic systolic (congestive) heart failure: Secondary | ICD-10-CM

## 2023-05-27 DIAGNOSIS — G4730 Sleep apnea, unspecified: Secondary | ICD-10-CM | POA: Diagnosis present

## 2023-05-27 LAB — BASIC METABOLIC PANEL
Anion gap: 7 (ref 5–15)
BUN: 21 mg/dL (ref 8–23)
CO2: 28 mmol/L (ref 22–32)
Calcium: 8.6 mg/dL — ABNORMAL LOW (ref 8.9–10.3)
Chloride: 105 mmol/L (ref 98–111)
Creatinine, Ser: 1.24 mg/dL (ref 0.61–1.24)
GFR, Estimated: >60 mL/min (ref >60)
Glucose, Bld: 93 mg/dL (ref 70–99)
Potassium: 4.2 mmol/L (ref 3.5–5.1)
Sodium: 140 mmol/L (ref 135–145)

## 2023-05-27 LAB — CBC
HCT: 46 % (ref 39.0–52.0)
Hemoglobin: 15.2 g/dL (ref 13.0–17.0)
MCH: 30.3 pg (ref 26.0–34.0)
MCHC: 33 g/dL (ref 30.0–36.0)
MCV: 91.6 fL (ref 80.0–100.0)
Platelets: 211 10*3/uL (ref 150–400)
RBC: 5.02 MIL/uL (ref 4.22–5.81)
RDW: 13.4 % (ref 11.5–15.5)
WBC: 9.1 10*3/uL (ref 4.0–10.5)
nRBC: 0 % (ref 0.0–0.2)

## 2023-05-27 MED ORDER — PANTOPRAZOLE SODIUM 40 MG PO TBEC
40.0000 mg | DELAYED_RELEASE_TABLET | Freq: Every day | ORAL | 1 refills | Status: DC
  Filled 2023-05-27: qty 30, 30d supply, fill #0

## 2023-05-27 MED ORDER — EMPAGLIFLOZIN 10 MG PO TABS
10.0000 mg | ORAL_TABLET | Freq: Every day | ORAL | Status: DC
  Administered 2023-05-27: 10 mg via ORAL
  Filled 2023-05-27: qty 1

## 2023-05-27 MED ORDER — EMPAGLIFLOZIN 10 MG PO TABS
10.0000 mg | ORAL_TABLET | Freq: Every day | ORAL | 1 refills | Status: DC
  Filled 2023-05-27: qty 30, 30d supply, fill #0

## 2023-05-27 MED ORDER — CLOPIDOGREL BISULFATE 75 MG PO TABS
75.0000 mg | ORAL_TABLET | Freq: Every day | ORAL | 2 refills | Status: DC
  Filled 2023-05-27: qty 30, 30d supply, fill #0

## 2023-05-27 MED ORDER — SACUBITRIL-VALSARTAN 49-51 MG PO TABS
1.0000 | ORAL_TABLET | Freq: Two times a day (BID) | ORAL | 1 refills | Status: DC
  Filled 2023-05-27: qty 60, 30d supply, fill #0

## 2023-05-27 MED ORDER — FUROSEMIDE 20 MG PO TABS
20.0000 mg | ORAL_TABLET | Freq: Two times a day (BID) | ORAL | 1 refills | Status: DC
  Filled 2023-05-27: qty 60, 30d supply, fill #0

## 2023-05-27 MED ORDER — NITROGLYCERIN 0.4 MG SL SUBL
0.4000 mg | SUBLINGUAL_TABLET | SUBLINGUAL | 2 refills | Status: AC | PRN
  Filled 2023-05-27: qty 25, 7d supply, fill #0

## 2023-05-27 MED ORDER — SACUBITRIL-VALSARTAN 49-51 MG PO TABS
1.0000 | ORAL_TABLET | Freq: Two times a day (BID) | ORAL | Status: DC
  Administered 2023-05-27: 1 via ORAL
  Filled 2023-05-27: qty 1

## 2023-05-27 NOTE — Telephone Encounter (Signed)
Heart Failure Patient Advocate Encounter  Medication assistance forms for Jeff Warren have been started. Patient has signed forms.  Provider information will need to be completed before submitting.  Forms have been attached to patient chart under 'Media' tab. Routing to appropriate office. Please reach out to me with any questions or concerns.  Burnell Blanks, CPhT Rx Patient Advocate Phone: [phone]

## 2023-05-27 NOTE — Discharge Summary (Addendum)
Discharge Summary    Patient ID: Jeff Warren MRN: [id number]; DOB: Oct 07, 1955  Admit date: 05/26/2023 Discharge date: 05/27/2023  PCP:  Patient, No Pcp Per   [facility] Providers Cardiologist:  [name], MD   {  Discharge Diagnoses    Principal Problem:   Acute on chronic systolic (congestive) heart failure [facility]) Active Problems:   CAD in native artery   Essential hypertension   Mixed hyperlipidemia   Coronary artery disease   Sleep apnea  Diagnostic Studies/Procedures    ECHO (05/19/2023)  IMPRESSIONS   1. Left ventricular ejection fraction, by estimation, is 30 to 35%. Left  ventricular ejection fraction by PLAX is 31 %. The left ventricle has  moderately decreased function. The left ventricle demonstrates global  hypokinesis. The left ventricular  internal cavity size was moderately dilated. There is moderate left  ventricular hypertrophy. Left ventricular diastolic parameters are  indeterminate.   2. Right ventricular systolic function is moderately reduced. The right  ventricular size is normal. There is moderately elevated pulmonary artery  systolic pressure. The estimated right ventricular systolic pressure is  45.3 mmHg.   3. Left atrial size was severely dilated.   4. The mitral valve is normal in structure. Mild to moderate mitral valve  regurgitation. No evidence of mitral stenosis.   5. The aortic valve is tricuspid. Aortic valve regurgitation is mild. No  aortic stenosis is present.   6. There is borderline dilatation of the aortic root, measuring 37 mm.   7. The inferior vena cava is dilated in size with >50% respiratory  variability, suggesting right atrial pressure of 8 mmHg.   Comparison(s): Previous LVEF 45-50%.   FINDINGS   Left Ventricle: Left ventricular ejection fraction, by estimation, is 30  to 35%. Left ventricular ejection fraction by PLAX is 31 %. The left  ventricle has moderately decreased function. The left  ventricle  demonstrates global hypokinesis. Definity  contrast agent was given IV to delineate the left ventricular endocardial  borders. The left ventricular internal cavity size was moderately dilated.  There is moderate left ventricular hypertrophy. Left ventricular diastolic  parameters are indeterminate.   Right Ventricle: The right ventricular size is normal. No increase in  right ventricular wall thickness. Right ventricular systolic function is  moderately reduced. There is moderately elevated pulmonary artery systolic  pressure. The tricuspid regurgitant  velocity is 2.97 m/s, and with an assumed right atrial pressure of 10  mmHg, the estimated right ventricular systolic pressure is 45.3 mmHg.   Left Atrium: Left atrial size was severely dilated.   Right Atrium: Right atrial size was normal in size.   Pericardium: There is no evidence of pericardial effusion.   Mitral Valve: The mitral valve is normal in structure. Mild to moderate  mitral valve regurgitation. No evidence of mitral valve stenosis.   Tricuspid Valve: The tricuspid valve is normal in structure. Tricuspid  valve regurgitation is mild . No evidence of tricuspid stenosis.   Aortic Valve: The aortic valve is tricuspid. Aortic valve regurgitation is  mild. No aortic stenosis is present. Aortic valve mean gradient measures  2.0 mmHg. Aortic valve peak gradient measures 3.4 mmHg. Aortic valve area,  by VTI measures 2.87 cm.   Pulmonic Valve: The pulmonic valve was normal in structure. Pulmonic valve  regurgitation is mild to moderate. No evidence of pulmonic stenosis.   Aorta: The aortic root is normal in size and structure. There is  borderline dilatation of the aortic root, measuring 37  mm.   Venous: The inferior vena cava is dilated in size with greater than 50%  respiratory variability, suggesting right atrial pressure of 8 mmHg.   IAS/Shunts: No atrial level shunt detected by color flow Doppler.    CARDIAC CATHETERIZATION (05/26/2023)  Diagnostic Dominance: Right Left Main  Vessel is angiographically normal.  Ost LM lesion is 20% stenosed.    Left Anterior Descending  Mid LAD lesion is 70% stenosed. The lesion was previously treated using a drug eluting stent over 2 years ago. Previously placed stent displays restenosis. Pressure gradient was performed on the lesion using a GUIDEWIRE PRESSURE X 175 and CATH VISTA GUIDE 6FR XBLAD3.5. RFR: 0.83.    First Diagonal Branch  Collaterals  1st Diag filled by collaterals from RPDA.    1st Diag lesion is 100% stenosed.    Second Diagonal Branch  Vessel is angiographically normal.    Third Diagonal Branch  Vessel is small in size. Vessel is angiographically normal.    Ramus Intermedius  Ramus lesion is 95% stenosed. The lesion is type C.    Left Circumflex  Vessel is small. The vessel exhibits minimal luminal irregularities.    First Obtuse Marginal Branch  The vessel exhibits minimal luminal irregularities.    Second Obtuse Marginal Branch  Vessel is small in size. Vessel is angiographically normal.    Third Obtuse Marginal Branch  Vessel is angiographically normal.    Right Coronary Artery  There is mild diffuse disease throughout the vessel.  Mid RCA to Dist RCA lesion is 40% stenosed.  Dist RCA lesion is 40% stenosed. The lesion was previously treated using a drug eluting stent over 2 years ago. Previously placed stent displays restenosis.    Right Posterior Descending Artery  Vessel is angiographically normal.    Right Posterior Atrioventricular Artery  Vessel is angiographically normal.    Intervention   Mid LAD lesion  Angioplasty  Balloon angioplasty was performed using a BALL SAPPHIRE NC24 3.25X18. Maximum pressure: 16 atm. Inflation time: 30 sec.  Post-Intervention Lesion Assessment  The intervention was successful. Pre-interventional TIMI flow is 3. Post-intervention TIMI flow is 3. No complications  occurred at this lesion.  There is a 0% residual stenosis post intervention.    Ramus lesion  Stent  Lesion length: 20 mm. Lesion crossed with guidewire using a WIRE RUNTHROUGH .[id number]. Pre-stent angioplasty was performed using a BALLN SAPPHIRE 2.5X12. Maximum pressure: 10 atm. Inflation time: 20 sec. A drug-eluting stent was successfully placed using a SYNERGY XD 3.0X24. Maximum pressure: 14 atm. Inflation time: 20 sec. Post-stent angioplasty was performed using a BALL SAPPHIRE NC24 3.25X18. Maximum pressure: 16 atm. Inflation time: 20 sec.  Post-Intervention Lesion Assessment  The intervention was successful. Pre-interventional TIMI flow is 3. Post-intervention TIMI flow is 3. No complications occurred at this lesion.  There is a 0% residual stenosis post intervention.    _____________   History of Present Illness     Jeff Warren is a 67 y.o. male with coronary artery disease, prior inferior wall STEMI (11/2016), acute on chronic systolic heart failure (most recent LVEF 30-35%), hypertension, sleep apnea who had a history of being noncompliant with medications. Patient was seen in office 04/29/2023 with complaints of shortness of breath and chest discomfort where [name] decided to proceed with further evaluation. He underwent a right/left heart catheterization on 05/26/2023.   Hospital Course     Cath showed significant three-vessel CAD with patent distal RCA stent with moderate in-stent restenosis, patent LAD stent  with significant in-stent restenosis with an RFR ratio of 0.83 and new severe stenosis in a large proximal ramus. Right heart cath showed severely elevated RA pressure, severe pulmonary hypertension, moderately severely elevated wedge pressure and severely reduced cardiac output. LVEDP was severely elevated but in the setting of frequent PVCs. There was successful balloon angioplasty of mid LAD in-stent restenosis and drug-eluting stent placement to the proximal ramus.  Patient  recovered well from the procedure, was able to ambulate in the hallway with cardiac rehab without any complications.Discussed with patient the importance of compliance with medical management moving forward. Started Sherryll Burger and [place]. Increased Lasix to 20 mg BID at discharge. Continue aspirin, Plavix, Coreg, and Crestor after discharge. Consider adding spirolactone in the future.  Reminded patient of the importance of monitoring symptoms, taking BP, recording daily weights, taking medications as directed, and continuing to follow up with providers.   Patient discussed being previous diagnosed with sleep apnea and not being able to tolerate his CPAP. Instructed patient to revisit the discussion of treatment options for sleep apnea and he agrees to see sleep medicine to determine future treatment.   Did the patient have an acute coronary syndrome (MI, NSTEMI, STEMI, etc) this admission?:  No                               Did the patient have a percutaneous coronary intervention (stent / angioplasty)?:  Yes.    Cath/PCI Registry Performance & Quality Measures: Aspirin prescribed? - Yes ADP Receptor Inhibitor (Plavix/Clopidogrel, Brilinta/Ticagrelor or Effient/Prasugrel) prescribed (includes medically managed patients)? - Yes High Intensity Statin (Lipitor 40-80mg  or Crestor 20-40mg ) prescribed? - Yes For EF <40%, was ACEI/ARB prescribed? - Yes,  For EF <40%, Aldosterone Antagonist (Spironolactone or Eplerenone) prescribed? - No - Reason:  consider at office f/u Cardiac Rehab Phase II ordered? - Yes     The patient will be scheduled for a TOC follow up appointment in 10-14 days.  A message has been sent to the [facility] and Scheduling Pool at the office where the patient should be seen for follow up.  _____________  Discharge Vitals Blood pressure (!) 155/99, pulse 75, temperature 98.3 F (36.8 C), temperature source Oral, resp. rate 18, height 6\' 1"  (1.854 m), weight 116.3 kg, SpO2 99%.   Filed Weights   05/26/23 0936 05/26/23 1427  Weight: 117.9 kg 116.3 kg    Labs & Radiologic Studies    CBC Recent Labs    05/26/23 1447 05/27/23 0348  WBC 7.0 9.1  HGB 13.5 15.2  HCT 40.5 46.0  MCV 90.6 91.6  PLT 195 211   Basic Metabolic Panel Recent Labs    17/51/02 1224 05/26/23 1447 05/27/23 0348  NA 143  142  --  140  K 4.0  3.9  --  4.2  CL  --   --  105  CO2  --   --  28  GLUCOSE  --   --  93  BUN  --   --  21  CREATININE  --  1.30* 1.24  CALCIUM  --   --  8.6*   Liver Function Tests No results for input(s): "AST", "ALT", "ALKPHOS", "BILITOT", "PROT", "ALBUMIN" in the last 72 hours. No results for input(s): "LIPASE", "AMYLASE" in the last 72 hours. High Sensitivity Troponin:   No results for input(s): "TROPONINIHS" in the last 720 hours.  BNP Invalid input(s): "POCBNP" D-Dimer No results for input(s): "DDIMER"  in the last 72 hours. Hemoglobin A1C No results for input(s): "HGBA1C" in the last 72 hours. Fasting Lipid Panel No results for input(s): "CHOL", "HDL", "LDLCALC", "TRIG", "CHOLHDL", "LDLDIRECT" in the last 72 hours. Thyroid Function Tests No results for input(s): "TSH", "T4TOTAL", "T3FREE", "THYROIDAB" in the last 72 hours.  Invalid input(s): "FREET3" _____________  CARDIAC CATHETERIZATION Result Date: 05/26/2023   1st Diag lesion is 100% stenosed.   Mid LAD lesion is 70% stenosed.   Ost LM lesion is 20% stenosed.   Mid RCA to Dist RCA lesion is 40% stenosed.   Dist RCA lesion is 40% stenosed.   Ramus lesion is 95% stenosed.   A drug-eluting stent was successfully placed using a SYNERGY XD 3.0X24.   Balloon angioplasty was performed using a BALL SAPPHIRE NC24 3.25X18.   Post intervention, there is a 0% residual stenosis.   Post intervention, there is a 0% residual stenosis. 1.  Significant underlying three-vessel coronary artery disease with patent distal RCA stent with moderate in-stent restenosis, patent LAD stent with significant in-stent  restenosis with an RFR ratio of 0.83 and new severe stenosis in a large proximal ramus. 2.  Left ventricular angiography was not performed.  EF was 30 to 35% by echo. 3.  Right heart catheterization showed severely elevated RA pressure, severe pulmonary hypertension, moderately severely elevated wedge pressure and severely reduced cardiac output.  LVEDP was severely elevated but in the setting of frequent PVCs. 4.  Successful balloon angioplasty of the mid LAD in-stent restenosis and drug-eluting stent placement to the proximal ramus. Recommendations: Dual antiplatelet therapy for at least 6 months and preferably indefinitely. Aggressive treatment of risk factors. Will admit the patient for intravenous diuresis and optimizing his heart failure.  I switched losartan to Entresto. Consider adding spironolactone and an SGLT2 inhibitor before hospital discharge.   ECHOCARDIOGRAM COMPLETE Result Date: 05/20/2023    ECHOCARDIOGRAM REPORT   Patient Name:   Jeff Warren Date of Exam: 05/19/2023 Medical Rec #:  [id number]  Height:       72.0 in Accession #:    [id number] Weight:       259.1 lb Date of Birth:  [birth year]  BSA:          2.378 m Patient Age:    67 years   BP:           160/100 mmHg Patient Gender: M          HR:           78 bpm. Exam Location:  [place] Procedure: 2D Echo, 3D Echo, Cardiac Doppler, Color Doppler and Intracardiac            Opacification Agent Indications:    R06.02 SOB  History:        Patient has prior history of Echocardiogram examinations, most                 recent 02/26/2021. CAD and Previous Myocardial Infarction,                 Signs/Symptoms:Shortness of Breath; Risk Factors:Hypertension,                 Dyslipidemia and Non-Smoker.  Sonographer:    Quentin Ore RDMS, RVT, RDCS Referring Phys: [id number] MUHAMMAD A ARIDA IMPRESSIONS  1. Left ventricular ejection fraction, by estimation, is 30 to 35%. Left ventricular ejection fraction by PLAX is 31 %. The left ventricle has moderately  decreased function. The left ventricle demonstrates global hypokinesis. The left  ventricular internal cavity size was moderately dilated. There is moderate left ventricular hypertrophy. Left ventricular diastolic parameters are indeterminate.  2. Right ventricular systolic function is moderately reduced. The right ventricular size is normal. There is moderately elevated pulmonary artery systolic pressure. The estimated right ventricular systolic pressure is 45.3 mmHg.  3. Left atrial size was severely dilated.  4. The mitral valve is normal in structure. Mild to moderate mitral valve regurgitation. No evidence of mitral stenosis.  5. The aortic valve is tricuspid. Aortic valve regurgitation is mild. No aortic stenosis is present.  6. There is borderline dilatation of the aortic root, measuring 37 mm.  7. The inferior vena cava is dilated in size with >50% respiratory variability, suggesting right atrial pressure of 8 mmHg. Comparison(s): Previous LVEF 45-50%. FINDINGS  Left Ventricle: Left ventricular ejection fraction, by estimation, is 30 to 35%. Left ventricular ejection fraction by PLAX is 31 %. The left ventricle has moderately decreased function. The left ventricle demonstrates global hypokinesis. Definity contrast agent was given IV to delineate the left ventricular endocardial borders. The left ventricular internal cavity size was moderately dilated. There is moderate left ventricular hypertrophy. Left ventricular diastolic parameters are indeterminate. Right Ventricle: The right ventricular size is normal. No increase in right ventricular wall thickness. Right ventricular systolic function is moderately reduced. There is moderately elevated pulmonary artery systolic pressure. The tricuspid regurgitant velocity is 2.97 m/s, and with an assumed right atrial pressure of 10 mmHg, the estimated right ventricular systolic pressure is 45.3 mmHg. Left Atrium: Left atrial size was severely dilated. Right Atrium:  Right atrial size was normal in size. Pericardium: There is no evidence of pericardial effusion. Mitral Valve: The mitral valve is normal in structure. Mild to moderate mitral valve regurgitation. No evidence of mitral valve stenosis. Tricuspid Valve: The tricuspid valve is normal in structure. Tricuspid valve regurgitation is mild . No evidence of tricuspid stenosis. Aortic Valve: The aortic valve is tricuspid. Aortic valve regurgitation is mild. No aortic stenosis is present. Aortic valve mean gradient measures 2.0 mmHg. Aortic valve peak gradient measures 3.4 mmHg. Aortic valve area, by VTI measures 2.87 cm. Pulmonic Valve: The pulmonic valve was normal in structure. Pulmonic valve regurgitation is mild to moderate. No evidence of pulmonic stenosis. Aorta: The aortic root is normal in size and structure. There is borderline dilatation of the aortic root, measuring 37 mm. Venous: The inferior vena cava is dilated in size with greater than 50% respiratory variability, suggesting right atrial pressure of 8 mmHg. IAS/Shunts: No atrial level shunt detected by color flow Doppler.  LEFT VENTRICLE PLAX 2D LV EF:         Left            Diastology                ventricular     LV e' medial:    4.79 cm/s                ejection        LV E/e' medial:  22.5                fraction by     LV e' lateral:   8.27 cm/s                PLAX is 31      LV E/e' lateral: 13.1                %. LVIDd:  5.50 cm LVIDs:         4.70 cm LV PW:         1.70 cm LV IVS:        1.80 cm         3D Volume EF: LVOT diam:     2.10 cm         3D EF:        29 % LV SV:         50              LV EDV:       228 ml LV SV Index:   21              LV ESV:       163 ml LVOT Area:     3.46 cm        LV SV:        65 ml  LV Volumes (MOD) LV vol d, MOD    218.0 ml A2C: LV vol d, MOD    174.0 ml A4C: LV vol s, MOD    163.0 ml A2C: LV vol s, MOD    123.0 ml A4C: LV SV MOD A2C:   55.0 ml LV SV MOD A4C:   174.0 ml LV SV MOD BP:    63.3 ml RIGHT  VENTRICLE            IVC RV Basal diam:  4.60 cm    IVC diam: 2.20 cm RV Mid diam:    4.30 cm RV S prime:     8.27 cm/s TAPSE (M-mode): 1.3 cm LEFT ATRIUM              Index        RIGHT ATRIUM           Index LA diam:        6.70 cm  2.82 cm/m   RA Area:     16.40 cm LA Vol (A2C):   163.0 ml 68.53 ml/m  RA Volume:   43.50 ml  18.29 ml/m LA Vol (A4C):   139.0 ml 58.44 ml/m LA Biplane Vol: 152.0 ml 63.91 ml/m  AORTIC VALVE                    PULMONIC VALVE AV Area (Vmax):    3.01 cm     PV Vmax:          0.69 m/s AV Area (Vmean):   2.65 cm     PV Peak grad:     1.9 mmHg AV Area (VTI):     2.87 cm     PR End Diast Vel: 18.92 msec AV Vmax:           92.40 cm/s AV Vmean:          63.700 cm/s AV VTI:            0.174 m AV Peak Grad:      3.4 mmHg AV Mean Grad:      2.0 mmHg LVOT Vmax:         80.20 cm/s LVOT Vmean:        48.700 cm/s LVOT VTI:          0.144 m LVOT/AV VTI ratio: 0.83  AORTA Ao Root diam: 3.70 cm Ao Asc diam:  3.50 cm Ao Arch diam: 3.0 cm MITRAL VALVE  TRICUSPID VALVE MV Area (PHT): 7.16 cm     TR Peak grad:   35.3 mmHg MV Decel Time: 106 msec     TR Vmax:        297.00 cm/s MV E velocity: 108.00 cm/s MV A velocity: 36.20 cm/s   SHUNTS MV E/A ratio:  2.98         Systemic VTI:  0.14 m                             Systemic Diam: 2.10 cm Julien Nordmann MD Electronically signed by Julien Nordmann MD Signature Date/Time: 05/20/2023/6:17:52 PM    Final    Disposition   Pt is being discharged home today in good condition.  Follow-up Plans & Appointments     Follow-up Information     [facility]. Go in 12 day(s).   Specialty: Cardiology Why: Hospital follow up 06/08/2023 @ 4 pm PLEASE bring a current medication list to appointment Contact information: [address] [place] [address] [phone]               Discharge Instructions     Amb Referral to Cardiac Rehabilitation   Complete by:  As directed    Diagnosis: Coronary Stents   After initial evaluation and assessments completed: Virtual Based Care may be provided alone or in conjunction with Phase 2 Cardiac Rehab based on patient barriers.: Yes   Intensive Cardiac Rehabilitation (ICR) [facility] location only OR Traditional Cardiac Rehabilitation (TCR) *If criteria for ICR are not met will enroll in TCR [facility] only): Yes   Call MD for:  difficulty breathing, headache or visual disturbances   Complete by: As directed    Call MD for:  persistant dizziness or light-headedness   Complete by: As directed    Call MD for:  redness, tenderness, or signs of infection (pain, swelling, redness, odor or green/yellow discharge around incision site)   Complete by: As directed    Diet - low sodium heart healthy   Complete by: As directed    Discharge instructions   Complete by: As directed    PLEASE DO NOT MISS ANY DOSES OF YOUR PLAVIX!!!!! Also keep a log of you blood pressures and bring back to your follow up appt. Please call the office with any questions.   Patients taking blood thinners should generally stay away from medicines like ibuprofen, Advil, Motrin, naproxen, and Aleve due to risk of stomach bleeding. You may take Tylenol as directed or talk to your primary doctor about alternatives.  Some studies suggest Prilosec/Omeprazole interacts with Plavix. We changed your Prilosec/Omeprazole to the equivalent dose of Protonix for less chance of interaction.  PLEASE ENSURE THAT YOU DO NOT RUN OUT OF YOUR PLAVIX. This medication is very important to remain on for at least one year. IF you have issues obtaining this medication due to cost please CALL the office 3-5 business days prior to running out in order to prevent missing doses of this medication.   Increase activity slowly   Complete by: As directed        Discharge Medications   Allergies as of 05/27/2023       Reactions   Bextra [valdecoxib] Swelling        Medication  List     STOP taking these medications    losartan 100 MG tablet Commonly known as: COZAAR   omeprazole 20 MG  tablet Commonly known as: PRILOSEC OTC       TAKE these medications    aspirin EC 81 MG tablet Take 81 mg by mouth daily. Swallow whole.   carvedilol 6.25 MG tablet Commonly known as: COREG Take 1 tablet (6.25 mg total) by mouth 2 (two) times daily.   clopidogrel 75 MG tablet Commonly known as: PLAVIX Take 1 tablet (75 mg total) by mouth daily with breakfast. Start taking on: May 28, 2023   Dialyvite Vitamin D 5000 125 MCG (5000 UT) capsule Generic drug: Cholecalciferol Take 5,000 Units by mouth daily.   empagliflozin 10 MG Tabs tablet Commonly known as: JARDIANCE Take 1 tablet (10 mg total) by mouth daily. Start taking on: May 28, 2023   fluticasone 50 MCG/ACT nasal spray Commonly known as: FLONASE Place 1 spray into both nostrils at bedtime.   furosemide 20 MG tablet Commonly known as: LASIX Take 1 tablet (20 mg total) by mouth 2 (two) times daily. What changed:  when to take this reasons to take this   GENTEAL OP Place 1 drop into both eyes daily as needed (dry eyes).   indomethacin 50 MG capsule Commonly known as: INDOCIN Take 50 mg by mouth 2 (two) times daily as needed (gout flares).   nitroGLYCERIN 0.4 MG SL tablet Commonly known as: Nitrostat Place 1 tablet (0.4 mg total) under the tongue every 5 (five) minutes as needed for chest pain.   NITROGLYCERIN RE Place 1 application rectally daily as needed (rectal bleeding). 0.2%   pantoprazole 40 MG tablet Commonly known as: Protonix Take 1 tablet (40 mg total) by mouth daily.   Potassium 99 MG Tabs Take 99 mg by mouth daily as needed (cramps).   rosuvastatin 20 MG tablet Commonly known as: CRESTOR Take 1 tablet (20 mg total) by mouth daily.   sacubitril-valsartan 49-51 MG Commonly known as: ENTRESTO Take 1 tablet by mouth 2 (two) times daily.   VITAMIN K2 PO Take 1  tablet by mouth daily.         Outstanding Labs/Studies   BMET in 1 week   Duration of Discharge Encounter   Greater than 30 minutes including physician time.  Signed, Olena Leatherwood, PA-C 05/27/2023, 11:29 AM  ATTENDING ATTESTATION:  After conducting a review of all available clinical information with the care team, interviewing the patient, and performing a physical exam, I agree with the findings and plan described in this note.   See exam and plan in separate progress note from today.   Alverda Skeans, MD Pager [phone]

## 2023-05-27 NOTE — Care Management (Addendum)
  Transition of Care [facility]) Screening Note   Patient Details  Name: Jeff Warren Date of Birth: 1955/06/21   Transition of Care [facility]) CM/SW Contact:    Lockie Pares, RN Phone Number: 05/27/2023, 10:49 AM    Transition of Care Department [facility]) has reviewed patient and no TOC needs have been identified at this time. We will continue to monitor patient advancement through interdisciplinary progression rounds. If new patient transition needs arise, please place a TOC consult. Screened for Cardiac rehab, referral made by them to [facility]

## 2023-05-27 NOTE — Progress Notes (Signed)
Heart Failure Nurse Navigator Progress Note  PCP: Patient, No Pcp Per PCP-Cardiologist: Arida Admission Diagnosis: None Admitted from: Home  Presentation:   Jeff Warren presented with shortness of breath and chest tightness, BP 160/100, HR 63, admitted for elective L/R heart cath, Found to have significant underlying 3 vessel disease with patent distal RCA stent with moderate in-stent restenosis and patent LAD stent with significant in-stent restenosis. Also found to have new severe stenosis in large proximal ramus. S/p successful balloon angioplasty of the mid-LAD and DES to proximal ramus   Patient and family were educated on the sign and symptoms of heart failure, daily weighs, when ot call his doctor or go to the ED, Diet/ fluid restrictions, taking all medications as prescribed and attending all medical appointments. Patient verbalized his understanding. A HF TOC appointment was scheduled at [facility] on 06/08/2023 @ 4 pm.   ECHO/ LVEF: 30-35%  Clinical Course:  Past Medical History:  Diagnosis Date   Aortic root dilation (HCC)    a.) TTE 02/26/2021: mild; measured 40 mm.   BPH (benign prostatic hyperplasia)    CAD (coronary artery disease)    a. inferior ST elevation MI 12/05/16: cardiac cath LM nl, p-mLAD 80%, D1 CTO with right-to-left collats, LCx minor irregs,  m-dRCA 30%, dRCA 100% s/p PCI/DES, EF 45-50%, severely elevated LVEDP; b. staged PCI 12/23/16 - successful PCI/DES to p-mLAD with small diag being jailed by stent (vessel known to be subtotally occluded with R-L collats)   Dyspnea    Family history of premature CAD    a. sister passed from MI at age 81   GERD (gastroesophageal reflux disease)    Gout    HLD (hyperlipidemia)    Hypertension    LAFB (left anterior fascicular block)    ST elevation myocardial infarction (STEMI) of inferior wall (HCC) 12/05/2016   a.) LHC --> EF 45% with severely elevated LVEDP; 2v CAD; culprit was thrombotic occlusion of dRCA; D1 occluded with  R-L collaterals; 80% p-mLAD. PCI performed placing a 3.5 x 15 mm Resolute Onyx DES x 1 to dRCA. Plans for staged PCI of LAD.   Systolic dysfunction    a.) TTE 12/05/16: EF 50-55%, inf HK, GR1DD, mildly dilated LA. b.) TTE 02/26/21: EF 45-50%, inferolateral HK, mild MR, G2DD.   Urinary retention      Social History   Socioeconomic History   Marital status: Significant Other    Spouse name: Not on file   Number of children: Not on file   Years of education: Not on file   Highest education level: Not on file  Occupational History   Not on file  Tobacco Use   Smoking status: Never    Passive exposure: Never   Smokeless tobacco: Never  Vaping Use   Vaping status: Never Used  Substance and Sexual Activity   Alcohol use: No   Drug use: No   Sexual activity: Not Currently  Other Topics Concern   Not on file  Social History Narrative   Not on file   Social Drivers of Health   Financial Resource Strain: Not on file  Food Insecurity: No Food Insecurity (05/26/2023)   Hunger Vital Sign    Worried About Running Out of Food in the Last Year: Never true    Ran Out of Food in the Last Year: Never true  Transportation Needs: No Transportation Needs (05/26/2023)   PRAPARE - Administrator, Civil Service (Medical): No    Lack of Transportation (  Non-Medical): No  Physical Activity: Not on file  Stress: Not on file  Social Connections: Not on file   Education Assessment and Provision:  Detailed education and instructions provided on heart failure disease management including the following:  Signs and symptoms of Heart Failure When to call the physician Importance of daily weights Low sodium diet Fluid restriction Medication management Anticipated future follow-up appointments  Patient education given on each of the above topics.  Patient acknowledges understanding via teach back method and acceptance of all instructions.  Education Materials:  "Living Better With  Heart Failure" Booklet, HF zone tool, & Daily Weight Tracker Tool.  Patient has scale at home: yes Patient has pill box at home: His husband ordered one    High Risk Criteria for Readmission and/or Poor Patient Outcomes: Heart failure hospital admissions (last 6 months): 1  No Show rate: 8 % Difficult social situation: No, lives with his husband.  Demonstrates medication adherence: No Primary Language: English  Literacy level: reading, writing, and comprehension  Barriers of Care:   Medication compliance Diet/ fluid restrictions Daily weights  Considerations/Referrals:   Referral made to Heart Failure Pharmacist Stewardship: yes Referral made to Heart Failure CSW/NCM TOC: No Referral made to Heart & Vascular TOC clinic: Yes, 06/08/2023 @ 4 pm at [facility]  Items for Follow-up on DC/TOC: Medication compliance Diet/ fluid restrictions Daily weights Continued HF education   Rhae Hammock, BSN, RN Heart Failure Teacher, adult education Only

## 2023-05-27 NOTE — Progress Notes (Signed)
CARDIAC REHAB PHASE I   PRE:  Rate/Rhythm: 85 SR      SaO2: 98 RA   MODE:  Ambulation: 125 ft   POST:  Rate/Rhythm: 87 SR       SaO2: 98 RA   Pt ambulated in hallway, tolerated well with no CP, SOB or dizziness. Returned to bed with call bell and bedside table in reach. Post HF/stent education including restrictions, risk factors, exercise guidelines, antiplatelet therapy importance, HF booklet, NTG use, heart healthy diet and CRP2 reviewed. All questions and concerns addressed. Will refer to [facility] for CRP2. Plan for possible discharge today.   [id number] Woodroe Chen, RN BSN 05/27/2023 9:20 AM

## 2023-05-27 NOTE — Progress Notes (Addendum)
Patient Name: ROSIE BLANCHETTE Date of Encounter: 05/27/2023 [facility] Cardiologist: [name], MD   Interval Summary  .    Patient reports doing well overnight, no chest pain or shortness of breath. He tolerated Lasix well. He denies any post-op complications related to PCI. He will walk with cardiac rehab later today.  Vital Signs .    Vitals:   05/26/23 1500 05/26/23 1629 05/26/23 2058 05/27/23 0434  BP: (!) 143/93 (!) 158/115 (!) 143/93 (!) 155/99  Pulse: 64 71  75  Resp:  16 18 18   Temp:  98 F (36.7 C) 98.4 F (36.9 C) 98.3 F (36.8 C)  TempSrc:  Oral Oral Oral  SpO2: 97% 98%  99%  Weight:      Height:        Intake/Output Summary (Last 24 hours) at 05/27/2023 1103 Last data filed at 05/27/2023 0500 Gross per 24 hour  Intake 600 ml  Output 4000 ml  Net -3400 ml      05/26/2023    2:27 PM 05/26/2023    9:36 AM 04/29/2023    8:18 AM  Last 3 Weights  Weight (lbs) 256 lb 8 oz 260 lb 259 lb 2 oz  Weight (kg) 116.348 kg 117.935 kg 117.538 kg      Telemetry/ECG    Sinus rhythm with frequent PVCs - Personally Reviewed  Physical Exam .   GEN: Patient appears comfortable lying in bed, no acute distress   Neck: No JVD Cardiac: RRR, no murmurs, rubs, or gallops.  Respiratory: Clear to auscultation bilaterally. GI: Soft, nontender, non-distended  MS: No edema Skin: Right radial access site healing well, no bruising   Assessment & Plan .     Coronary artery disease involving native coronary artery of native heart with angina pectoris  Prior history of inferior wall STEMI, s/p PCI 11/2016 Post PCI (05/26/2023) -- Echo, completed 05/19/2023 had showed LVEF 30-35%, moderately dilated LV with global hypokinesis, moderate LVH, moderately reduced RV systolic function, moderately elevated pulmonary artery systolic  pressure, severely dilated left atria, mild to moderate mitral valve regurgitation, mild aortic valve regurgitation, mild tricuspid  regurgitation, mild to moderate pulmonic valve regurgitation, dilated IVC  -- Underwent cardiac catheterization on 05/26/2023  -- Cath showed significant three-vessel CAD with patent distal RCA stent with moderate in-stent restenosis, patent LAD stent with significant in-stent restenosis with an RFR ratio of 0.83 and new severe stenosis in a large proximal ramus. Right heart cath showed severely elevated RA pressure, severe pulmonary hypertension, moderately severely elevated wedge pressure and severely reduced cardiac output. LVEDP was severely elevated but in the setting of frequent PVCs  -- There was successful balloon angioplasty of mid LAD in-stent restenosis and drug-eluting stent placement to the proximal ramus -- Patient to walk with cardiac rehab later today  -- Continue Entresto, aspirin, Plavix  -- Consider starting SGLT2 pending approval from insurance  -- Will discuss starting spironolactone at follow up visit once patient has established a regular follow up for compliance and lab checks  -- Patient to follow up with [name] in [place] on 12/27, patient states he will be out of the state from Jan 2-28  Acute on chronic systolic heart failure  -- Discussed importance of taking daily weights at home along with remaining compliant with current medication regimen   -- Start Lasix 20 mg BID. Check BMP in 1 week   Hypertension  -- BP has been 140-150s/90-100s while in hospital  -- Patient has  been noncompliant with HTN medications in the past 2 years -- Continue coreg 6.25 mg BID, discussed the importance of remaining compliant   Sleep apnea -- Patient discusses being previously diagnosed with sleep apnea however was noncompliant with his CPAP machine. Has been educated prior on implant options -- Patient is willing to revisit this diagnosis to seek treatment and expresses understanding in the long term risks related to untreated sleep apnea  For questions or updates, please  contact [facility] Please consult [email or web address] for contact info under      Signed, Olena Leatherwood, PA-C    ATTENDING ATTESTATION:  After conducting a review of all available clinical information with the care team, interviewing the patient, and performing a physical exam, I agree with the findings and plan described in this note.   GEN: No acute distress.   HEENT:  MMM, no JVD, no scleral icterus Cardiac: RRR, no murmurs, rubs, or gallops.  Respiratory: Clear to auscultation bilaterally. GI: Soft, nontender, non-distended  MS: No edema; No deformity. Neuro:  Nonfocal  Vasc:  R radial intact.  Patient doing well after 2V PCI.  Elevated filling pressures on cardiac catheterization.  Agree with Jardiance and will start lasix 20mg  BID.  Discharge today with BMP in one week; close hospital follow up.  Alverda Skeans, MD Pager [phone]

## 2023-05-27 NOTE — Progress Notes (Signed)
Heart Failure Stewardship Pharmacist Progress Note   PCP: Patient, No Pcp Per PCP-Cardiologist: [name], MD    HPI:  67 yo M with PMH of CAD, HTN, HLD, CHF, and BPH.   Was seen by outpatient cardiology on 11/21 where he noted having significant exertional dyspnea with intermittent chest tightness. Was not taking his medications regularly. Repeat ECHO on 12/12 with EF reduced to 30-35%, global hypokinesis, moderate LVH, RV moderately reduced. He was initially scheduled for a stress test but with the drop in EF, it was recommended to pursue [facility].   Admitted on 12/18 for elective L/RHC. Found to have significant underlying 3 vessel disease with patent distal RCA stent with moderate in-stent restenosis and patent LAD stent with significant in-stent restenosis. Also found to have new severe stenosis in large proximal ramus. RA 23, PA 60, wedge 28, CO/CI 4.3/1.8. PAPi 2.0. S/p successful balloon angioplasty of the mid-LAD and DES to proximal ramus. Received two doses of IV lasix 20 mg.   Denies shortness of breath or LE edema. Does not have part D insurance with his Medicare.   Current HF Medications: Diuretic: furosemide 20 mg IV x 1 today Beta Blocker: carvedilol 6.25 mg BID ACE/ARB/ARNI: Entresto 49/51 mg BID SGLT2i: Jardiance 10 mg daily  Prior to admission HF Medications: Diuretic: furosemide 20 mg daily PRN Beta blocker: carvedilol 6.25 mg BID ACE/ARB/ARNI: losartan 100 mg daily  Pertinent Lab Values: Serum creatinine 1.24, BUN 21, Potassium 4.2, Sodium 140  Vital Signs: Weight: 256 lbs (admission weight: 256 lbs) Blood pressure: 150/90s Heart rate: 70-80s  I/O: net -1.9L yesterday; net -3.4L since admission  Medication Assistance / Insurance Benefits Check: Does the patient have prescription insurance?  No Type of insurance plan: only Medicare A/B  Does the patient qualify for medication assistance through manufacturers or grants?   Yes Eligible grants and/or  patient assistance programs: Valla Leaver Medication assistance applications in progress: Valla Leaver  Medication assistance applications approved: none Approved medication assistance renewals will be completed by: [facility]  Outpatient Pharmacy:  Prior to admission outpatient pharmacy: [facility] Is the patient willing to use [facility] TOC pharmacy at discharge? Yes Is the patient willing to transition their outpatient pharmacy to utilize a [facility] outpatient pharmacy?   No    Assessment: 1. Acute on chronic systolic CHF (LVEF 30-35%), due to ICM. NYHA class II symptoms. - S/p two doses of IV lasix 20 mg - last dose given this morning. Volume status improved.  - Continue carvedilol 6.25 mg BID - Agree with increasing to Entresto 49/51 mg BID - Consider starting spironolactone prior to discharge vs at follow up - Agree with adding Jardiance 10 mg daily   Plan: 1) Medication changes recommended at this time: - Agree with changes  2) Patient assistance: - No prescription insurance - starting patient assistance forms for Entresto and Jardiance - Is planning to be out of town for a few weeks in January ([place]) to housesit for a friend. Will likely need Jardiance and Farxiga samples from clinic to bridge him until he returns home.   3)  Education  - Patient has been educated on current HF medications and potential additions to HF medication regimen - Patient verbalizes understanding that over the next few months, these medication doses may change and more medications may be added to optimize HF regimen - Patient has been educated on basic disease state pathophysiology and goals of therapy   Sharen Hones, PharmD, BCPS Heart Failure Engineer, building services Phone [phone])  [phone]  

## 2023-05-28 LAB — LIPOPROTEIN A (LPA): Lipoprotein (a): 171.8 nmol/L — ABNORMAL HIGH (ref <75.0)

## 2023-05-28 MED ORDER — SACUBITRIL-VALSARTAN 49-51 MG PO TABS: 1.0000 | ORAL_TABLET | Freq: Two times a day (BID) | ORAL | 3 refills | Status: DC

## 2023-05-28 MED ORDER — EMPAGLIFLOZIN 10 MG PO TABS: 10.0000 mg | ORAL_TABLET | Freq: Every day | ORAL | 3 refills | Status: DC

## 2023-05-28 NOTE — Telephone Encounter (Signed)
Assistance has been successfully faxed and sent to be scanned

## 2023-06-04 ENCOUNTER — Encounter: Payer: Self-pay | Admitting: Cardiovascular Disease

## 2023-06-04 ENCOUNTER — Ambulatory Visit: Payer: Medicare Other | Attending: Cardiovascular Disease | Admitting: Cardiovascular Disease

## 2023-06-04 VITALS — BP 106/78 | HR 60 | Ht 73.0 in | Wt 248.6 lb

## 2023-06-04 DIAGNOSIS — I255 Ischemic cardiomyopathy: Secondary | ICD-10-CM | POA: Insufficient documentation

## 2023-06-04 DIAGNOSIS — I10 Essential (primary) hypertension: Secondary | ICD-10-CM | POA: Diagnosis present

## 2023-06-04 DIAGNOSIS — I5022 Chronic systolic (congestive) heart failure: Secondary | ICD-10-CM | POA: Diagnosis present

## 2023-06-04 DIAGNOSIS — E785 Hyperlipidemia, unspecified: Secondary | ICD-10-CM | POA: Insufficient documentation

## 2023-06-04 DIAGNOSIS — I25118 Atherosclerotic heart disease of native coronary artery with other forms of angina pectoris: Secondary | ICD-10-CM | POA: Insufficient documentation

## 2023-06-04 MED ORDER — SACUBITRIL-VALSARTAN 49-51 MG PO TABS: 1.0000 | ORAL_TABLET | Freq: Two times a day (BID) | ORAL | 0 refills | Status: DC

## 2023-06-04 MED ORDER — EMPAGLIFLOZIN 10 MG PO TABS: 10.0000 mg | ORAL_TABLET | Freq: Every day | ORAL | 0 refills | Status: DC

## 2023-06-04 NOTE — Patient Instructions (Signed)
Medication Instructions:  SAMPLES given *If you need a refill on your cardiac medications before your next appointment, please call your pharmacy*   Lab Work: Your provider would like for you to have the following labs today: BMET  If you have labs (blood work) drawn today and your tests are completely normal, you will receive your results only by: MyChart Message (if you have MyChart) OR A paper copy in the mail If you have any lab test that is abnormal or we need to change your treatment, we will call you to review the results.   Testing/Procedures: None ordered   Follow-Up: At [facility], you and your health needs are our priority.  As part of our continuing mission to provide you with exceptional heart care, we have created designated Provider Care Teams.  These Care Teams include your primary Cardiologist (physician) and Advanced Practice Providers (APPs -  Physician Assistants and Nurse Practitioners) who all work together to provide you with the care you need, when you need it.  We recommend signing up for the patient portal called "MyChart".  Sign up information is provided on this After Visit Summary.  MyChart is used to connect with patients for Virtual Visits (Telemedicine).  Patients are able to view lab/test results, encounter notes, upcoming appointments, etc.  Non-urgent messages can be sent to your provider as well.   To learn more about what you can do with MyChart, go to [email or web address].    Your next appointment:   2 month(s)  Provider:   You may see [name], MD or one of the following Advanced Practice Providers on your designated Care Team:   Nicolasa Ducking, NP Eula Listen, PA-C Cadence Fransico Michael, PA-C Charlsie Quest, NP [name], NP

## 2023-06-04 NOTE — Progress Notes (Signed)
Cardiology Office Note   Date:  06/04/2023   ID:  Jeff Warren, DOB [birth year], MRN [id number]  PCP:  Renford Dills, MD  Cardiologist:   [name], MD   Chief Complaint  Patient presents with   Follow-up    Follow up after heart cath.  Patient reports syncope episode on 05/28/23 which was 2 days after heart cath.      History of Present Illness: Jeff Warren is a 67 y.o. male who presents for a follow-up visit regarding coronary artery disease and chronic systolic heart failure.  He had inferior ST elevation myocardial infarction in June 2018.  Emergent cardiac catheterization showed occluded distal right coronary artery which was treated successfully with PCI and drug-eluting stent placement.  There was 80% proximal LAD stenosis which was subsequently treated with staged PCI.    He has known history of severe hyperlipidemia with intolerance to higher dose of atorvastatin.    Echocardiogram in September 2022 showed an EF of 45 to 50% with mild mitral regurgitation.  The patient has known history of poor follow-up and adherence to medications.  He was seen recently after 2 years of no follow-up and not taking medications. He complained of intermittent exertional chest tightness, shortness of breath and orthopnea.  He underwent an echocardiogram which showed an EF of 30 to 35%, mild to moderate mitral regurgitation and moderate pulmonary hypertension.  Given drop in ejection fraction and his symptoms, I proceeded with a right and left cardiac catheterization recently.  Right heart catheterization showed severely elevated filling pressures, severe pulmonary hypertension and severely reduced cardiac output.  Coronary angiography showed significant underlying three-vessel coronary artery disease with patent RCA stent with moderate in-stent restenosis, patent LAD stent with significant in-stent restenosis and new severe stenosis in a large proximal ramus.  I performed balloon angioplasty to  the mid LAD in-stent restenosis and drug-eluting stent placement to the proximal ramus.  He was observed overnight and given IV furosemide.  He was discharged home on small dose furosemide. He reports significant improvement in symptoms with resolution of chest pain and orthopnea.  He is taking his medications regularly.   Past Medical History:  Diagnosis Date   Aortic root dilation (HCC)    a.) TTE 02/26/2021: mild; measured 40 mm.   BPH (benign prostatic hyperplasia)    CAD (coronary artery disease)    a. inferior ST elevation MI 12/05/16: cardiac cath LM nl, p-mLAD 80%, D1 CTO with right-to-left collats, LCx minor irregs,  m-dRCA 30%, dRCA 100% s/p PCI/DES, EF 45-50%, severely elevated LVEDP; b. staged PCI 12/23/16 - successful PCI/DES to p-mLAD with small diag being jailed by stent (vessel known to be subtotally occluded with R-L collats)   Dyspnea    Family history of premature CAD    a. sister passed from MI at age 41   GERD (gastroesophageal reflux disease)    Gout    HLD (hyperlipidemia)    Hypertension    LAFB (left anterior fascicular block)    ST elevation myocardial infarction (STEMI) of inferior wall (HCC) 12/05/2016   a.) LHC --> EF 45% with severely elevated LVEDP; 2v CAD; culprit was thrombotic occlusion of dRCA; D1 occluded with R-L collaterals; 80% p-mLAD. PCI performed placing a 3.5 x 15 mm Resolute Onyx DES x 1 to dRCA. Plans for staged PCI of LAD.   Systolic dysfunction    a.) TTE 12/05/16: EF 50-55%, inf HK, GR1DD, mildly dilated LA. b.) TTE 02/26/21: EF 45-50%, inferolateral HK,  mild MR, G2DD.   Urinary retention     Past Surgical History:  Procedure Laterality Date   ABDOMINOPLASTY     CATARACT EXTRACTION Bilateral    COLONOSCOPY WITH PROPOFOL N/A 06/29/2016   Procedure: COLONOSCOPY WITH PROPOFOL;  Surgeon: Charolett Bumpers, MD;  Location: [facility];  Service: Endoscopy;  Laterality: N/A;   CORONARY BALLOON ANGIOPLASTY N/A 05/26/2023   Procedure: CORONARY  BALLOON ANGIOPLASTY;  Surgeon: Iran Ouch, MD;  [facility]: [facility];  Service: Cardiovascular;  Laterality: N/A;   CORONARY PRESSURE/FFR STUDY N/A 05/26/2023   Procedure: CORONARY PRESSURE/FFR STUDY;  Surgeon: Iran Ouch, MD;  Location: [facility];  Service: Cardiovascular;  Laterality: N/A;   CORONARY STENT INTERVENTION N/A 12/23/2016   Procedure: Coronary Stent Intervention;  Surgeon: Iran Ouch, MD;  Location: [facility];  Service: Cardiovascular;  Laterality: N/A;   CORONARY STENT INTERVENTION N/A 05/26/2023   Procedure: CORONARY STENT INTERVENTION;  Surgeon: Iran Ouch, MD;  [facility]: [facility];  Service: Cardiovascular;  Laterality: N/A;   CORONARY/GRAFT ACUTE MI REVASCULARIZATION N/A 12/05/2016   Procedure: Coronary/Graft Acute MI Revascularization;  Surgeon: Iran Ouch, MD;  Location: [facility];  Service: Cardiovascular;  Laterality: N/A;   ganglion cyst removal Right    GAS INSERTION Left 11/21/2020   Procedure: INSERTION OF GAS LEFT EYE (C3F8);  Surgeon: Stephannie Li, MD;  Location: [facility] OR;  Service: Ophthalmology;  Laterality: Left;   GAS/FLUID EXCHANGE Left 11/21/2020   Procedure: GAS/FLUID EXCHANGE;  Surgeon: Stephannie Li, MD;  Location: [facility] OR;  Service: Ophthalmology;  Laterality: Left;   HOLEP-LASER ENUCLEATION OF THE PROSTATE WITH MORCELLATION N/A 03/25/2021   Procedure: HOLEP-LASER ENUCLEATION OF THE PROSTATE WITH MORCELLATION;  Surgeon: Sondra Come, MD;  Location: [facility] ORS;  Service: Urology;  Laterality: N/A;   LEFT HEART CATH AND CORONARY ANGIOGRAPHY N/A 12/05/2016   Procedure: Left Heart Cath and Coronary Angiography;  Surgeon: Iran Ouch, MD;  Location: [facility];  Service: Cardiovascular;  Laterality: N/A;   NO PAST SURGERIES     Has has some cosmetic surgeries   RETINAL DETACHMENT SURGERY     RIGHT/LEFT HEART CATH AND CORONARY ANGIOGRAPHY N/A 05/26/2023   Procedure:  RIGHT/LEFT HEART CATH AND CORONARY ANGIOGRAPHY;  Surgeon: Iran Ouch, MD;  [facility];  Service: Cardiovascular;  Laterality: N/A;   VITRECTOMY 25 GAUGE WITH SCLERAL BUCKLE Left 11/21/2020   Procedure: VITRECTOMY 25 GAUGE WITH SCLERAL BUCKLE;  Surgeon: Stephannie Li, MD;  Location: [facility] OR;  Service: Ophthalmology;  Laterality: Left;     Current Outpatient Medications  Medication Sig Dispense Refill   aspirin EC 81 MG tablet Take 81 mg by mouth daily. Swallow whole.     Carboxymethylcell-Hypromellose (GENTEAL OP) Place 1 drop into both eyes daily as needed (dry eyes).     carvedilol (COREG) 6.25 MG tablet Take 1 tablet (6.25 mg total) by mouth 2 (two) times daily. 180 tablet 1   Cholecalciferol (DIALYVITE VITAMIN D 5000) 125 MCG (5000 UT) capsule Take 5,000 Units by mouth daily.     clopidogrel (PLAVIX) 75 MG tablet Take 1 tablet (75 mg total) by mouth daily with breakfast. 90 tablet 2   empagliflozin (JARDIANCE) 10 MG TABS tablet Take 1 tablet (10 mg total) by mouth daily. 90 tablet 3   fluticasone (FLONASE) 50 MCG/ACT nasal spray Place 1 spray into both nostrils at bedtime.     furosemide (LASIX) 20 MG  tablet Take 1 tablet (20 mg total) by mouth 2 (two) times daily. 60 tablet 1   indomethacin (INDOCIN) 50 MG capsule Take 50 mg by mouth 2 (two) times daily as needed (gout flares).     nitroGLYCERIN (NITROSTAT) 0.4 MG SL tablet Place 1 tablet (0.4 mg total) under the tongue every 5 (five) minutes as needed for chest pain. 25 tablet 2   pantoprazole (PROTONIX) 40 MG tablet Take 1 tablet (40 mg total) by mouth daily. 30 tablet 1   Potassium 99 MG TABS Take 99 mg by mouth daily as needed (cramps).     rosuvastatin (CRESTOR) 20 MG tablet Take 1 tablet (20 mg total) by mouth daily. 90 tablet 1   sacubitril-valsartan (ENTRESTO) 49-51 MG Take 1 tablet by mouth 2 (two) times daily. 180 tablet 3   Menaquinone-7 (VITAMIN K2 PO) Take 1 tablet by mouth daily. (Patient not taking:  Reported on 06/04/2023)     No current facility-administered medications for this visit.    Allergies:   Bextra [valdecoxib]    Social History:  The patient  reports that he has never smoked. He has never been exposed to tobacco smoke. He has never used smokeless tobacco. He reports that he does not drink alcohol and does not use drugs.   Family History:  The patient's family history includes Cancer in his father; Diabetes Mellitus II in his brother, mother, and sister; Kidney failure in his brother.    ROS:  Please see the history of present illness.   Otherwise, review of systems are positive for none.   All other systems are reviewed and negative.    PHYSICAL EXAM: VS:  BP 106/78 (BP Location: Left Arm, Patient Position: Sitting, Cuff Size: Normal)   Pulse 60   Ht 6\' 1"  (1.854 m)   Wt 248 lb 9.6 oz (112.8 kg)   SpO2 98%   BMI 32.80 kg/m  , BMI Body mass index is 32.8 kg/m. GEN: Well nourished, well developed, in no acute distress  HEENT: normal  Neck: no JVD, carotid bruits, or masses Cardiac: RRR; no murmurs, rubs, or gallops,no edema  Respiratory:  clear to auscultation bilaterally, normal work of breathing GI: soft, nontender, nondistended, + BS MS: no deformity or atrophy  Skin: warm and dry, no rash Neuro:  Strength and sensation are intact Psych: euthymic mood, full affect   EKG:  EKG is ordered today. The ekg ordered today demonstrates : Normal sinus rhythm Possible Left atrial enlargement Left anterior fascicular block Left ventricular hypertrophy with QRS widening ( Cornell product ) When compared with ECG of 27-May-2023 04:15, Premature ventricular complexes are no longer Present Left anterior fascicular block is now Present T wave inversion no longer evident in Inferior leads Nonspecific T wave abnormality no longer evident in Lateral leads          Recent Labs: 04/29/2023: ALT 45 05/27/2023: BUN 21; Creatinine, Ser 1.24; Hemoglobin 15.2; Platelets  211; Potassium 4.2; Sodium 140    Lipid Panel    Component Value Date/Time   CHOL 234 (H) 04/29/2023 0858   TRIG 143 04/29/2023 0858   HDL 36 (L) 04/29/2023 0858   CHOLHDL 6.5 (H) 04/29/2023 0858   CHOLHDL 6.0 12/06/2016 0546   VLDL 34 12/06/2016 0546   LDLCALC 172 (H) 04/29/2023 0858      Wt Readings from Last 3 Encounters:  06/04/23 248 lb 9.6 oz (112.8 kg)  05/26/23 256 lb 8 oz (116.3 kg)  04/29/23 259 lb 2 oz (117.5  kg)           No data to display            ASSESSMENT AND PLAN:  1.  Coronary artery disease involving native coronary arteries with other forms of angina: Status post recent revascularization.  Recommend continuing dual antiplatelet therapy for at least 6 months and preferably long-term given multiple stents.   The patient was encouraged to attend cardiac rehab.  2.  Chronic systolic heart failure due to ischemic cardiomyopathy: Most recent ejection fraction was 30 to 35%.  Volume overload improved significantly with the addition of furosemide and his weight is down 11 pounds.  Continue small dose furosemide 20 mg twice daily.  Continue carvedilol, Jardiance and Entresto.  Check basic metabolic profile today.  Consider adding spironolactone in the near future.  3.  Hyperlipidemia: He had some leg pain on high-dose rosuvastatin.  He is currently tolerating 20 mg daily.  4.  Essential hypertension: Blood pressure is well-controlled on current medications.   Disposition:   FU in 2 months.   Signed,  [name], MD  06/04/2023 3:34 PM    [facility]

## 2023-06-05 LAB — BASIC METABOLIC PANEL
BUN/Creatinine Ratio: 16 (ref 10–24)
BUN: 23 mg/dL (ref 8–27)
CO2: 24 mmol/L (ref 20–29)
Calcium: 9.7 mg/dL (ref 8.6–10.2)
Chloride: 101 mmol/L (ref 96–106)
Creatinine, Ser: 1.47 mg/dL — ABNORMAL HIGH (ref 0.76–1.27)
Glucose: 92 mg/dL (ref 70–99)
Potassium: 4.4 mmol/L (ref 3.5–5.2)
Sodium: 140 mmol/L (ref 134–144)
eGFR: 52 mL/min/{1.73_m2} — ABNORMAL LOW (ref >59)

## 2023-06-08 ENCOUNTER — Encounter: Payer: Medicare Other | Admitting: Family

## 2023-06-11 ENCOUNTER — Other Ambulatory Visit: Payer: Self-pay | Admitting: *Deleted

## 2023-06-11 MED ORDER — FUROSEMIDE 20 MG PO TABS: 20.0000 mg | ORAL_TABLET | Freq: Every day | ORAL | 1 refills | Status: DC

## 2023-06-23 ENCOUNTER — Encounter (HOSPITAL_COMMUNITY): Payer: Self-pay

## 2023-06-25 ENCOUNTER — Telehealth: Payer: Self-pay | Admitting: Cardiovascular Disease

## 2023-06-25 MED ORDER — CLOPIDOGREL BISULFATE 75 MG PO TABS: 75.0000 mg | ORAL_TABLET | Freq: Every day | ORAL | 0 refills | Status: DC

## 2023-06-25 NOTE — Telephone Encounter (Addendum)
Last office visit: 06/04/23 with plan to f/u 2 months. next office visit: 08/09/23  Requested Prescriptions   Signed Prescriptions Disp Refills   clopidogrel (PLAVIX) 75 MG tablet 90 tablet 0    Sig: Take 1 tablet (75 mg total) by mouth daily with breakfast.    Authorizing Provider: [name] A    Ordering User: Guerry Minors

## 2023-06-25 NOTE — Telephone Encounter (Signed)
*  STAT* If patient is at the pharmacy, call can be transferred to refill team.   1. Which medications need to be refilled? (please list name of each medication and dose if known) clopidogrel (PLAVIX) 75 MG tablet   2. Which pharmacy/location (including street and city if local pharmacy) is medication to be sent to? [facility] [address] [place], [place] [address]  3. Do they need a 30 day or 90 day supply? 90

## 2023-07-12 ENCOUNTER — Ambulatory Visit: Payer: Medicare Other | Admitting: Student

## 2023-08-06 NOTE — Progress Notes (Unsigned)
 Cardiology Clinic Note   Date: 08/09/2023 ID: Jeff, Warren September 22, 1955, MRN [id number]  Primary Cardiologist:  Jeff Bears, MD  Chief Complaint   Jeff Warren is a 68 y.o. male who presents to the clinic today for routine follow up.   Patient Profile   Jeff Warren is followed by Jeff Warren for the history outlined below.      Past medical history significant for: CAD. LHC 12/05/2016 (STEMI): Distal RCA 100%.  Mid to distal RCA 30%.  Proximal to mid LAD 80%.  D1 100%.  Mild LVH systolic dysfunction.  EF 45 to 50%.  Severely elevated LVEDP.  PCI with DES 3.5 x 15 mm to distal RCA. Staged PCI 12/23/2016: Mid to distal RCA 30%.  D1 100% with right to left collaterals.  Proximal to mid LAD 80%.  PCI with DES 3 x 23 mm to proximal to mid LAD. R/LHC 05/26/2023 (acute on chronic systolic heart failure): D1 100%.  Mid LAD 70%.  Ostial LM 20%.  Mid to distal RCA 40%.  Distal RCA 40%.  Ramus 95%.  PCI with DES 3.0 x 24 mm to proximal ramus.  Successful balloon angioplasty to mid LAD in-stent restenosis. Chronic systolic heart failure. Echo 05/19/2023: EF 30 to 35%.  Global hypokinesis.  Moderately dilated LV internal cavity.  Moderate LVH.  Indeterminate diastolic parameters.  Moderately reduced RV function.  Normal RV size.  Moderately elevated PA pressure, RVSP 45.3 mmHg.  Severe LAE.  Mild to moderate MR.  Mild AI.  Borderline dilatation of aortic root 37 mm. R/LHC 05/26/2023: Severely elevated RA pressure, severe pulmonary hypertension, moderate to severely elevated wedge pressure, severely reduced cardiac output.  LVEDP severely elevated in the setting of frequent PVCs. Hypertension. Hyperlipidemia. Lipid panel 04/29/2023: LDL 172, HDL 36, TG 143, total 234. LPa 05/27/2023: 171.8. OSA. Family history of premature CAD.  In summary, patient has a history of STEMI in June 2018.  He underwent PCI with DES to distal RCA followed by staged PCI in July 2018 with DES to proximal to mid LAD as  detailed above.  Echo showed EF 50 to 55%, moderate concentric LVH, mild hypokinesis of inferior myocardium, Grade I DD, mild LAE.  Repeat echo August 2018 showed EF 55 to 60%, no RWMA, Grade I DD, trivial AI, mild MR, mild LAE.  Patient was seen for overdue follow-up in September 2022 and complained of worsening dyspnea and fatigue with intermittent lower extremity edema.  Echo was updated at that time and demonstrated EF 45 to 50%, mild hypokinesis of left ventricular entire inferolateral wall, Grade II DD, normal RV size/function, mild MR, mild dilatation of aortic root 40 mm.  In November 2024 patient was seen for overdue follow-up and complained of exertional dyspnea with intermittent chest tightness similar to previous angina.  He reported stopping most medications and taking some intermittently.  He was scheduled for a cardiac PET/CT and repeat echo.  Echo demonstrated EF 30 to 35% as detailed above.  Cardiac PET/CT was canceled and he underwent R/LHC.  He underwent PCI with stent placement and balloon angioplasty as detailed above.  Right heart cath demonstrated elevated heart pressures as detailed above.  Patient was last seen in the office by Jeff Warren on 06/04/2023 for follow-up after heart catheterization.  He reported significant improvement in symptoms with resolution of chest pain and orthopnea.  He was adherent to medications.     History of Present Illness    Today, patient is doing well.  Patient denies shortness of breath, dyspnea on exertion, lower extremity edema, orthopnea or PND. No chest pain, pressure, or tightness. No palpitations. He reports stopping Lasix secondary to a bad gout flare for a couple of months he felt Lasix was contributing to.  Daily weight has been stable, typically between 239-240 lb. He was not able to start cardiac rehab, as he was in [place] for the month of January and then he was having difficulty walking secondary to foot pain from gout. He is very interested  in getting started now that gout is under control. He questions if he can take indomethacin for his gout. Discussed the bleeding risk of taking this with aspirin and Plavix. Stressed the importance or remaining on DAPT. He used to take colchicine but it was stopped when he started carvedilol.     ROS: All other systems reviewed and are otherwise negative except as noted in History of Present Illness.  EKGs/Labs Reviewed    EKG Interpretation Date/Time:  Monday August 09 2023 09:17:34 EST Ventricular Rate:  62 PR Interval:  196 QRS Duration:  112 QT Interval:  438 QTC Calculation: 444 R Axis:   -65  Text Interpretation: Normal sinus rhythm Left anterior fascicular block When compared with ECG of 04-Jun-2023 15:31, No significant change was found Confirmed by [name] [phone]) on 08/09/2023 9:22:28 AM   04/29/2023: ALT 45; AST 31 06/04/2023: BUN 23; Creatinine, Ser 1.47; Potassium 4.4; Sodium 140   05/27/2023: Hemoglobin 15.2; WBC 9.1   Physical Exam    VS:  BP 110/80 (BP Location: Left Arm)   Pulse 62   Ht 6\' 1"  (1.854 m)   Wt 242 lb (109.8 kg)   SpO2 98%   BMI 31.93 kg/m  , BMI Body mass index is 31.93 kg/m.  GEN: Well nourished, well developed, in no acute distress. Neck: No JVD or carotid bruits. Cardiac:  RRR. No murmurs. No rubs or gallops.   Respiratory:  Respirations regular and unlabored. Clear to auscultation without rales, wheezing or rhonchi. GI: Soft, nontender, nondistended. Extremities: Radials/DP/PT 2+ and equal bilaterally. No clubbing or cyanosis. No edema.  Skin: Warm and dry, no rash. Neuro: Strength intact.  Assessment & Plan   CAD S/p PCI with DES to distal RCA June 2018 in the setting of STEMI.  He underwent staged PCI with DES to mid LAD in July 2018.  He developed acute on chronic systolic heart failure and underwent PCI with DES to proximal ramus and balloon angioplasty to mid LAD in-stent restenosis in December 2024.  Patient denies chest  pain, pressure or tightness. He was not able to start cardiac rehab in January secondary to being in [place] for the month of January and then being unable to walk for prolonged periods from gout flare.  -Re-refer to cardiac rehab.  -Continue aspirin, Plavix, carvedilol, rosuvastatin, as needed SL NTG.  Chronic systolic heart failure Echo December 2024 showed EF 30 to 35%, global hypokinesis, moderate LVH, moderately reduced RV function, moderately elevated PA pressure, mild to moderate MR, mild AI.  Right heart catheterization December 2024 demonstrated severely elevated RA pressure, severe pulmonary hypertension, moderate to severely elevated wedge pressure, severely reduced cardiac output.  Patient denies shortness of breath, DOE, lower extremity edema, orthopnea or PND. He stopped Lasix secondary to a 2 month history of gout flare. Daily weight has been stable.  Euvolemic and well compensated on exam. -Take Lasix as needed for weight gain or lower extremity edema.  -Continue carvedilol, Entresto, Jardiance. -Add  spironolactone 12.5 mg daily.  -CMP today.  -Repeat BMP in 2 weeks.  -Repeat echo in April.   Hypertension BP today 110/80. -Continue carvedilol, Entresto. -Add spironolactone as above.  Hyperlipidemia LDL November 2024 172, LPa December 2024 171.8.  Not at goal. -Continue rosuvastatin. -Repeat lipid panel in 2 weeks.   Gout Patient with flare of gout for about 2 months. He stopped Lasix and it improved. Discussed avoiding Indocin while on DAPT. He has been on colchicine in the past but it was stopped when he was started on carvedilol. His BP is well controlled with Entresto. Suggested changing to Toprol so he can go back to colchicine. He would like to hold off for now. He will contact the office if he decided to make this change.  -Recommend Toprol 50 mg daily to replace carvedilol.   Disposition: Add spirnolactone 12.5 mg daily. CMP today. BMP and lipid panel in 2 weeks.  Return in 3 months or sooner as needed.     Cardiac Rehabilitation Eligibility Assessment  The patient is ready to start cardiac rehabilitation from a cardiac standpoint.        Signed, Etta Grandchild. Vasili Fok, DNP, NP-C

## 2023-08-09 ENCOUNTER — Other Ambulatory Visit: Payer: Self-pay

## 2023-08-09 ENCOUNTER — Telehealth: Payer: Self-pay | Admitting: Emergency Medicine

## 2023-08-09 ENCOUNTER — Ambulatory Visit: Payer: Medicare Other | Attending: Student | Admitting: Student

## 2023-08-09 ENCOUNTER — Encounter: Payer: Self-pay | Admitting: Student

## 2023-08-09 VITALS — BP 110/80 | HR 62 | Ht 73.0 in | Wt 242.0 lb

## 2023-08-09 DIAGNOSIS — I25118 Atherosclerotic heart disease of native coronary artery with other forms of angina pectoris: Secondary | ICD-10-CM | POA: Diagnosis not present

## 2023-08-09 DIAGNOSIS — Z79899 Other long term (current) drug therapy: Secondary | ICD-10-CM | POA: Diagnosis present

## 2023-08-09 DIAGNOSIS — E785 Hyperlipidemia, unspecified: Secondary | ICD-10-CM | POA: Diagnosis not present

## 2023-08-09 DIAGNOSIS — I255 Ischemic cardiomyopathy: Secondary | ICD-10-CM

## 2023-08-09 DIAGNOSIS — I10 Essential (primary) hypertension: Secondary | ICD-10-CM

## 2023-08-09 DIAGNOSIS — I5022 Chronic systolic (congestive) heart failure: Secondary | ICD-10-CM | POA: Diagnosis present

## 2023-08-09 MED ORDER — ROSUVASTATIN CALCIUM 20 MG PO TABS: 20.0000 mg | ORAL_TABLET | Freq: Every day | ORAL | 3 refills | Status: AC

## 2023-08-09 MED ORDER — CLOPIDOGREL BISULFATE 75 MG PO TABS: 75.0000 mg | ORAL_TABLET | Freq: Every day | ORAL | 3 refills | Status: DC

## 2023-08-09 MED ORDER — SPIRONOLACTONE 25 MG PO TABS: 12.5000 mg | ORAL_TABLET | Freq: Every day | ORAL | 3 refills | Status: DC

## 2023-08-09 NOTE — Telephone Encounter (Signed)
 Pt called for lab add-ons and to come in fasting, message left and spoke with partner Melvenia Beam (as per DPR)

## 2023-08-09 NOTE — Addendum Note (Signed)
 Addended by: Ursula Alert on: 08/09/2023 11:18 AM   Modules accepted: Orders

## 2023-08-09 NOTE — Patient Instructions (Signed)
 Medication Instructions:  Your physician recommends the following medication changes.  START TAKING: Spironolactone 12.5 mg daily  *If you need a refill on your cardiac medications before your next appointment, please call your pharmacy*   Lab Work: Your provider would like for you to have following labs drawn today CMeT.    Your provider would like for you to return in 2 weeks to have the following labs drawn: BMeT.   Please go to [facility] [address] [address], [place] [address] You do not need an appointment.  They are open from 8 am- 4:30 pm.  Lunch from 1:00 pm- 2:00 pm You DO NOT need to be fasting.   You may also go to one of the following [facility]:  [address] [place], [place] [address] Phone: [phone] Lab hours: Mon-Fri 8 am- 5 pm    Lunch 12 pm- 1 pm  [address] [place],  [place]  [address]  [place] Phone: [phone] Lab hours: 7 am- 4 pm Lunch 12 pm-1 pm   [address] [place],  [place]  [address]  [place] Phone: [phone] Lab hours: Mon-Fri 8 am- 5 pm    Lunch 12 pm- 1 pm  If you have labs (blood work) drawn today and your tests are completely normal, you will receive your results only by: MyChart Message (if you have MyChart) OR A paper copy in the mail If you have any lab test that is abnormal or we need to change your treatment, we will call you to review the results.   Testing/Procedures: Your physician has requested that you have an echocardiogram in April. Echocardiography is a painless test that uses sound waves to create images of your heart. It provides your doctor with information about the size and shape of your heart and how well your heart's chambers and valves are working.   You may receive an ultrasound enhancing agent through an IV if needed to better visualize your heart during the echo. This procedure takes approximately one hour.  There are no restrictions for this procedure.  This will take  place at [address] [facility]) [address], [place] [address]  Please note: We ask at that you not bring children with you during ultrasound (echo/ vascular) testing. Due to room size and safety concerns, children are not allowed in the ultrasound rooms during exams. Our front office staff cannot provide observation of children in our lobby area while testing is being conducted. An adult accompanying a patient to their appointment will only be allowed in the ultrasound room at the discretion of the ultrasound technician under special circumstances. We apologize for any inconvenience.    Follow-Up: At [facility], you and your health needs are our priority.  As part of our continuing mission to provide you with exceptional heart care, we have created designated Provider Care Teams.  These Care Teams include your primary Cardiologist (physician) and Advanced Practice Providers (APPs -  Physician Assistants and Nurse Practitioners) who all work together to provide you with the care you need, when you need it.  We recommend signing up for the patient portal called "MyChart".  Sign up information is provided on this After Visit Summary.  MyChart is used to connect with patients for Virtual Visits (Telemedicine).  Patients are able to view lab/test results, encounter notes, upcoming appointments, etc.  Non-urgent messages can be sent to your provider as well.   To learn more about what you can do  with MyChart, go to [email or web address].    Your next appointment:   3 month(s)  Provider:   You may see [name], MD or [name], NP

## 2023-08-10 LAB — COMPREHENSIVE METABOLIC PANEL
ALT: 14 IU/L (ref 0–44)
AST: 18 IU/L (ref 0–40)
Albumin: 4.3 g/dL (ref 3.9–4.9)
Alkaline Phosphatase: 92 IU/L (ref 44–121)
BUN/Creatinine Ratio: 18 (ref 10–24)
BUN: 25 mg/dL (ref 8–27)
Bilirubin Total: 0.5 mg/dL (ref 0.0–1.2)
CO2: 24 mmol/L (ref 20–29)
Calcium: 9.1 mg/dL (ref 8.6–10.2)
Chloride: 105 mmol/L (ref 96–106)
Creatinine, Ser: 1.41 mg/dL — ABNORMAL HIGH (ref 0.76–1.27)
Globulin, Total: 2.8 g/dL (ref 1.5–4.5)
Glucose: 97 mg/dL (ref 70–99)
Potassium: 4.7 mmol/L (ref 3.5–5.2)
Sodium: 141 mmol/L (ref 134–144)
Total Protein: 7.1 g/dL (ref 6.0–8.5)
eGFR: 55 mL/min/{1.73_m2} — ABNORMAL LOW (ref >59)

## 2023-08-11 ENCOUNTER — Other Ambulatory Visit: Payer: Self-pay | Admitting: Emergency Medicine

## 2023-08-11 DIAGNOSIS — Z79899 Other long term (current) drug therapy: Secondary | ICD-10-CM

## 2023-08-23 ENCOUNTER — Encounter: Payer: Self-pay | Admitting: Family Medicine

## 2023-08-23 ENCOUNTER — Ambulatory Visit: Payer: Medicare Other | Admitting: Family Medicine

## 2023-08-23 VITALS — BP 132/83 | HR 63 | Ht 73.0 in | Wt 244.8 lb

## 2023-08-23 DIAGNOSIS — K602 Anal fissure, unspecified: Secondary | ICD-10-CM

## 2023-08-23 DIAGNOSIS — M109 Gout, unspecified: Secondary | ICD-10-CM

## 2023-08-23 DIAGNOSIS — K648 Other hemorrhoids: Secondary | ICD-10-CM

## 2023-08-23 DIAGNOSIS — Z1159 Encounter for screening for other viral diseases: Secondary | ICD-10-CM

## 2023-08-23 DIAGNOSIS — N1831 Chronic kidney disease, stage 3a: Secondary | ICD-10-CM | POA: Diagnosis not present

## 2023-08-23 DIAGNOSIS — I10 Essential (primary) hypertension: Secondary | ICD-10-CM | POA: Diagnosis not present

## 2023-08-23 DIAGNOSIS — I2510 Atherosclerotic heart disease of native coronary artery without angina pectoris: Secondary | ICD-10-CM

## 2023-08-23 DIAGNOSIS — E782 Mixed hyperlipidemia: Secondary | ICD-10-CM

## 2023-08-23 DIAGNOSIS — I5020 Unspecified systolic (congestive) heart failure: Secondary | ICD-10-CM | POA: Diagnosis not present

## 2023-08-23 DIAGNOSIS — E039 Hypothyroidism, unspecified: Secondary | ICD-10-CM

## 2023-08-23 MED ORDER — ALLOPURINOL 100 MG PO TABS: 50.0000 mg | ORAL_TABLET | Freq: Every day | ORAL | 3 refills | Status: DC

## 2023-08-23 MED ORDER — METHYLPREDNISOLONE 4 MG PO TBPK: ORAL_TABLET | ORAL | 0 refills | Status: DC

## 2023-08-23 NOTE — Assessment & Plan Note (Addendum)
 Heart failure with recent stent placement in December. Currently under cardiac care with local cardiology. Blood pressure is well-controlled with current medications. Recent switch two weeks ago from Lasix to spironolactone due to gout flare-ups. Discussed potential switch from carvedilol to metoprolol to allow for colchicine use if needed. - Continue carvedilol, spironolactone , Jardiance and Entresto.

## 2023-08-23 NOTE — Progress Notes (Signed)
 New patient visit   Patient: Jeff Warren   DOB: 08-03-1955   68 y.o. Male  MRN: [id number] Visit Date: 08/23/2023  Today's healthcare provider: Sherlyn Hay, DO   Chief Complaint  Patient presents with   New Patient (Initial Visit)    Patient is present to establish care and would like to see about a rx for his gout as well as he is having trouble with anal fistula and his rx ran out and would like to see about a rx   Care Management    Medicare AWV Hepatitis C Screening Tetanus Vaccine - advised to visit local pharmacy Zoster Vaccines advised to visit local pharmacy   Subjective    Jeff Warren is a 68 y.o. male who presents today as a new patient to establish care.  HPI HPI     New Patient (Initial Visit)    Additional comments: Patient is present to establish care and would like to see about a rx for his gout as well as he is having trouble with anal fistula and his rx ran out and would like to see about a rx        Care Management    Additional comments: Medicare AWV Hepatitis C Screening Tetanus Vaccine - advised to visit local pharmacy Zoster Vaccines advised to visit local pharmacy      Last edited by Acey Lav, CMA on 08/23/2023  2:01 PM.      Jeff Warren is a 68 year old male with anal fistula and heart failure who presents to establish care and address medication management. He was referred by Dr. Ritta Slot for continued management of his anal fistula after Dr. Jennye Boroughs retirement.  He has run out of nitroglycerin ointment, which was prescribed to heal a large anal fissure identified during a previous consultation for hemorrhoid banding. The fissure had healed sufficiently to reduce bleeding and pain, but he is currently experiencing some pain and discomfort again.  He has a history of heart failure and underwent a stent placement in December. He is currently under cardiac care at a local facility. His blood pressure has improved with current  medications, which include spironolactone, after experiencing gout with Lasix. He was previously on carvedilol but is considering a switch to metoprolol to manage heart rate and allow for colchicine use for gout management.  He has a history of gout, which has been exacerbated by previous medications. He has experienced flare-ups in the ankle and knee, requiring corticosteroid injections for relief. He avoids anti-inflammatories due to concurrent use of Plavix and aspirin. Dietary modifications include avoiding alcohol, red meat, and dark meat, while consuming chicken and occasional fish. He reports a recent gout flare-up in the foot and ankle, with a sensation of 'walking on a pad' indicating a potential new flare.  He has concerns about kidney function, noting a history of elevated creatinine and protein in urine, and is cautious about medications that may affect renal health. He has chronic kidney disease stage 3A.  He has not received the COVID booster and is uncertain about his last Tdap vaccination, though he recalls receiving one from a previous physician.    Past Medical History:  Diagnosis Date   Aortic root dilation (HCC)    a.) TTE 02/26/2021: mild; measured 40 mm.   BPH (benign prostatic hyperplasia)    CAD (coronary artery disease)    a. inferior ST elevation MI 12/05/16: cardiac cath LM nl, p-mLAD 80%, D1 CTO with  right-to-left collats, LCx minor irregs,  m-dRCA 30%, dRCA 100% s/p PCI/DES, EF 45-50%, severely elevated LVEDP; b. staged PCI 12/23/16 - successful PCI/DES to p-mLAD with small diag being jailed by stent (vessel known to be subtotally occluded with R-L collats)   Dyspnea    Family history of premature CAD    a. sister passed from MI at age 72   GERD (gastroesophageal reflux disease)    Gout    HLD (hyperlipidemia)    Hypertension    LAFB (left anterior fascicular block)    Severe sepsis (HCC) 12/03/2020   ST elevation myocardial infarction (STEMI) of inferior wall  (HCC) 12/05/2016   a.) LHC --> EF 45% with severely elevated LVEDP; 2v CAD; culprit was thrombotic occlusion of dRCA; D1 occluded with R-L collaterals; 80% p-mLAD. PCI performed placing a 3.5 x 15 mm Resolute Onyx DES x 1 to dRCA. Plans for staged PCI of LAD.   Systolic dysfunction    a.) TTE 12/05/16: EF 50-55%, inf HK, GR1DD, mildly dilated LA. b.) TTE 02/26/21: EF 45-50%, inferolateral HK, mild MR, G2DD.   Urinary retention    Past Surgical History:  Procedure Laterality Date   ABDOMINOPLASTY     CATARACT EXTRACTION Bilateral    COLONOSCOPY WITH PROPOFOL N/A 06/29/2016   Procedure: COLONOSCOPY WITH PROPOFOL;  Surgeon: Charolett Bumpers, MD;  Location: [facility];  Service: Endoscopy;  Laterality: N/A;   CORONARY BALLOON ANGIOPLASTY N/A 05/26/2023   Procedure: CORONARY BALLOON ANGIOPLASTY;  Surgeon: Iran Ouch, MD;  Location: [facility];  Service: Cardiovascular;  Laterality: N/A;   CORONARY PRESSURE/FFR STUDY N/A 05/26/2023   Procedure: CORONARY PRESSURE/FFR STUDY;  Surgeon: Iran Ouch, MD;  Location: [facility];  Service: Cardiovascular;  Laterality: N/A;   CORONARY STENT INTERVENTION N/A 12/23/2016   Procedure: Coronary Stent Intervention;  Surgeon: Iran Ouch, MD;  Location: [facility];  Service: Cardiovascular;  Laterality: N/A;   CORONARY STENT INTERVENTION N/A 05/26/2023   Procedure: CORONARY STENT INTERVENTION;  Surgeon: Iran Ouch, MD;  Location: [facility];  Service: Cardiovascular;  Laterality: N/A;   CORONARY/GRAFT ACUTE MI REVASCULARIZATION N/A 12/05/2016   Procedure: Coronary/Graft Acute MI Revascularization;  Surgeon: Iran Ouch, MD;  Location: [facility];  Service: Cardiovascular;  Laterality: N/A;   ganglion cyst removal Right    GAS INSERTION Left 11/21/2020   Procedure: INSERTION OF GAS LEFT EYE (C3F8);  Surgeon: Stephannie Li, MD;  Location: [facility] OR;  Service: Ophthalmology;  Laterality: Left;    GAS/FLUID EXCHANGE Left 11/21/2020   Procedure: GAS/FLUID EXCHANGE;  Surgeon: Stephannie Li, MD;  Location: [facility] OR;  Service: Ophthalmology;  Laterality: Left;   HOLEP-LASER ENUCLEATION OF THE PROSTATE WITH MORCELLATION N/A 03/25/2021   Procedure: HOLEP-LASER ENUCLEATION OF THE PROSTATE WITH MORCELLATION;  Surgeon: Sondra Come, MD;  Location: [facility] ORS;  Service: Urology;  Laterality: N/A;   LEFT HEART CATH AND CORONARY ANGIOGRAPHY N/A 12/05/2016   Procedure: Left Heart Cath and Coronary Angiography;  Surgeon: Iran Ouch, MD;  Location: [facility];  Service: Cardiovascular;  Laterality: N/A;   NO PAST SURGERIES     Has has some cosmetic surgeries   RETINAL DETACHMENT SURGERY     RIGHT/LEFT HEART CATH AND CORONARY ANGIOGRAPHY N/A 05/26/2023   Procedure: RIGHT/LEFT HEART CATH AND CORONARY ANGIOGRAPHY;  Surgeon: Iran Ouch, MD;  Location: [facility];  Service: Cardiovascular;  Laterality: N/A;   VITRECTOMY 25 GAUGE WITH SCLERAL  BUCKLE Left 11/21/2020   Procedure: VITRECTOMY 25 GAUGE WITH SCLERAL BUCKLE;  Surgeon: Stephannie Li, MD;  Location: [facility] OR;  Service: Ophthalmology;  Laterality: Left;   Family Status  Relation Name Status   Mother  Deceased   Father  Deceased   Sister  Deceased   Brother  Deceased  No partnership data on file   Family History  Problem Relation Age of Onset   Diabetes Mellitus II Mother    Cancer Father    Diabetes Mellitus II Sister    Diabetes Mellitus II Brother    Kidney failure Brother    Social History   Socioeconomic History   Marital status: Significant Other    Spouse name: Not on file   Number of children: Not on file   Years of education: Not on file   Highest education level: Bachelor's degree (e.g., BA, AB, BS)  Occupational History   Occupation: Retired  Tobacco Use   Smoking status: Never    Passive exposure: Never   Smokeless tobacco: Never  Vaping Use   Vaping status: Never Used  Substance and  Sexual Activity   Alcohol use: No   Drug use: No   Sexual activity: Not Currently  Other Topics Concern   Not on file  Social History Narrative   Not on file   Social Drivers of Health   Financial Resource Strain: Medium Risk (05/27/2023)   Overall Financial Resource Strain (CARDIA)    Difficulty of Paying Living Expenses: Somewhat hard  Food Insecurity: No Food Insecurity (05/26/2023)   Hunger Vital Sign    Worried About Running Out of Food in the Last Year: Never true    Ran Out of Food in the Last Year: Never true  Transportation Needs: No Transportation Needs (05/26/2023)   PRAPARE - Administrator, Civil Service (Medical): No    Lack of Transportation (Non-Medical): No  Physical Activity: Not on file  Stress: Not on file  Social Connections: Not on file   Outpatient Medications Prior to Visit  Medication Sig   aspirin EC 81 MG tablet Take 81 mg by mouth daily. Swallow whole.   Carboxymethylcell-Hypromellose (GENTEAL OP) Place 1 drop into both eyes daily as needed (dry eyes).   carvedilol (COREG) 6.25 MG tablet Take 1 tablet (6.25 mg total) by mouth 2 (two) times daily.   Cholecalciferol (DIALYVITE VITAMIN D 5000) 125 MCG (5000 UT) capsule Take 5,000 Units by mouth daily.   clopidogrel (PLAVIX) 75 MG tablet Take 1 tablet (75 mg total) by mouth daily with breakfast.   empagliflozin (JARDIANCE) 10 MG TABS tablet Take 1 tablet (10 mg total) by mouth daily.   fluticasone (FLONASE) 50 MCG/ACT nasal spray Place 1 spray into both nostrils at bedtime.   Menaquinone-7 (VITAMIN K2 PO) Take 1 tablet by mouth daily.   nitroGLYCERIN (NITROSTAT) 0.4 MG SL tablet Place 1 tablet (0.4 mg total) under the tongue every 5 (five) minutes as needed for chest pain.   pantoprazole (PROTONIX) 40 MG tablet Take 1 tablet (40 mg total) by mouth daily.   Potassium 99 MG TABS Take 99 mg by mouth daily as needed (cramps).   rosuvastatin (CRESTOR) 20 MG tablet Take 1 tablet (20 mg total) by  mouth daily.   sacubitril-valsartan (ENTRESTO) 49-51 MG Take 1 tablet by mouth 2 (two) times daily.   spironolactone (ALDACTONE) 25 MG tablet Take 0.5 tablets (12.5 mg total) by mouth daily.   [DISCONTINUED] indomethacin (INDOCIN) 50 MG capsule Take 50 mg  by mouth 2 (two) times daily as needed (gout flares).   No facility-administered medications prior to visit.   Allergies  Allergen Reactions   Bextra [Valdecoxib] Swelling    Immunization History  Administered Date(s) Administered   Fluad Trivalent(High Dose 65+) 05/27/2023   PNEUMOCOCCAL CONJUGATE-20 05/27/2023   Tdap 10/03/2007    Health Maintenance  Topic Date Due   Medicare Annual Wellness (AWV)  Never done   DTaP/Tdap/Td (2 - Td or Tdap) 10/04/2023 (Originally 10/02/2017)   Zoster Vaccines- Shingrix (1 of 2) 11/23/2023 (Originally 08/20/1974)   COVID-19 Vaccine (1) 03/08/2024 (Originally 08/19/1960)   Colonoscopy  06/29/2026   Pneumonia Vaccine 43+ Years old  Completed   INFLUENZA VACCINE  Completed   Hepatitis C Screening  Completed   HPV VACCINES  Aged Out    Patient Care Team: Eitan Doubleday, Monico Blitz, DO as PCP - General (Family Medicine) Iran Ouch, MD as PCP - Cardiology (Cardiology)       Objective    BP 132/83 (BP Location: Right Arm, Patient Position: Sitting, Cuff Size: Normal)   Pulse 63   Ht 6\' 1"  (1.854 m)   Wt 244 lb 12.8 oz (111 kg)   SpO2 100%   BMI 32.30 kg/m     Physical Exam Vitals and nursing note reviewed.  Constitutional:      General: He is not in acute distress.    Appearance: Normal appearance.  HENT:     Head: Normocephalic and atraumatic.  Eyes:     General: No scleral icterus.    Conjunctiva/sclera: Conjunctivae normal.  Cardiovascular:     Rate and Rhythm: Normal rate.  Pulmonary:     Effort: Pulmonary effort is normal.  Neurological:     Mental Status: He is alert and oriented to person, place, and time. Mental status is at baseline.  Psychiatric:        Mood and  Affect: Mood normal.        Behavior: Behavior normal.     Depression Screen    08/23/2023    2:03 PM  PHQ 2/9 Scores  PHQ - 2 Score 0  PHQ- 9 Score 2   Results for orders placed or performed in visit on 08/23/23  HCV Ab w Reflex to Quant PCR  Result Value Ref Range   HCV Ab Non Reactive Non Reactive  Interpretation:  Result Value Ref Range   HCV Interp 1: Comment     Assessment & Plan     Heart failure with reduced ejection fraction due to coronary artery disease [facility]) Assessment & Plan: Heart failure with recent stent placement in December. Currently under cardiac care with local cardiology. Blood pressure is well-controlled with current medications. Recent switch two weeks ago from Lasix to spironolactone due to gout flare-ups. Discussed potential switch from carvedilol to metoprolol to allow for colchicine use if needed. - Continue carvedilol, spironolactone , Jardiance and Entresto.    Essential hypertension Assessment & Plan: Chronic, stable. Continue current antihypertensives.   Mixed hyperlipidemia Assessment & Plan: He had some leg pain on high-dose rosuvastatin.  He is currently tolerating 20 mg daily.    Stage 3a chronic kidney disease (HCC) Assessment & Plan: Chronic kidney disease stage 3A with slightly reduced renal function. Concerns about medication interactions and potential kidney damage from previous gout treatments. Allopurinol considered with dose adjustment due to kidney function.   Gout involving toe of right foot, unspecified cause, unspecified chronicity Assessment & Plan: Recurrent gout flare-ups, exacerbated by previous use of  Lasix. Currently experiencing a mild flare in the foot and ankle. Concerns about kidney function and potential interactions with current medications. Discussed dietary modifications and potential medication options, including colchicine and allopurinol, with dose adjustments for kidney function. Prefers to minimize  medication use but is open to allopurinol with dose adjustment. - Send prescription for methylprednisolone pack for acute gout flare. - Send prescription for allopurinol with dose adjustment for kidney function.  Orders: -     methylPREDNISolone; Take 6 pills on day 1, 5 pills on day 2, 4 pills on day 3, 3 pills on day 4, 2 pills on day 5, 1 pill on day 6  Dispense: 1 each; Refill: 0 -     Allopurinol; Take 0.5 tablets (50 mg total) by mouth daily.  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 Previously treated with nitroglycerin ointment, which provided relief. Requires a refill. The fissure needs to be healed before addressing hemorrhoids. Referral to gastroenterology is necessary for further evaluation and management. - Send referral to gastroenterology for further evaluation and management of anal fissure and hemorrhoids.  Orders: -     Ambulatory referral to Gastroenterology  Internal hemorrhoids Assessment & Plan: Addressed as noted above.   Orders: -     Ambulatory referral to Gastroenterology  Encounter for hepatitis C screening test for low risk patient -     HCV Ab w Reflex to Quant PCR -     Interpretation:   General Health Maintenance Due for hepatitis C screening and tetanus booster. Declined COVID booster. Last annual wellness visit was approximately 1.5 to 2 years ago. Discussed the importance of hepatitis C screening as it is often asymptomatic until advanced stages. - Order hepatitis C screening. - Discuss tetanus booster with local pharmacy.   Return in about 6 weeks (around 10/04/2023) for mAWV w/AWV nurse if available, then at 1 year for CPE.     I discussed the assessment and treatment plan with the patient  The patient was provided an opportunity to ask questions and all were answered. The patient agreed with the plan and demonstrated an understanding of the instructions.   The  patient was advised to call back or seek an in-person evaluation if the symptoms worsen or if the condition fails to improve as anticipated.    Sherlyn Hay, DO  [facility] [phone] (phone) [phone] (fax)  [facility]

## 2023-08-23 NOTE — Assessment & Plan Note (Signed)
 He had some leg pain on high-dose rosuvastatin.  He is currently tolerating 20 mg daily.

## 2023-08-24 LAB — BASIC METABOLIC PANEL
BUN/Creatinine Ratio: 20 (ref 10–24)
BUN: 24 mg/dL (ref 8–27)
CO2: 21 mmol/L (ref 20–29)
Calcium: 9.2 mg/dL (ref 8.6–10.2)
Chloride: 103 mmol/L (ref 96–106)
Creatinine, Ser: 1.22 mg/dL (ref 0.76–1.27)
Glucose: 88 mg/dL (ref 70–99)
Potassium: 5 mmol/L (ref 3.5–5.2)
Sodium: 140 mmol/L (ref 134–144)
eGFR: 65 mL/min/{1.73_m2} (ref >59)

## 2023-08-24 LAB — LIPID PANEL
Chol/HDL Ratio: 5.1 ratio — ABNORMAL HIGH (ref 0.0–5.0)
Cholesterol, Total: 182 mg/dL (ref 100–199)
HDL: 36 mg/dL — ABNORMAL LOW (ref >39)
LDL Chol Calc (NIH): 114 mg/dL — ABNORMAL HIGH (ref 0–99)
Triglycerides: 183 mg/dL — ABNORMAL HIGH (ref 0–149)
VLDL Cholesterol Cal: 32 mg/dL (ref 5–40)

## 2023-08-24 LAB — CBC
Hematocrit: 48.6 % (ref 37.5–51.0)
Hemoglobin: 16.9 g/dL (ref 13.0–17.7)
MCH: 31.5 pg (ref 26.6–33.0)
MCHC: 34.8 g/dL (ref 31.5–35.7)
MCV: 91 fL (ref 79–97)
Platelets: 203 10*3/uL (ref 150–450)
RBC: 5.36 x10E6/uL (ref 4.14–5.80)
RDW: 14.5 % (ref 11.6–15.4)
WBC: 5.5 10*3/uL (ref 3.4–10.8)

## 2023-08-24 LAB — HCV INTERPRETATION

## 2023-08-24 LAB — HCV AB W REFLEX TO QUANT PCR: HCV Ab: NONREACTIVE

## 2023-08-25 ENCOUNTER — Encounter: Payer: Self-pay | Admitting: Family Medicine

## 2023-08-29 ENCOUNTER — Encounter: Payer: Self-pay | Admitting: Family Medicine

## 2023-08-29 DIAGNOSIS — K648 Other hemorrhoids: Secondary | ICD-10-CM | POA: Insufficient documentation

## 2023-08-29 NOTE — Assessment & Plan Note (Signed)
 Addressed as noted above.

## 2023-08-29 NOTE — Assessment & Plan Note (Signed)
 Chronic kidney disease stage 3A with slightly reduced renal function. Concerns about medication interactions and potential kidney damage from previous gout treatments. Allopurinol considered with dose adjustment due to kidney function.

## 2023-08-29 NOTE — Assessment & Plan Note (Signed)
 Chronic, stable. Continue current antihypertensives.

## 2023-08-29 NOTE — Assessment & Plan Note (Signed)
 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 Previously treated with nitroglycerin ointment, which provided relief. Requires a refill. The fissure needs to be healed before addressing hemorrhoids. Referral to gastroenterology is necessary for further evaluation and management. - Send referral to gastroenterology for further evaluation and management of anal fissure and hemorrhoids.

## 2023-08-29 NOTE — Assessment & Plan Note (Signed)
 Recurrent gout flare-ups, exacerbated by previous use of Lasix. Currently experiencing a mild flare in the foot and ankle. Concerns about kidney function and potential interactions with current medications. Discussed dietary modifications and potential medication options, including colchicine and allopurinol, with dose adjustments for kidney function. Prefers to minimize medication use but is open to allopurinol with dose adjustment. - Send prescription for methylprednisolone pack for acute gout flare. - Send prescription for allopurinol with dose adjustment for kidney function.

## 2023-08-30 ENCOUNTER — Other Ambulatory Visit: Payer: Self-pay | Admitting: Emergency Medicine

## 2023-08-30 MED ORDER — EZETIMIBE 10 MG PO TABS: 10.0000 mg | ORAL_TABLET | Freq: Every day | ORAL | 3 refills | Status: AC

## 2023-09-01 ENCOUNTER — Other Ambulatory Visit: Payer: Self-pay | Admitting: Family Medicine

## 2023-09-01 DIAGNOSIS — K648 Other hemorrhoids: Secondary | ICD-10-CM

## 2023-09-01 DIAGNOSIS — K602 Anal fissure, unspecified: Secondary | ICD-10-CM

## 2023-09-03 ENCOUNTER — Ambulatory Visit: Attending: Student

## 2023-09-03 DIAGNOSIS — I5022 Chronic systolic (congestive) heart failure: Secondary | ICD-10-CM | POA: Insufficient documentation

## 2023-09-03 LAB — ECHOCARDIOGRAM LIMITED
AV Mean grad: 3 mmHg
AV Peak grad: 5.5 mmHg
Ao pk vel: 1.17 m/s
Area-P 1/2: 3.17 cm2
S' Lateral: 4 cm

## 2023-09-03 MED ORDER — PERFLUTREN LIPID MICROSPHERE
1.0000 mL | INTRAVENOUS | Status: AC | PRN
  Administered 2023-09-03: 3 mL via INTRAVENOUS

## 2023-09-06 ENCOUNTER — Encounter: Payer: Self-pay | Admitting: Surgery

## 2023-09-06 ENCOUNTER — Ambulatory Visit (INDEPENDENT_AMBULATORY_CARE_PROVIDER_SITE_OTHER): Payer: Self-pay | Admitting: Surgery

## 2023-09-06 VITALS — BP 101/69 | HR 68 | Temp 97.7°F | Ht 73.0 in | Wt 243.0 lb

## 2023-09-06 DIAGNOSIS — K648 Other hemorrhoids: Secondary | ICD-10-CM | POA: Diagnosis not present

## 2023-09-06 DIAGNOSIS — K602 Anal fissure, unspecified: Secondary | ICD-10-CM | POA: Diagnosis not present

## 2023-09-06 MED ORDER — HYDROCORTISONE ACETATE 25 MG RE SUPP: 25.0000 mg | Freq: Two times a day (BID) | RECTAL | 1 refills | Status: DC

## 2023-09-06 NOTE — Patient Instructions (Addendum)
 We will call in Nifedipine cream at [facility] in [facility] for you to use. Their telephone number is [phone] if you need it.   Advised to pursue a goal of 25 to 30 g of fiber daily.  The majority of this may be through natural sources, advised to ensure a minimal daily fiber supplementation.  Various forms of supplements discussed.  Recommend Psyllium husk, with options of mixing with beverage or applesauce to make more tolerable. Strongly advised to consume more fluids(especially in proximity to fiber intake) and to ensure adequate hydration.   Watch color of urine to determine adequacy of hydration.  Clarity is pursued in urine output, and bowel activity that responds and corresponds to significant meal intake.   We need to avoid deferring having bowel movements, advised to take the time at the first sign of sensation, typically following meals, and in the morning.  The need to avoid more frequently, and the presence of flatus may indicate the need for bowel movement.  Do not defer for later.  Addition of MiraLAX (or its generic equivalent) may be needed ensure at least twice daily bowel movements.  If multiple doses of MiraLAX are necessary utilize them. Never skip a day... Do not tolerate a day without a bowel movement unless you are fasting.  To be regular, we must do the above EVERY day.   Soluble Fiber Dissolves in Water: Soluble fiber dissolves in water to form a gel-like substance. Slows Digestion: This type of fiber slows down digestion, which can help control blood sugar levels and lower cholesterol. Sources: Common sources include oats, beans, apples, citrus fruits, and psyllium. Benefits: Helps manage cholesterol levels. Aids in blood sugar control. Increases healthy gut bacteria, which can lower inflammation and improve digestion.  Insoluble Fiber Does Not Dissolve in Water: Insoluble fiber does not dissolve in water and remains mostly intact as it passes through the digestive  system. Adds Bulk to Stool: It adds bulk to stool, which helps promote regular bowel movements and prevent constipation. Sources: Common sources include whole grains, nuts, beans, and vegetables like cauliflower and potatoes. Benefits: Improves bowel health and regularity. Reduces the risk of colorectal conditions like hemorrhoids and diverticulitis. Supports insulin sensitivity in people with diabetes.  Both types of fiber are essential for overall health, and it's beneficial to include a 50/50 mix of both in your diet.      Constipation, Adult Constipation is when a person has trouble pooping (having a bowel movement). When you have this condition, you may poop fewer than 3 times a week. Your poop (stool) may also be dry, hard, or bigger than normal. Follow these instructions at home: Eating and drinking  Eat foods that have a lot of fiber, such as: Fresh fruits and vegetables. Whole grains. Beans. Eat less of foods that are low in fiber and high in fat and sugar, such as: [place] fries. Hamburgers. Cookies. Candy. Soda. Drink enough fluid to keep your pee (urine) pale yellow. General instructions Exercise regularly or as told by your doctor. Try to do 150 minutes of exercise each week. Go to the restroom when you feel like you need to poop. Do not hold it in. Take over-the-counter and prescription medicines only as told by your doctor. These include any fiber supplements. When you poop: Do deep breathing while relaxing your lower belly (abdomen). Relax your pelvic floor. The pelvic floor is a group of muscles that support the rectum, bladder, and intestines (as well as the uterus in women).  Watch your condition for any changes. Tell your doctor if you notice any. Keep all follow-up visits as told by your doctor. This is important. Contact a doctor if: You have pain that gets worse. You have a fever. You have not pooped for 4 days. You vomit. You are not hungry. You lose  weight. You are bleeding from the opening of the butt (anus). You have thin, pencil-like poop. Get help right away if: You have a fever, and your symptoms suddenly get worse. You leak poop or have blood in your poop. Your belly feels hard or bigger than normal (bloated). You have very bad belly pain. You feel dizzy or you faint. Summary Constipation is when a person poops fewer than 3 times a week, has trouble pooping, or has poop that is dry, hard, or bigger than normal. Eat foods that have a lot of fiber. Drink enough fluid to keep your pee (urine) pale yellow. Take over-the-counter and prescription medicines only as told by your doctor. These include any fiber supplements. This information is not intended to replace advice given to you by your health care provider. Make sure you discuss any questions you have with your health care provider. Document Revised: 04/08/2022 Document Reviewed: 04/08/2022 Elsevier Patient Education  2024 Elsevier Inc.   Hemorrhoids Hemorrhoids are swollen veins that may form: In the butt (rectum). These are called internal hemorrhoids. Around the opening of the butt (anus). These are called external hemorrhoids. Most hemorrhoids do not cause very bad problems. They often get better with changes to your lifestyle and what you eat. What are the causes? Having trouble pooping (constipation) or watery poop (diarrhea). Pushing too hard when you poop. Pregnancy. Being very overweight (obese). Sitting for too long. Riding a bike for a long time. Heavy lifting or other things that take a lot of effort. Anal sex. What are the signs or symptoms? Pain. Itching or soreness in the butt. Bleeding from the butt. Leaking poop. Swelling. One or more lumps around the opening of your butt. How is this treated? In most cases, hemorrhoids can be treated at home. You may be told to: Change what you eat. Make changes to your lifestyle. If these treatments do not  help, you may need to have a procedure done. Your doctor may need to: Place rubber bands at the bottom of the hemorrhoids to make them fall off. Put medicine into the hemorrhoids to shrink them. Shine a type of light on the hemorrhoids to cause them to fall off. Do surgery to get rid of the hemorrhoids. Follow these instructions at home: Medicines Take over-the-counter and prescription medicines only as told by your doctor. Use creams with medicine in them or medicines that you put in your butt as told by your doctor. Eating and drinking  Eat foods that have a lot of fiber in them. These include whole grains, beans, nuts, fruits, and vegetables. Ask your doctor about taking products that have fiber added to them (fibersupplements). Take in less fat. You can do this by: Eating low-fat dairy products. Eating less red meat. Staying away from processed foods. Drink enough fluid to keep your pee (urine) pale yellow. Managing pain and swelling  Take a warm-water bath (sitz bath) for 20 minutes to ease pain. Do this 3-4 times a day. You may do this in a bathtub. You may also use a portable sitz bath that fits over the toilet. If told, put ice on the painful area. It may help to use ice  between your warm baths. Put ice in a plastic bag. Place a towel between your skin and the bag. Leave the ice on for 20 minutes, 2-3 times a day. If your skin turns bright red, take off the ice right away to prevent skin damage. The risk of damage is higher if you cannot feel pain, heat, or cold. General instructions Exercise. Ask your doctor how much and what kind of exercise is best for you. Go to the bathroom when you need to poop. Do not wait. Try not to push too hard when you poop. Keep your butt dry and clean. Use wet toilet paper or moist towelettes after you poop. Do not sit on the toilet for a long time. Contact a doctor if: You have pain and swelling that do not get better with treatment. You have  trouble pooping. You cannot poop. You have pain or swelling outside the area of the hemorrhoids. Get help right away if: You have bleeding from the butt that will not stop. This information is not intended to replace advice given to you by your health care provider. Make sure you discuss any questions you have with your health care provider. Document Revised: 02/04/2022 Document Reviewed: 02/04/2022 Elsevier Patient Education  2024 Elsevier Inc.     Anal Fissure, Adult  An anal fissure is a small tear or crack in the tissue near the opening of the butt (anus). In most cases, bleeding from the tear or crack stops on its own within a few minutes. You may have bleeding each time you poop until the tear or crack heals. What are the causes? Passing a large or hard poop (stool). Having trouble pooping (constipation). Having watery poops (diarrhea). An inflammatory bowel disease, like Crohn's disease or ulcerative colitis. Childbirth. Infections. Anal sex. What are the signs or symptoms? Bleeding from the butt. Small amounts of blood on your poop. The blood coats the outside of the poop. It is not mixed with the poop. Small amounts of blood on the toilet paper or in the toilet after you poop. Pain when you poop. Itching or irritation around your butt. How is this treated? Treatment may include: Making changes to what you eat and drink. This can help if you have trouble pooping. Taking fiber supplements. These can help make your poop soft. Taking warm water baths (sitz baths). These can help heal the tear. Using creams and ointments. If other treatments do not work, you may need: A shot near the tear or crack (botulinum injection). Surgery to fix the tear or crack. Follow these instructions at home: Medicines Take over-the-counter and prescription medicines only as told by your doctor. This includes creams and ointments that have medicine in them. Use medicines to make your poop  soft as told by your doctor. Treating constipation You may need to take these actions to prevent or treat trouble pooping: Drink enough fluid to keep your pee (urine) pale yellow. Eat foods that are high in fiber. These include beans, whole grains, and fresh fruits and vegetables. Stay away from unripe bananas. Ripe bananas are a good choice. Limit foods that are high in fat and sugar. These include fried or sweet foods. Avoid dairy products. This includes milk.  General instructions  Keep the butt area clean and dry. Take a warm water bath as told by your doctor. Do not use soap. Contact a doctor if: You have more bleeding. You have a fever. You have watery poop that is mixed with blood. Your pain  does not go away. Your problems get worse. This information is not intended to replace advice given to you by your health care provider. Make sure you discuss any questions you have with your health care provider. Document Revised: 06/11/2022 Document Reviewed: 06/11/2022 Elsevier Patient Education  2024 [employer].

## 2023-09-06 NOTE — Progress Notes (Signed)
 09/06/2023  Reason for Visit:  Anal fissure and internal hemorrhoids  Requesting Provider:  Jacquenette Shone, DO  History of Present Illness: Jeff Warren is a 68 y.o. male presenting for evaluation of an anal fissure and internal hemorrhoids.  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  He has been dealing with these since he was probably a teenager.  In the past, he has used nitroglycerin ointment to help with the fissure but the prescription ran out long time ago.  He also reports having some side effect of headaches with this.  He also has issues with constipation and feels that he is passing "rocks" when having bowel movements.  The last few months, he has been dealing more specifically with the anal fissure flareup and reports perianal pain with bowel movements.  Occasionally there may be some blood in the stool.  He also feels tissue protruding out with bowel movements but he is able to push them back in.  Of note, the patient reports having a history of a heart attack in 2018 and underwent catheterization with stent placement.  Most recently he had a second catheterization and December 2024 during which she had an additional stent placed.  He is currently on aspirin and Plavix.  Past Medical History: Past Medical History:  Diagnosis Date   Aortic root dilation (HCC)    a.) TTE 02/26/2021: mild; measured 40 mm.   BPH (benign prostatic hyperplasia)    CAD (coronary artery disease)    a. inferior ST elevation MI 12/05/16: cardiac cath LM nl, p-mLAD 80%, D1 CTO with right-to-left collats, LCx minor irregs,  m-dRCA 30%, dRCA 100% s/p PCI/DES, EF 45-50%, severely elevated LVEDP; b. staged PCI 12/23/16 - successful PCI/DES to p-mLAD with small diag being jailed by stent (vessel known to be subtotally occluded with R-L collats)   Dyspnea    Family history of premature CAD    a. sister passed from MI at age 18   GERD (gastroesophageal reflux disease)    Gout     HLD (hyperlipidemia)    Hypertension    LAFB (left anterior fascicular block)    Severe sepsis (HCC) 12/03/2020   ST elevation myocardial infarction (STEMI) of inferior wall (HCC) 12/05/2016   a.) LHC --> EF 45% with severely elevated LVEDP; 2v CAD; culprit was thrombotic occlusion of dRCA; D1 occluded with R-L collaterals; 80% p-mLAD. PCI performed placing a 3.5 x 15 mm Resolute Onyx DES x 1 to dRCA. Plans for staged PCI of LAD.   Systolic dysfunction    a.) TTE 12/05/16: EF 50-55%, inf HK, GR1DD, mildly dilated LA. b.) TTE 02/26/21: EF 45-50%, inferolateral HK, mild MR, G2DD.   Urinary retention      Past Surgical History: Past Surgical History:  Procedure Laterality Date   ABDOMINOPLASTY     CATARACT EXTRACTION Bilateral    COLONOSCOPY WITH PROPOFOL N/A 06/29/2016   Procedure: COLONOSCOPY WITH PROPOFOL;  Surgeon: Charolett Bumpers, MD;  Location: [facility];  Service: Endoscopy;  Laterality: N/A;   CORONARY BALLOON ANGIOPLASTY N/A 05/26/2023   Procedure: CORONARY BALLOON ANGIOPLASTY;  Surgeon: Iran Ouch, MD;  Location: [facility];  Service: Cardiovascular;  Laterality: N/A;   CORONARY PRESSURE/FFR STUDY N/A 05/26/2023   Procedure: CORONARY PRESSURE/FFR STUDY;  Surgeon: Iran Ouch, MD;  Location: [facility];  Service: Cardiovascular;  Laterality: N/A;   CORONARY STENT INTERVENTION N/A 12/23/2016   Procedure: Coronary Stent Intervention;  Surgeon: Kirke Corin,  Chelsea Aus, MD;  Location: [facility];  Service: Cardiovascular;  Laterality: N/A;   CORONARY STENT INTERVENTION N/A 05/26/2023   Procedure: CORONARY STENT INTERVENTION;  Surgeon: Iran Ouch, MD;  Location: [facility];  Service: Cardiovascular;  Laterality: N/A;   CORONARY/GRAFT ACUTE MI REVASCULARIZATION N/A 12/05/2016   Procedure: Coronary/Graft Acute MI Revascularization;  Surgeon: Iran Ouch, MD;  Location: [facility];  Service: Cardiovascular;  Laterality: N/A;    ganglion cyst removal Right    GAS INSERTION Left 11/21/2020   Procedure: INSERTION OF GAS LEFT EYE (C3F8);  Surgeon: Stephannie Li, MD;  Location: [facility];  Service: Ophthalmology;  Laterality: Left;   GAS/FLUID EXCHANGE Left 11/21/2020   Procedure: GAS/FLUID EXCHANGE;  Surgeon: Stephannie Li, MD;  Location: [facility] OR;  Service: Ophthalmology;  Laterality: Left;   HOLEP-LASER ENUCLEATION OF THE PROSTATE WITH MORCELLATION N/A 03/25/2021   Procedure: HOLEP-LASER ENUCLEATION OF THE PROSTATE WITH MORCELLATION;  Surgeon: Sondra Come, MD;  Location: [facility];  Service: Urology;  Laterality: N/A;   LEFT HEART CATH AND CORONARY ANGIOGRAPHY N/A 12/05/2016   Procedure: Left Heart Cath and Coronary Angiography;  Surgeon: Iran Ouch, MD;  Location: [facility];  Service: Cardiovascular;  Laterality: N/A;   NO PAST SURGERIES     Has has some cosmetic surgeries   RETINAL DETACHMENT SURGERY     RIGHT/LEFT HEART CATH AND CORONARY ANGIOGRAPHY N/A 05/26/2023   Procedure: RIGHT/LEFT HEART CATH AND CORONARY ANGIOGRAPHY;  Surgeon: Iran Ouch, MD;  Location: [facility];  Service: Cardiovascular;  Laterality: N/A;   VITRECTOMY 25 GAUGE WITH SCLERAL BUCKLE Left 11/21/2020   Procedure: VITRECTOMY 25 GAUGE WITH SCLERAL BUCKLE;  Surgeon: Stephannie Li, MD;  Location: [facility] OR;  Service: Ophthalmology;  Laterality: Left;    Home Medications: Prior to Admission medications   Medication Sig Start Date End Date Taking? Authorizing Provider  hydrocortisone (ANUSOL-HC) 25 MG suppository Place 1 suppository (25 mg total) rectally 2 (two) times daily. 09/06/23  Yes Cydnie Deason, Elita Quick, MD  allopurinol (ZYLOPRIM) 100 MG tablet Take 0.5 tablets (50 mg total) by mouth daily. 08/23/23   Sherlyn Hay, DO  aspirin EC 81 MG tablet Take 81 mg by mouth daily. Swallow whole.    [provider]  Carboxymethylcell-Hypromellose (GENTEAL OP) Place 1 drop into both eyes daily as needed (dry eyes).     [provider]  carvedilol (COREG) 6.25 MG tablet Take 1 tablet (6.25 mg total) by mouth 2 (two) times daily. 04/29/23   Iran Ouch, MD  Cholecalciferol (DIALYVITE VITAMIN D 5000) 125 MCG (5000 UT) capsule Take 5,000 Units by mouth daily.    [provider]  clopidogrel (PLAVIX) 75 MG tablet Take 1 tablet (75 mg total) by mouth daily with breakfast. 08/09/23   [name], NP  empagliflozin (JARDIANCE) 10 MG TABS tablet Take 1 tablet (10 mg total) by mouth daily. 06/04/23   Iran Ouch, MD  ezetimibe (ZETIA) 10 MG tablet Take 1 tablet (10 mg total) by mouth daily. 08/30/23 11/28/23  [name], NP  fluticasone (FLONASE) 50 MCG/ACT nasal spray Place 1 spray into both nostrils at bedtime.    [provider]  Menaquinone-7 (VITAMIN K2 PO) Take 1 tablet by mouth daily.    [provider]  nitroGLYCERIN (NITROSTAT) 0.4 MG SL tablet Place 1 tablet (0.4 mg total) under the tongue every 5 (five) minutes as needed for chest pain. 05/27/23 05/26/24  Parcells, Therisa Doyne,  PA-C  pantoprazole (PROTONIX) 40 MG tablet Take 1 tablet (40 mg total) by mouth daily. 05/27/23 05/26/24  Parcells, Therisa Doyne, PA-C  rosuvastatin (CRESTOR) 20 MG tablet Take 1 tablet (20 mg total) by mouth daily. 08/09/23   [name], NP  sacubitril-valsartan (ENTRESTO) 49-51 MG Take 1 tablet by mouth 2 (two) times daily. 06/04/23   Iran Ouch, MD  spironolactone (ALDACTONE) 25 MG tablet Take 0.5 tablets (12.5 mg total) by mouth daily. 08/09/23 11/07/23  [name], NP    Allergies: Allergies  Allergen Reactions   Bextra [Valdecoxib] Swelling    Social History:  reports that he has never smoked. He has never been exposed to tobacco smoke. He has never used smokeless tobacco. He reports that he does not drink alcohol and does not use drugs.   Family History: Family History  Problem Relation Age of Onset   Diabetes Mellitus II Mother    Cancer Father     Diabetes Mellitus II Sister    Diabetes Mellitus II Brother    Kidney failure Brother     Review of Systems: Review of Systems  Constitutional:  Negative for chills and fever.  Respiratory:  Negative for shortness of breath.   Cardiovascular:  Negative for chest pain.  Gastrointestinal:  Positive for blood in stool and constipation. Negative for nausea and vomiting.       Perianal pain with bowel movement    Physical Exam BP 101/69   Pulse 68   Temp 97.7 F (36.5 C) (Oral)   Ht 6\' 1"  (1.854 m)   Wt 243 lb (110.2 kg)   SpO2 100%   BMI 32.06 kg/m  CONSTITUTIONAL: No acute distress, well-nourished HEENT:  Normocephalic, atraumatic, extraocular motion intact. RESPIRATORY:  Normal respiratory effort without pathologic use of accessory muscles. CARDIOVASCULAR: Regular rhythm and rate RECTAL: External exam reveals enlarged external hemorrhoid components of all 3 columns as well as a posterior anal fissure.  No current bleeding at this point.  Digital rectal exam was difficult due to discomfort but I was unable to palpate the internal hemorrhoid components. MUSCULOSKELETAL:  Normal muscle strength and tone in all four extremities.  No peripheral edema or cyanosis. NEUROLOGIC:  Motor and sensation is grossly normal.  Cranial nerves are grossly intact. PSYCH:  Alert and oriented to person, place and time. Affect is normal.  Laboratory Analysis: Labs from 08/23/2023: Sodium 140, potassium 5, chloride 103, CO2 21, BUN 24, creatinine 1.22.  WBC 5.5, hemoglobin 16.9, hematocrit 48.6, platelets 203.  Imaging: No results found.  Assessment and Plan: This is a 68 y.o. male with an anal fissure and internal/external hemorrhoids.  - Discussed with the patient that on exam, he does have a posterior anal fissure.  There is no bleeding noted from this area but the patient does report tenderness with bowel movements and on palpation today.  Discussed with him that for this we will start him on  nifedipine compound ointment to help relax the anal sphincter muscle and allow improved blood flow to the tissues so they can heal.  Discussed with him that we also need to work on his constipation, otherwise the fissure will never heal.  I have recommended that he start taking daily MiraLAX as well as increase fiber intake with supplementation, and drink plenty of fluids.  He can also do sitz bath's to help with any pain control for the fissure. - With regards to the hemorrhoids, he does have enlarged hemorrhoids on external exam and per prior reports,  also has enlarged internal hemorrhoids.  Discussed with him that for now, we can manage any potential hemorrhoid issues symptomatically and will give him a prescription for Anusol suppositories.  Discussed with him the eventual goal would be to excise the hemorrhoids but I would want the anal fissure to be fully healed prior to that.  Also he is currently on both aspirin and Plavix and will need to defer until he is able to come off Plavix in order to do any surgeries for his hemorrhoids.  The patient will reach out to his cardiology team to see how long he will need to be on dual antiplatelet therapy. - Follow-up in 1 month to reassess his progress.  I spent 30 minutes dedicated to the care of this patient on the date of this encounter to include pre-visit review of records, face-to-face time with the patient discussing diagnosis and management, and any post-visit coordination of care.   Howie Ill, MD [facility]

## 2023-09-10 ENCOUNTER — Other Ambulatory Visit: Payer: Self-pay | Admitting: Emergency Medicine

## 2023-09-10 MED ORDER — METOPROLOL SUCCINATE ER 50 MG PO TB24: 50.0000 mg | ORAL_TABLET | Freq: Every day | ORAL | 3 refills | Status: AC

## 2023-09-12 ENCOUNTER — Encounter: Payer: Self-pay | Admitting: Family Medicine

## 2023-09-14 ENCOUNTER — Telehealth: Admitting: Physician Assistant

## 2023-09-14 DIAGNOSIS — M1A0790 Idiopathic chronic gout, unspecified ankle and foot, without tophus (tophi): Secondary | ICD-10-CM | POA: Diagnosis not present

## 2023-09-14 MED ORDER — COLCHICINE 0.6 MG PO TABS
ORAL_TABLET | ORAL | 0 refills | Status: DC
Start: 2023-09-14 — End: 2023-10-04

## 2023-09-14 NOTE — Progress Notes (Signed)
 E-Visit for Gout Symptoms  We are sorry that you are not feeling well. We are here to help!  Based on what you shared with me it looks like you have a flare of your gout.  Gout is a form of arthritis. It can cause pain and swelling in the joints. At first, it tends to affect only 1 joint - most frequently the big toe. It happens in people who have too much uric acid in the blood. Uric acid is a chemical that is produced when the body breaks down certain foods. Uric acid can form sharp needle-like crystals that build up in the joints and cause pain. Uric acid crystals can also form inside the tubes that carry urine from the kidneys to the bladder. These crystals can turn into "kidney stones" that can cause pain and problems with the flow of urine. People with gout get sudden "flares" or attacks of severe pain, most often the big toe, ankle, or knee. Often the joint also turns red and swells. Usually, only 1 joint is affected, but some people have pain in more than 1 joint. Gout flares tend to happen more often during the night.  The pain from gout can be extreme. The pain and swelling are worst at the beginning of a gout flare. The symptoms then get better within a few days to weeks. It is not clear how the body "turns off" a gout flare.  Do not start any NEW preventative medicine until the gout has cleared completely. However, If you are already on Probenecid or Allopurinol for CHRONIC gout, you may continue taking this during an active flare up  I have prescribed Colchicine 0.6 mg tabs - Take 2 tabs immediately, then 1 tab twice per day for the duration of the flare up to a max of 7 days (but discontinue for stomach pains or diarrhea)    HOME CARE Losing weight can help relieve gout. It's not clear that following a specific diet plan will help with gout symptoms but eating a balanced diet can help improve your overall health. It can also help you lose weight, if you are overweight. In general, a  healthy diet includes plenty of fruits, vegetables, whole grains, and low-fat dairy products (labelled "low fat", skim, 2%). Avoid sugar sweetened drinks (including sodas, tea, juice and juice blends, coffee drinks and sports drinks) Limit alcohol to 1-2 drinks of beer, spirits or wine daily these can make gout flares worse. Some people with gout also have other health problems, such as heart disease, high blood pressure, kidney disease, or obesity. If you have any of these issues, it's important to work with your doctor to manage them. This can help improve your overall health and might also help with your gout.  GET HELP RIGHT AWAY IF: Your symptoms persist after you have completed your treatment plan You develop severe diarrhea You develop abnormal sensations  You develop vomiting,   You develop weakness  You develop abdominal pain  FOLLOW UP WITH YOUR PRIMARY PROVIDER IF: If your symptoms do not improve within 10 days  MAKE SURE YOU  Understand these instructions. Will watch your condition. Will get help right away if you are not doing well or get worse.  Thank you for choosing an e-visit.  Your e-visit answers were reviewed by a board certified advanced clinical practitioner to complete your personal care plan. Depending upon the condition, your plan could have included both over the counter or prescription medications.  Please review your  pharmacy choice. Make sure the pharmacy is open so you can pick up prescription now. If there is a problem, you may contact your provider through [employer] and have the prescription routed to another pharmacy.  Your safety is important to [place]. If you have drug allergies check your prescription carefully.   For the next 24 hours you can use MyChart to ask questions about today's visit, request a non-urgent call back, or ask for a work or school excuse. You will get an email in the next two days asking about your experience. I hope that your  e-visit has been valuable and will speed your recovery.    I have spent 5 minutes in review of e-visit questionnaire, review and updating patient chart, medical decision making and response to patient.   Margaretann Loveless, PA-C

## 2023-09-17 ENCOUNTER — Other Ambulatory Visit: Payer: Self-pay | Admitting: Cardiovascular Disease

## 2023-10-04 ENCOUNTER — Ambulatory Visit (INDEPENDENT_AMBULATORY_CARE_PROVIDER_SITE_OTHER): Admitting: Surgery

## 2023-10-04 ENCOUNTER — Encounter: Payer: Self-pay | Admitting: Surgery

## 2023-10-04 VITALS — BP 124/83 | HR 76 | Temp 98.1°F | Ht 73.0 in | Wt 241.4 lb

## 2023-10-04 DIAGNOSIS — K644 Residual hemorrhoidal skin tags: Secondary | ICD-10-CM

## 2023-10-04 DIAGNOSIS — K648 Other hemorrhoids: Secondary | ICD-10-CM

## 2023-10-04 DIAGNOSIS — K602 Anal fissure, unspecified: Secondary | ICD-10-CM | POA: Diagnosis not present

## 2023-10-04 NOTE — Patient Instructions (Addendum)
 Please call the office if you have any questions or concerns  You should have a refill of nifedipine at the pharmacy, if you don't, call us  and we can call in another refill.    Call your Cardiologist to ask when you can stop your Plavix  for a non urgent surgical procedure that will be done in the operating room.     Advised to pursue a goal of 25 to 30 g of fiber daily.  The majority of this may be through natural sources, advised to ensure a minimal daily fiber supplementation.  Various forms of supplements discussed.  Recommend Psyllium husk, with options of mixing with beverage or applesauce to make more tolerable. Strongly advised to consume more fluids(especially in proximity to fiber intake) and to ensure adequate hydration.   Watch color of urine to determine adequacy of hydration.  Clarity is pursued in urine output, and bowel activity that responds and corresponds to significant meal intake.   We need to avoid deferring having bowel movements, advised to take the time at the first sign of sensation, typically following meals, and in the morning.  The need to avoid more frequently, and the presence of flatus may indicate the need for bowel movement.  Do not defer for later.  Addition of MiraLAX (or its generic equivalent) may be needed ensure at least twice daily bowel movements.  If multiple doses of MiraLAX are necessary utilize them. Never skip a day... Do not tolerate a day without a bowel movement unless you are fasting.  To be regular, we must do the above EVERY day.   Soluble Fiber Dissolves in Water : Soluble fiber dissolves in water  to form a gel-like substance. Slows Digestion: This type of fiber slows down digestion, which can help control blood sugar levels and lower cholesterol. Sources: Common sources include oats, beans, apples, citrus fruits, and psyllium. Benefits: Helps manage cholesterol levels. Aids in blood sugar control. Increases healthy gut bacteria, which can  lower inflammation and improve digestion.  Insoluble Fiber Does Not Dissolve in Water : Insoluble fiber does not dissolve in water  and remains mostly intact as it passes through the digestive system. Adds Bulk to Stool: It adds bulk to stool, which helps promote regular bowel movements and prevent constipation. Sources: Common sources include whole grains, nuts, beans, and vegetables like cauliflower and potatoes. Benefits: Improves bowel health and regularity. Reduces the risk of colorectal conditions like hemorrhoids and diverticulitis. Supports insulin sensitivity in people with diabetes.  Both types of fiber are essential for overall health, and it's beneficial to include a 50/50 mix of both in your diet.

## 2023-10-04 NOTE — Progress Notes (Signed)
 10/04/2023  History of Present Illness: Jeff Warren is a 68 y.o. male presenting for follow up of a posterior anal fissure and internal/external hemorrhoids.  He was last seen on 09/06/23 and was then started on Nifedipine compound for the fissure.  Today, he reports that both the perianal pain and bleeding have improved.  He did have an episode of constipation this past weekend which resulted in increased pain/bleeding, but this has overall been improving since his last visit.  The pain is not excruciating anymore.  He also is working on constipation and taking Miralax.    Past Medical History: Past Medical History:  Diagnosis Date   Aortic root dilation (HCC)    a.) TTE 02/26/2021: mild; measured 40 mm.   BPH (benign prostatic hyperplasia)    CAD (coronary artery disease)    a. inferior ST elevation MI 12/05/16: cardiac cath LM nl, p-mLAD 80%, D1 CTO with right-to-left collats, LCx minor irregs,  m-dRCA 30%, dRCA 100% s/p PCI/DES, EF 45-50%, severely elevated LVEDP; b. staged PCI 12/23/16 - successful PCI/DES to p-mLAD with small diag being jailed by stent (vessel known to be subtotally occluded with R-L collats)   Dyspnea    Family history of premature CAD    a. sister passed from MI at age 9   GERD (gastroesophageal reflux disease)    Gout    HLD (hyperlipidemia)    Hypertension    LAFB (left anterior fascicular block)    Severe sepsis (HCC) 12/03/2020   ST elevation myocardial infarction (STEMI) of inferior wall (HCC) 12/05/2016   a.) LHC --> EF 45% with severely elevated LVEDP; 2v CAD; culprit was thrombotic occlusion of dRCA; D1 occluded with R-L collaterals; 80% p-mLAD. PCI performed placing a 3.5 x 15 mm Resolute Onyx DES x 1 to dRCA. Plans for staged PCI of LAD.   Systolic dysfunction    a.) TTE 12/05/16: EF 50-55%, inf HK, GR1DD, mildly dilated LA. b.) TTE 02/26/21: EF 45-50%, inferolateral HK, mild MR, G2DD.   Urinary retention      Past Surgical History: Past Surgical History:   Procedure Laterality Date   ABDOMINOPLASTY     CATARACT EXTRACTION Bilateral    COLONOSCOPY WITH PROPOFOL  N/A 06/29/2016   Procedure: COLONOSCOPY WITH PROPOFOL ;  Surgeon: Garrett Kallman, MD;  Location: [facility];  Service: Endoscopy;  Laterality: N/A;   CORONARY BALLOON ANGIOPLASTY N/A 05/26/2023   Procedure: CORONARY BALLOON ANGIOPLASTY;  Surgeon: Wenona Hamilton, MD;  Location: [facility];  Service: Cardiovascular;  Laterality: N/A;   CORONARY PRESSURE/FFR STUDY N/A 05/26/2023   Procedure: CORONARY PRESSURE/FFR STUDY;  Surgeon: Wenona Hamilton, MD;  Location: [facility];  Service: Cardiovascular;  Laterality: N/A;   CORONARY STENT INTERVENTION N/A 12/23/2016   Procedure: Coronary Stent Intervention;  Surgeon: Wenona Hamilton, MD;  Location: [facility];  Service: Cardiovascular;  Laterality: N/A;   CORONARY STENT INTERVENTION N/A 05/26/2023   Procedure: CORONARY STENT INTERVENTION;  Surgeon: Wenona Hamilton, MD;  Location: [facility];  Service: Cardiovascular;  Laterality: N/A;   CORONARY/GRAFT ACUTE MI REVASCULARIZATION N/A 12/05/2016   Procedure: Coronary/Graft Acute MI Revascularization;  Surgeon: Wenona Hamilton, MD;  Location: [facility];  Service: Cardiovascular;  Laterality: N/A;   ganglion cyst removal Right    GAS INSERTION Left 11/21/2020   Procedure: INSERTION OF GAS LEFT EYE (C3F8);  Surgeon: Linard Reno, MD;  Location: [facility];  Service: Ophthalmology;  Laterality: Left;   GAS/FLUID  EXCHANGE Left 11/21/2020   Procedure: GAS/FLUID EXCHANGE;  Surgeon: Linard Reno, MD;  Location: [facility] OR;  Service: Ophthalmology;  Laterality: Left;   HOLEP-LASER ENUCLEATION OF THE PROSTATE WITH MORCELLATION N/A 03/25/2021   Procedure: HOLEP-LASER ENUCLEATION OF THE PROSTATE WITH MORCELLATION;  Surgeon: Lawerence Pressman, MD;  Location: [facility] ORS;  Service: Urology;  Laterality: N/A;   LEFT HEART CATH AND CORONARY ANGIOGRAPHY N/A 12/05/2016    Procedure: Left Heart Cath and Coronary Angiography;  Surgeon: Wenona Hamilton, MD;  Location: [facility];  Service: Cardiovascular;  Laterality: N/A;   NO PAST SURGERIES     Has has some cosmetic surgeries   RETINAL DETACHMENT SURGERY     RIGHT/LEFT HEART CATH AND CORONARY ANGIOGRAPHY N/A 05/26/2023   Procedure: RIGHT/LEFT HEART CATH AND CORONARY ANGIOGRAPHY;  Surgeon: Wenona Hamilton, MD;  Location: [facility];  Service: Cardiovascular;  Laterality: N/A;   VITRECTOMY 25 GAUGE WITH SCLERAL BUCKLE Left 11/21/2020   Procedure: VITRECTOMY 25 GAUGE WITH SCLERAL BUCKLE;  Surgeon: Linard Reno, MD;  Location: [facility] OR;  Service: Ophthalmology;  Laterality: Left;    Home Medications: Prior to Admission medications   Medication Sig Start Date End Date Taking? Authorizing Provider  allopurinol  (ZYLOPRIM ) 100 MG tablet Take 0.5 tablets (50 mg total) by mouth daily. 08/23/23   Carlean Charter, DO  aspirin  EC 81 MG tablet Take 81 mg by mouth daily. Swallow whole.    [provider]  Carboxymethylcell-Hypromellose (GENTEAL OP) Place 1 drop into both eyes daily as needed (dry eyes).    [provider]  Cholecalciferol  (DIALYVITE VITAMIN D  5000) 125 MCG (5000 UT) capsule Take 5,000 Units by mouth daily.    [provider]  clopidogrel  (PLAVIX ) 75 MG tablet Take 1 tablet by mouth once daily with breakfast 09/20/23   Wenona Hamilton, MD  empagliflozin  (JARDIANCE ) 10 MG TABS tablet Take 1 tablet (10 mg total) by mouth daily. 06/04/23   Wenona Hamilton, MD  ezetimibe  (ZETIA ) 10 MG tablet Take 1 tablet (10 mg total) by mouth daily. 08/30/23 11/28/23  Morey Ar, NP  fluticasone  (FLONASE ) 50 MCG/ACT nasal spray Place 1 spray into both nostrils at bedtime.    [provider]  Menaquinone-7 (VITAMIN K2 PO) Take 1 tablet by mouth daily.    [provider]  metoprolol  succinate (TOPROL -XL) 50 MG 24 hr tablet Take 1 tablet (50 mg total) by mouth  daily. Take with or immediately following a meal. 09/10/23 12/09/23  Morey Ar, NP  nitroGLYCERIN  (NITROSTAT ) 0.4 MG SL tablet Place 1 tablet (0.4 mg total) under the tongue every 5 (five) minutes as needed for chest pain. 05/27/23 05/26/24  Parcells, Cyrilla Drivers, PA-C  pantoprazole  (PROTONIX ) 40 MG tablet Take 1 tablet (40 mg total) by mouth daily. 05/27/23 05/26/24  Parcells, Cyrilla Drivers, PA-C  rosuvastatin  (CRESTOR ) 20 MG tablet Take 1 tablet (20 mg total) by mouth daily. 08/09/23   Morey Ar, NP  sacubitril -valsartan  (ENTRESTO ) 49-51 MG Take 1 tablet by mouth 2 (two) times daily. 06/04/23   Wenona Hamilton, MD  spironolactone  (ALDACTONE ) 25 MG tablet Take 0.5 tablets (12.5 mg total) by mouth daily. 08/09/23 11/07/23  Morey Ar, NP    Allergies: Allergies  Allergen Reactions   Bextra [Valdecoxib] Swelling    Review of Systems: Review of Systems  Constitutional:  Negative for chills and fever.  Respiratory:  Negative for shortness of breath.   Cardiovascular:  Negative for chest pain.  Gastrointestinal:  Negative for  abdominal pain, constipation, nausea and vomiting.    Physical Exam BP 124/83   Pulse 76   Temp 98.1 F (36.7 C) (Oral)   Ht 6\' 1"  (1.854 m)   Wt 241 lb 6.4 oz (109.5 kg)   SpO2 99%   BMI 31.85 kg/m  CONSTITUTIONAL: No acute distress, well nourished. HEENT:  Normocephalic, atraumatic, extraocular motion intact. RESPIRATORY:  Normal respiratory effort without pathologic use of accessory muscles. CARDIOVASCULAR: Regular rhythm and rate. RECTAL:  External exam reveals enlarged external hemorrhoids of all three areas but without any inflammatory changes.  The patient has still a posterior anal fistula which is about 5 mm width, appears stable.  On digital rectal exam, the sphincter tone is improving, with some discomfort on exam, but much better than his last exam.  Appears to have enlarged internal posterior hemorrhoid as well. Mild bleeding noted  during exam likely from irritation to the fissure. NEUROLOGIC:  Motor and sensation is grossly normal.  Cranial nerves are grossly intact. PSYCH:  Alert and oriented to person, place and time. Affect is normal.   Assessment and Plan: This is a 68 y.o. male with posterior anal fissure and enlarged internal/external hemorrhoids.  --Discussed with the patient that for now the Nifedipine treatment is helping and the sphincter tone is improving.  This will allow the fissure to start healing more easily.  He does have enlarged internal and external hemorrhoids, but I think the main issue is truly his fissure.   --As the fissure heals, we can deal with the hemorrhoid issue, but for now would continue Nifedipine as that is working. --Asked the patient to check with his cardiology team about when it would be feasible to go under general anesthesia for a non-urgent surgery and when would be appropriate to come off Plavix  for it.  This would be in case he needs botox injection and/or hemorrhoidectomy in the future. --Advised to continue taking Miralax daily.  He also needs to drink more fluids and he can also add fiber. --Follow up in a month.  I spent 20 minutes dedicated to the care of this patient on the date of this encounter to include pre-visit review of records, face-to-face time with the patient discussing diagnosis and management, and any post-visit coordination of care.   Marene Shape, MD  [facility]

## 2023-10-06 ENCOUNTER — Ambulatory Visit: Admitting: Family Medicine

## 2023-10-06 ENCOUNTER — Encounter: Payer: Self-pay | Admitting: Family Medicine

## 2023-10-06 VITALS — BP 127/93 | HR 60 | Ht 73.0 in | Wt 244.5 lb

## 2023-10-06 DIAGNOSIS — I10 Essential (primary) hypertension: Secondary | ICD-10-CM

## 2023-10-06 DIAGNOSIS — M109 Gout, unspecified: Secondary | ICD-10-CM | POA: Diagnosis not present

## 2023-10-06 DIAGNOSIS — Z0000 Encounter for general adult medical examination without abnormal findings: Secondary | ICD-10-CM | POA: Insufficient documentation

## 2023-10-06 DIAGNOSIS — Z0001 Encounter for general adult medical examination with abnormal findings: Secondary | ICD-10-CM

## 2023-10-06 MED ORDER — ALLOPURINOL 100 MG PO TABS: 50.0000 mg | ORAL_TABLET | Freq: Every day | ORAL | 3 refills | Status: AC

## 2023-10-06 MED ORDER — COLCHICINE 0.6 MG PO TABS: ORAL_TABLET | ORAL | 2 refills | Status: DC

## 2023-10-06 NOTE — Progress Notes (Signed)
 Annual Wellness Visit     Patient: Jeff Warren, Male    DOB: 23-Feb-1956, 69 y.o.   MRN: [id number] Visit Date: 10/06/2023  Today's Provider: Carlean Charter, DO   Chief Complaint  Patient presents with   Annual Exam    Diet -  No red meats or pork, healthy Exercise - not much Feeling - fairly well Sleeping - fairly well Concerns -  gout feels like it is going to flare again would like a refill on colchine 0.06 that was prescribed during evisit   Subjective    Jeff Warren is a 68 y.o. male who presents today for his Annual Wellness Visit.  HPI Jeff Warren is a 68 year old male with gout who presents for a refill of colchicine .  He prefers to take colchicine  at the onset of symptoms to minimize the duration and severity of gout attacks. He experiences severe pain in both knees, ankles, and feet during flares, which can render him bedridden for about a week. Taking colchicine  early helps manage the symptoms effectively, reducing the need for prolonged use. He describes a recent episode of knee tenderness indicating an impending gout attack, for which he took one colchicine  tablet yesterday, alleviating the symptoms. He has three tablets left from a previous prescription and seeks a refill to manage future flares.  He is currently taking allopurinol  at a dose of half a tablet once daily. He mentions that starting allopurinol  initially caused a flare-up. He previously took Probenecid but discontinued it years ago. He also experienced a flare-up when he was on Lasix , which was later changed to spironolactone . He acknowledges not drinking enough water , which he believes may contribute to his gout and kidney function issues.  He is on multiple medications including spironolactone , Entresto , rosuvastatin , over-the-counter omeprazole , metoprolol , Flonase , ezetimibe , Jardiance , Plavix , vitamin D , and aspirin . He also uses a compounded nifedipine and lidocaine  cream for an anal fissure and  hemorrhoids, applying it three times a day as needed.  He has a history of high blood pressure and mentions a past issue with secondary hypothyroidism, which required Synthroid treatment for several months. He describes symptoms such as cold intolerance, brittle nails, and hair loss during that time. He also notes occasional difficulty with word retrieval, which he attributes to past thyroid issues and wonders about possible mini-strokes.  He has a history of pulmonary edema and had a cardiac stent placed, which led to changes in his diuretic medication. No current swelling in legs and no recent use of nitroglycerin . 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    Medications: Outpatient Medications Prior to Visit  Medication Sig   aspirin  EC 81 MG tablet Take 81 mg by mouth daily. Swallow whole.   Carboxymethylcell-Hypromellose (GENTEAL OP) Place 1 drop into both eyes daily as needed (dry eyes).   Cholecalciferol  (DIALYVITE VITAMIN D  5000) 125 MCG (5000 UT) capsule Take 5,000 Units by mouth daily.   clopidogrel  (PLAVIX ) 75 MG tablet Take 1 tablet by mouth once daily with breakfast   empagliflozin  (JARDIANCE ) 10 MG TABS tablet Take 1 tablet (10 mg total) by mouth daily.   ezetimibe  (ZETIA ) 10 MG tablet Take 1 tablet (10 mg total) by mouth daily.   fluticasone  (FLONASE ) 50 MCG/ACT nasal spray Place 1 spray into both nostrils at bedtime.   Menaquinone-7 (VITAMIN K2 PO) Take 1 tablet by mouth daily.   metoprolol  succinate (TOPROL -XL) 50 MG  24 hr tablet Take 1 tablet (50 mg total) by mouth daily. Take with or immediately following a meal.   NIFEdipine Micronized POWD by Does not apply route in the morning, at noon, and at bedtime. In lidocaine ; from compounded pharmacy   nitroGLYCERIN  (NITROSTAT ) 0.4 MG SL tablet Place 1 tablet (0.4 mg total) under the tongue every 5 (five) minutes as needed for chest pain.   omeprazole  (PRILOSEC  OTC) 20 MG tablet Take 20 mg by  mouth daily as needed (reflux). (Takes approx. 3x per week)   rosuvastatin  (CRESTOR ) 20 MG tablet Take 1 tablet (20 mg total) by mouth daily.   sacubitril -valsartan  (ENTRESTO ) 49-51 MG Take 1 tablet by mouth 2 (two) times daily.   spironolactone  (ALDACTONE ) 25 MG tablet Take 0.5 tablets (12.5 mg total) by mouth daily.   [DISCONTINUED] allopurinol  (ZYLOPRIM ) 100 MG tablet Take 0.5 tablets (50 mg total) by mouth daily.   [DISCONTINUED] colchicine  0.6 MG tablet Take 0.6 mg by mouth daily.   [DISCONTINUED] pantoprazole  (PROTONIX ) 40 MG tablet Take 1 tablet (40 mg total) by mouth daily.   No facility-administered medications prior to visit.    Allergies  Allergen Reactions   Bextra [Valdecoxib] Swelling    Patient Care Team: Carlean Charter, DO as PCP - General (Family Medicine) Wenona Hamilton, MD as PCP - Cardiology (Cardiology)        Objective    Vitals: BP (!) 127/93 (BP Location: Left Arm, Patient Position: Sitting, Cuff Size: Normal)   Pulse 60   Ht 6\' 1"  (1.854 m)   Wt 244 lb 8 oz (110.9 kg)   SpO2 99%   BMI 32.26 kg/m     Physical Exam Vitals and nursing note reviewed.  Constitutional:      General: He is not in acute distress.    Appearance: Normal appearance.  HENT:     Head: Normocephalic and atraumatic.  Eyes:     General: No scleral icterus.    Conjunctiva/sclera: Conjunctivae normal.  Cardiovascular:     Rate and Rhythm: Normal rate.  Pulmonary:     Effort: Pulmonary effort is normal.  Neurological:     Mental Status: He is alert and oriented to person, place, and time. Mental status is at baseline.  Psychiatric:        Mood and Affect: Mood normal.        Behavior: Behavior normal.     Most recent functional status assessment:    10/06/2023   11:04 AM  In your present state of health, do you have any difficulty performing the following activities:  Hearing? 0  Vision? 1  Difficulty concentrating or making decisions? 1  Walking or climbing  stairs? 1  Dressing or bathing? 0  Doing errands, shopping? 0  Preparing Food and eating ? N  Using the Toilet? N  In the past six months, have you accidently leaked urine? Y  Do you have problems with loss of bowel control? N  Managing your Medications? N  Managing your Finances? N  Housekeeping or managing your Housekeeping? N   Most recent fall risk assessment:    10/06/2023   11:03 AM  Fall Risk   Falls in the past year? 0  Number falls in past yr: 0  Injury with Fall? 0  Risk for fall due to : No Fall Risks  Follow up Falls evaluation completed    Most recent depression screenings:    10/06/2023   10:59 AM 08/23/2023    2:03 PM  PHQ 2/9 Scores  PHQ - 2 Score 1 0  PHQ- 9 Score 7 2   Most recent cognitive screening:    10/06/2023   11:23 AM  6CIT Screen  What Year? 0 points  What month? 0 points  What time? 0 points  Count back from 20 0 points  Months in reverse 0 points  Repeat phrase 0 points  Total Score 0 points   Most recent Audit-C alcohol use screening    10/06/2023   11:02 AM  Alcohol Use Disorder Test (AUDIT)  1. How often do you have a drink containing alcohol? 0  2. How many drinks containing alcohol do you have on a typical day when you are drinking? 0  3. How often do you have six or more drinks on one occasion? 0  AUDIT-C Score 0   A score of 3 or more in women, and 4 or more in men indicates increased risk for alcohol abuse, EXCEPT if all of the points are from question 1   No results found for any visits on 10/06/23.  Assessment & Plan     Annual wellness visit done today including the all of the following: Reviewed patient's Family Medical History Reviewed and updated list of patient's medical providers Assessment of cognitive impairment was done Assessed patient's functional ability Established a written schedule for health screening services Health Risk Assessent Completed and Reviewed Patient is not currently on any opioid  medications.   Exercise Activities and Dietary recommendations  Goals   None     Immunization History  Administered Date(s) Administered   Fluad Trivalent(High Dose 65+) 05/27/2023   PNEUMOCOCCAL CONJUGATE-20 05/27/2023   Tdap 10/03/2007    Health Maintenance  Topic Date Due   DTaP/Tdap/Td (2 - Td or Tdap) 10/25/2023 (Originally 10/02/2017)   Zoster Vaccines- Shingrix (1 of 2) 11/23/2023 (Originally 08/20/1974)   COVID-19 Vaccine (1) 03/08/2024 (Originally 08/19/1960)   INFLUENZA VACCINE  01/07/2024   Medicare Annual Wellness (AWV)  10/05/2024   Colonoscopy  06/29/2026   Pneumonia Vaccine 53+ Years old  Completed   Hepatitis C Screening  Completed   HPV VACCINES  Aged Out   Meningococcal B Vaccine  Aged Out     Discussed health benefits of physical activity, and encouraged him to engage in regular exercise appropriate for his age and condition.    Medicare annual wellness visit, subsequent  Gout involving toe of right foot, unspecified cause, unspecified chronicity -     Allopurinol ; Take 0.5 tablets (50 mg total) by mouth daily.  Dispense: 45 tablet; Refill: 3 -     Colchicine ; Take 1.2 mg (two 0.6-mg tablets) orally at the first sign of a flare followed by 0.6 mg (1 tablet) one hour later; after twelve hours, may start 1 tab twice per day for the duration of the flare up to a max of 7 days (but discontinue for stomach pains or diarrhea)  Dispense: 30 tablet; Refill: 2 -     Uric acid  Essential hypertension     Medicare annual wellness visit, subsequent Due for tetanus vaccine. Previous provider records may indicate recent vaccination, but unavailable. - Request records from previous provider to verify tetanus vaccination. - Advise tetanus vaccine at local pharmacy if records do not confirm recent vaccination.  Gout Gout flares severe, affecting knees, ankles, and feet. Early colchicine  intervention effective. Advised to limit colchicine  use to prevent adverse  effects. - Prescribe colchicine : two tablets at flare onset, one tablet one hour later, then  twice daily dosing 12 hours after last dose until symptoms resolve. - Continue allopurinol  50 mg daily. - Order uric acid level, to be drawn at upcoming cardiology appointment. - Advise increased water  intake for gout and kidney function management.  Hypertension Chronic, overall stable. Hypertension managed with metoprolol , Entresto , and spironolactone . Blood pressure elevated during visit; morning dose of metoprolol  not taken. Cardiology to continue management. - Continue metoprolol , Entresto , and spironolactone . - Defer hypertension management to cardiology. - Monitor blood pressure at home regularly (2x per week) - No changes today    Follow-up Follow-up planned for gout management and kidney function. Upcoming cardiology appointment for additional labs. - Instruct to contact office if experiencing more frequent gout flares.  Return in about 6 months (around 04/06/2024).     I discussed the assessment and treatment plan with the patient  The patient was provided an opportunity to ask questions and all were answered. The patient agreed with the plan and demonstrated an understanding of the instructions.   The patient was advised to call back or seek an in-person evaluation if the symptoms worsen or if the condition fails to improve as anticipated.    Carlean Charter, DO  [facility] [phone] (phone) [phone] (fax)  [facility]

## 2023-10-06 NOTE — Patient Instructions (Addendum)
 Recommend Tdap vaccine at local pharmacy.   Colchicine  instructions Take 1.2 mg (two 0.6-mg tablets) orally at the first sign of a flare followed by 0.6 mg (1 tablet) one hour later; after twelve hours, may start 1 tab twice per day for the duration of the flare up to a max of 7 days (but discontinue for stomach pains or diarrhea)

## 2023-11-05 ENCOUNTER — Encounter: Payer: Self-pay | Admitting: Surgery

## 2023-11-05 ENCOUNTER — Ambulatory Visit: Admitting: Surgery

## 2023-11-05 ENCOUNTER — Telehealth: Payer: Self-pay

## 2023-11-05 VITALS — BP 123/85 | HR 80 | Temp 98.4°F | Ht 73.0 in | Wt 245.2 lb

## 2023-11-05 DIAGNOSIS — K6289 Other specified diseases of anus and rectum: Secondary | ICD-10-CM

## 2023-11-05 DIAGNOSIS — K648 Other hemorrhoids: Secondary | ICD-10-CM | POA: Diagnosis not present

## 2023-11-05 DIAGNOSIS — K602 Anal fissure, unspecified: Secondary | ICD-10-CM | POA: Diagnosis not present

## 2023-11-05 NOTE — Telephone Encounter (Signed)
 Will update surgeons office.

## 2023-11-05 NOTE — Progress Notes (Signed)
 11/05/2023  History of Present Illness: Jeff Warren is a 68 y.o. male presenting for follow up of a posterior anal fissure and hemorrhoids.  The patient was last seen on 10/04/23.  At the time his sphincter tone was improving with the use of Nifedipine and the pain/bleeding had also improved.  He reports today that there's a bit more improvement.  He notices only small amount of blood sometimes when he wipes.  The pain is better with bowel movements but has not resolved.  He still feels tissue prolapsing with bowel movements and he reports that there's currently tissue prolapsing which he left out to show me.  He takes Miralax twice daily and eats a lot of vegetables.  Sometimes he may have harder stool, and sometimes softer stool.  He feels he can improve his fluid  intake.  He also asked his Cardiology team about timing for Plavix  duration and was informed he would need a minimum of 6 months from his catheterization which was on 05/26/23.  Past Medical History: Past Medical History:  Diagnosis Date   Aortic root dilation (HCC)    a.) TTE 02/26/2021: mild; measured 40 mm.   BPH (benign prostatic hyperplasia)    CAD (coronary artery disease)    a. inferior ST elevation MI 12/05/16: cardiac cath LM nl, p-mLAD 80%, D1 CTO with right-to-left collats, LCx minor irregs,  m-dRCA 30%, dRCA 100% s/p PCI/DES, EF 45-50%, severely elevated LVEDP; b. staged PCI 12/23/16 - successful PCI/DES to p-mLAD with small diag being jailed by stent (vessel known to be subtotally occluded with R-L collats)   Dyspnea    Family history of premature CAD    a. sister passed from MI at age 64   GERD (gastroesophageal reflux disease)    Gout    HLD (hyperlipidemia)    Hypertension    LAFB (left anterior fascicular block)    Severe sepsis (HCC) 12/03/2020   ST elevation myocardial infarction (STEMI) of inferior wall (HCC) 12/05/2016   a.) LHC --> EF 45% with severely elevated LVEDP; 2v CAD; culprit was thrombotic occlusion  of dRCA; D1 occluded with R-L collaterals; 80% p-mLAD. PCI performed placing a 3.5 x 15 mm Resolute Onyx DES x 1 to dRCA. Plans for staged PCI of LAD.   Systolic dysfunction    a.) TTE 12/05/16: EF 50-55%, inf HK, GR1DD, mildly dilated LA. b.) TTE 02/26/21: EF 45-50%, inferolateral HK, mild MR, G2DD.   Urinary retention      Past Surgical History: Past Surgical History:  Procedure Laterality Date   ABDOMINOPLASTY     CATARACT EXTRACTION Bilateral    COLONOSCOPY WITH PROPOFOL  N/A 06/29/2016   Procedure: COLONOSCOPY WITH PROPOFOL ;  Surgeon: Garrett Kallman, MD;  Location: [facility];  Service: Endoscopy;  Laterality: N/A;   CORONARY BALLOON ANGIOPLASTY N/A 05/26/2023   Procedure: CORONARY BALLOON ANGIOPLASTY;  Surgeon: Wenona Hamilton, MD;  Location: [facility];  Service: Cardiovascular;  Laterality: N/A;   CORONARY PRESSURE/FFR STUDY N/A 05/26/2023   Procedure: CORONARY PRESSURE/FFR STUDY;  Surgeon: Wenona Hamilton, MD;  Location: [facility];  Service: Cardiovascular;  Laterality: N/A;   CORONARY STENT INTERVENTION N/A 12/23/2016   Procedure: Coronary Stent Intervention;  Surgeon: Wenona Hamilton, MD;  Location: [facility];  Service: Cardiovascular;  Laterality: N/A;   CORONARY STENT INTERVENTION N/A 05/26/2023   Procedure: CORONARY STENT INTERVENTION;  Surgeon: Wenona Hamilton, MD;  Location: [facility];  Service: Cardiovascular;  Laterality: N/A;  CORONARY/GRAFT ACUTE MI REVASCULARIZATION N/A 12/05/2016   Procedure: Coronary/Graft Acute MI Revascularization;  Surgeon: Wenona Hamilton, MD;  Location: [facility];  Service: Cardiovascular;  Laterality: N/A;   ganglion cyst removal Right    GAS INSERTION Left 11/21/2020   Procedure: INSERTION OF GAS LEFT EYE (C3F8);  Surgeon: Linard Reno, MD;  Location: [facility] OR;  Service: Ophthalmology;  Laterality: Left;   GAS/FLUID EXCHANGE Left 11/21/2020   Procedure: GAS/FLUID EXCHANGE;  Surgeon:  Linard Reno, MD;  Location: [facility] OR;  Service: Ophthalmology;  Laterality: Left;   HOLEP-LASER ENUCLEATION OF THE PROSTATE WITH MORCELLATION N/A 03/25/2021   Procedure: HOLEP-LASER ENUCLEATION OF THE PROSTATE WITH MORCELLATION;  Surgeon: Lawerence Pressman, MD;  Location: [facility] ORS;  Service: Urology;  Laterality: N/A;   LEFT HEART CATH AND CORONARY ANGIOGRAPHY N/A 12/05/2016   Procedure: Left Heart Cath and Coronary Angiography;  Surgeon: Wenona Hamilton, MD;  Location: [facility];  Service: Cardiovascular;  Laterality: N/A;   NO PAST SURGERIES     Has has some cosmetic surgeries   RETINAL DETACHMENT SURGERY     RIGHT/LEFT HEART CATH AND CORONARY ANGIOGRAPHY N/A 05/26/2023   Procedure: RIGHT/LEFT HEART CATH AND CORONARY ANGIOGRAPHY;  Surgeon: Wenona Hamilton, MD;  Location: [facility];  Service: Cardiovascular;  Laterality: N/A;   VITRECTOMY 25 GAUGE WITH SCLERAL BUCKLE Left 11/21/2020   Procedure: VITRECTOMY 25 GAUGE WITH SCLERAL BUCKLE;  Surgeon: Linard Reno, MD;  Location: [facility] OR;  Service: Ophthalmology;  Laterality: Left;    Home Medications: Prior to Admission medications   Medication Sig Start Date End Date Taking? Authorizing Provider  allopurinol  (ZYLOPRIM ) 100 MG tablet Take 0.5 tablets (50 mg total) by mouth daily. 10/06/23  Yes Pardue, Asencion Blacksmith, DO  aspirin  EC 81 MG tablet Take 81 mg by mouth daily. Swallow whole.   Yes [provider]  Carboxymethylcell-Hypromellose (GENTEAL OP) Place 1 drop into both eyes daily as needed (dry eyes).   Yes [provider]  Cholecalciferol  (DIALYVITE VITAMIN D  5000) 125 MCG (5000 UT) capsule Take 5,000 Units by mouth daily.   Yes [provider]  clopidogrel  (PLAVIX ) 75 MG tablet Take 1 tablet by mouth once daily with breakfast 09/20/23  Yes Wenona Hamilton, MD  colchicine  0.6 MG tablet Take 1.2 mg (two 0.6-mg tablets) orally at the first sign of a flare followed by 0.6 mg (1 tablet) one hour later;  after twelve hours, may start 1 tab twice per day for the duration of the flare up to a max of 7 days (but discontinue for stomach pains or diarrhea) 10/06/23  Yes Pardue, Asencion Blacksmith, DO  empagliflozin  (JARDIANCE ) 10 MG TABS tablet Take 1 tablet (10 mg total) by mouth daily. 06/04/23  Yes Wenona Hamilton, MD  ezetimibe  (ZETIA ) 10 MG tablet Take 1 tablet (10 mg total) by mouth daily. 08/30/23 11/28/23 Yes Wittenborn, Bernardo Bridgeman, NP  fluticasone  (FLONASE ) 50 MCG/ACT nasal spray Place 1 spray into both nostrils at bedtime.   Yes [provider]  Menaquinone-7 (VITAMIN K2 PO) Take 1 tablet by mouth daily.   Yes [provider]  metoprolol  succinate (TOPROL -XL) 50 MG 24 hr tablet Take 1 tablet (50 mg total) by mouth daily. Take with or immediately following a meal. 09/10/23 12/09/23 Yes Wittenborn, Bernardo Bridgeman, NP  NIFEdipine Micronized POWD by Does not apply route in the morning, at noon, and at bedtime. In lidocaine ; from compounded pharmacy   Yes [provider]  nitroGLYCERIN  (NITROSTAT ) 0.4  MG SL tablet Place 1 tablet (0.4 mg total) under the tongue every 5 (five) minutes as needed for chest pain. 05/27/23 05/26/24 Yes Parcells, Cyrilla Drivers, PA-C  omeprazole  (PRILOSEC  OTC) 20 MG tablet Take 20 mg by mouth daily as needed (reflux). (Takes approx. 3x per week)   Yes [provider]  rosuvastatin  (CRESTOR ) 20 MG tablet Take 1 tablet (20 mg total) by mouth daily. 08/09/23  Yes Wittenborn, Bernardo Bridgeman, NP  sacubitril -valsartan  (ENTRESTO ) 49-51 MG Take 1 tablet by mouth 2 (two) times daily. 06/04/23  Yes Wenona Hamilton, MD  spironolactone  (ALDACTONE ) 25 MG tablet Take 0.5 tablets (12.5 mg total) by mouth daily. 08/09/23 11/07/23 Yes Morey Ar, NP    Allergies: Allergies  Allergen Reactions   Bextra [Valdecoxib] Swelling    Review of Systems: Review of Systems  Constitutional:  Negative for chills and fever.  HENT:  Negative for hearing loss.   Respiratory:  Negative for  shortness of breath.   Cardiovascular:  Negative for chest pain.  Gastrointestinal:  Positive for blood in stool and constipation. Negative for abdominal pain, nausea and vomiting.  Genitourinary:  Negative for dysuria.  Musculoskeletal:  Negative for myalgias.  Skin:  Negative for rash.  Neurological:  Negative for dizziness.  Psychiatric/Behavioral:  Negative for depression.     Physical Exam BP 123/85   Pulse 80   Temp 98.4 F (36.9 C) (Oral)   Ht 6\' 1"  (1.854 m)   Wt 245 lb 3.2 oz (111.2 kg)   SpO2 97%   BMI 32.35 kg/m  CONSTITUTIONAL: No acute distress HEENT:  Normocephalic, atraumatic, extraocular motion intact. NECK:  Trachea is midline, no jugular venous distention. RESPIRATORY:  Lungs are clear, and breath sounds are equal bilaterally. Normal respiratory effort without pathologic use of accessory muscles. CARDIOVASCULAR: Heart is regular without murmurs, gallops, or rubs. RECTAL:  On external exam, there is a prolapsed mass beyond the anal verge.  This mass is firm, with finger-like projections like a condyloma.  No bleeding from the mass, but it is coming off the proximal aspect of the posterior anal fissure.  The mass was then pushed back in and digital rectal exam confirms this is located posteriorly. MUSCULOSKELETAL:  No peripheral edema, normal gait. NEUROLOGIC:  Motor and sensation is grossly normal.  Cranial nerves are grossly intact. PSYCH:  Alert and oriented to person, place and time. Affect is normal.   Assessment and Plan: This is a 68 y.o. male with anal fissure, internal hemorrhoids, and a posterior anal mass.  --After discussing with the patient, he had a colonoscopy in 2018.  Looking at the images, there is a small mass in the distal anal canal on retroflexion, although it was not mentioned in the report.  Now that his anal sphincter tone is better, I'm able to do a better exam and can palpate the mass proximally and clearly has grown over the years.  The  mass is pedunculated and does not appear fixed through the wall of the anal canal.   --Discussed with the patient that our next priority should be to excise the mass.  The patient has been recommended to have a minimum of 6 months of dual antiplatelet therapy with Aspirin  and Plavix .  His cardiac catheterization was on 05/26/23 and potentially we could do his surgery towards the end of June 2025.  We will send cardiology clearance to see if it's feasible to stop at least his Plavix  and if it is safe for him to undergo general anesthesia. --  Discussed with the patient the plan for exam under anesthesia, excision of anal mass / hemorrhoidectomy, and Botox chemical sphincterotomy to continue helping with the anal fissure.  Discussed with him the surgery at length with him including the risks of bleeding, infection, injury to surrounding structures, that this would be an outpatient surgery, holding at least his Plavix  per cardiology recs/clearance, pain control, and he's willing to proceed.  Will schedule him for surgery tentatively on 11/30/23 pending clearance. --In the meantime, continue working on constipation -- can continue Miralax, can add supplemental fiber, and continue drinking plenty of fluids.  I spent 40 minutes dedicated to the care of this patient on the date of this encounter to include pre-visit review of records, face-to-face time with the patient discussing diagnosis and management, and any post-visit coordination of care.   Marene Shape, MD [facility]

## 2023-11-05 NOTE — Patient Instructions (Addendum)
 You have requested to have Hemorrhoid surgery today. This will be scheduled at [facility] with Dr Mauri Sous.  If you are on any injectable weight loss medication, you will need to stop taking your GLP-1 injectable (weight loss) medications 8 days before your surgery to avoid any complications with anesthesia.   Please review the information below and your [facility] Information. Our surgery scheduler will call you to review surgery date and to go over information.  If you have FMLA or disability paperwork that needs filled out you may drop this off at our office or this can be faxed to [phone].  You will be required to do 2 enemas prior to your surgery. The first will be the night prior and the second will be done the morning of surgery.  Constipation is going to be your biggest obstacle following surgery. Use all stool softeners and laxatives as prescribed after your surgery and be sure to drink 72 ounces of water  or more every day. This will help to avoid constipation. If you do all of this and you are still having difficulty, please call our office for further instructions as soon as you begin to have difficulty with bowel movements.  You may want to buy a disposable Sitz bath prior to surgery to aid in pain and cleanliness after surgery. The information on how to do use a disposable sitz bath or using a bath tub are below.  You will be out of work 1-2 weeks depending on how your healing goes. If you have FMLA/Disability paperwork that needs filled out you may drop this off at the office or fax it to [phone].   Hemorrhoid Surgery After Care Refer to this sheet in the next few weeks. These instructions provide you with information about caring for yourself after your procedure. Your health care provider may also give you more specific instructions. Your treatment has been planned according to current medical practices, but problems sometimes occur. Call your health care provider if you  have any problems or questions after your procedure. What can I expect after the procedure? After the procedure, it is common to have: Rectal pain. Pain when you are having a bowel movement. Slight rectal bleeding.  Follow these instructions at home: Medicines Take over-the-counter and prescription medicines only as told by your health care provider. Do not drive or operate heavy machinery while taking prescription pain medicine. Use a stool softener or a bulk laxative as told by your health care provider. Activity Rest at home. Return to your normal activities as told by your health care provider. Do not lift anything that is heavier than 10 lb (4.5 kg). Do not sit for long periods of time. Take a walk every day or as told by your health care provider. Do not strain to have a bowel movement. Do not spend a long time sitting on the toilet. Eating and drinking Eat foods that contain fiber, such as whole grains, beans, nuts, fruits, and vegetables. Drink enough fluid to keep your urine clear or pale yellow. General instructions Sit in a warm bath 2-3 times per day to relieve soreness or itching. Keep all follow-up visits as told by your health care provider. This is important. Contact a health care provider if: Your pain medicine is not helping. You have a fever or chills. You become constipated. You have trouble passing urine. Get help right away if: You have very bad rectal pain. You have heavy bleeding from your rectum. This information is not intended to  replace advice given to you by your health care provider. Make sure you discuss any questions you have with your health care provider. Document Released: 08/15/2003 Document Revised: 10/31/2015 Document Reviewed: 08/20/2014 Elsevier Interactive Patient Education  2018 [employer].   Disposable Sitz Bath A disposable sitz bath is a plastic basin that fits over the toilet. A bag is hung above the toilet, and the bag is  connected to a tube that opens into the basin. The bag is filled with warm water  that flows into the basin through the tube. A sitz bath can be used to help relieve symptoms, clean, and promote healing in the genital and anal areas, as well as in the lower abdomen and buttocks. What are the risks? Sitz baths are generally very safe. It is possible for the skin between the genitals and the anus (perineum) to become infected, but this is rare. You can avoid this by cleaning your sitz bath supplies thoroughly. How to use a disposable sitz bath Close the clamp on the tube. Make sure the clamp is closed tightly to prevent leakage. Fill the sitz bath basin and the plastic bag with warm water . The water  should be warm enough to be comfortable, but not hot. Raise the toilet seat and place the filled basin on the toilet. Make sure the overflow opening is facing toward the back of the toilet. If you prefer, you may place the empty basin on the toilet first, and then use the plastic bag to fill the basin with warm water . Hang the filled plastic bag overhead on a hook or towel rack close to the toilet. The bag should be higher than the toilet so that the water  will flow down through the tube. Attach the tube to the opening on the basin. Make sure that the tube is attached to the basin tightly to prevent leakage. Sit on the basin and release the clamp. This will allow warm water  to flow into the basin and flush the area around your genitals and anus. Remain sitting on the basin for about 15-20 minutes, or as long as told by your health care provider. Stand up and gently pat your skin dry. If directed, apply clean bandages (dressings) to the affected area as told by your health care provider. Carefully remove the basin from the toilet seat and tip the basin into the toilet to empty any remaining water . Empty any remaining water  from the plastic bag into the toilet. Then, flush the toilet. Wash the basin with warm  water  and soap. Let the basin air dry in the sink. You should also let the plastic bag and the tubing air dry. Store the basin, tubing, and plastic bag in a clean, dry area. Wash your hands with soap and water . If soap and water  are not available, use hand sanitizer. Contact a health care provider if: You have symptoms that get worse instead of better. You develop new skin irritation, redness, or swelling around your genitals or anus. This information is not intended to replace advice given to you by your health care provider. Make sure you discuss any questions you have with your health care provider. Document Released: 11/24/2011 Document Revised: 10/31/2015 Document Reviewed: 04/14/2015 Elsevier Interactive Patient Education  2018 [employer].   How to Take a [employer] A sitz bath is a warm water  bath that is taken while you are sitting down. The water  should only come up to your hips and should cover your buttocks. Your health care provider may  recommend a sitz bath to help you: Clean the lower part of your body, including your genital area. With itching. With pain. With sore muscles or muscles that tighten or spasm.  How to take a sitz bath Take 3-4 sitz baths per day or as told by your health care provider. Partially fill a bathtub with warm water . You will only need the water  to be deep enough to cover your hips and buttocks when you are sitting in it. If your health care provider told you to put medicine in the water , follow the directions exactly. Sit in the water  and open the tub drain a little. Turn on the warm water  again to keep the tub at the correct level. Keep the water  running constantly. Soak in the water  for 15-20 minutes or as told by your health care provider. After the sitz bath, pat the affected area dry first. Do not rub it. Be careful when you stand up after the sitz bath because you may feel dizzy.  Contact a health care provider if: Your symptoms get worse.  Do not continue with sitz baths if your symptoms get worse. You have new symptoms. Do not continue with sitz baths until you talk with your health care provider. This information is not intended to replace advice given to you by your health care provider. Make sure you discuss any questions you have with your health care provider. Document Released: 02/15/2004 Document Revised: 10/23/2015 Document Reviewed: 05/23/2014 Elsevier Interactive Patient Education  [employer].

## 2023-11-05 NOTE — H&P (View-Only) (Signed)
 11/05/2023  History of Present Illness: Jeff Warren is a 68 y.o. male presenting for follow up of a posterior anal fissure and hemorrhoids.  The patient was last seen on 10/04/23.  At the time his sphincter tone was improving with the use of Nifedipine and the pain/bleeding had also improved.  He reports today that there's a bit more improvement.  He notices only small amount of blood sometimes when he wipes.  The pain is better with bowel movements but has not resolved.  He still feels tissue prolapsing with bowel movements and he reports that there's currently tissue prolapsing which he left out to show me.  He takes Miralax twice daily and eats a lot of vegetables.  Sometimes he may have harder stool, and sometimes softer stool.  He feels he can improve his fluid  intake.  He also asked his Cardiology team about timing for Plavix  duration and was informed he would need a minimum of 6 months from his catheterization which was on 05/26/23.  Past Medical History: Past Medical History:  Diagnosis Date   Aortic root dilation (HCC)    a.) TTE 02/26/2021: mild; measured 40 mm.   BPH (benign prostatic hyperplasia)    CAD (coronary artery disease)    a. inferior ST elevation MI 12/05/16: cardiac cath LM nl, p-mLAD 80%, D1 CTO with right-to-left collats, LCx minor irregs,  m-dRCA 30%, dRCA 100% s/p PCI/DES, EF 45-50%, severely elevated LVEDP; b. staged PCI 12/23/16 - successful PCI/DES to p-mLAD with small diag being jailed by stent (vessel known to be subtotally occluded with R-L collats)   Dyspnea    Family history of premature CAD    a. sister passed from MI at age 64   GERD (gastroesophageal reflux disease)    Gout    HLD (hyperlipidemia)    Hypertension    LAFB (left anterior fascicular block)    Severe sepsis (HCC) 12/03/2020   ST elevation myocardial infarction (STEMI) of inferior wall (HCC) 12/05/2016   a.) LHC --> EF 45% with severely elevated LVEDP; 2v CAD; culprit was thrombotic occlusion  of dRCA; D1 occluded with R-L collaterals; 80% p-mLAD. PCI performed placing a 3.5 x 15 mm Resolute Onyx DES x 1 to dRCA. Plans for staged PCI of LAD.   Systolic dysfunction    a.) TTE 12/05/16: EF 50-55%, inf HK, GR1DD, mildly dilated LA. b.) TTE 02/26/21: EF 45-50%, inferolateral HK, mild MR, G2DD.   Urinary retention      Past Surgical History: Past Surgical History:  Procedure Laterality Date   ABDOMINOPLASTY     CATARACT EXTRACTION Bilateral    COLONOSCOPY WITH PROPOFOL  N/A 06/29/2016   Procedure: COLONOSCOPY WITH PROPOFOL ;  Surgeon: Garrett Kallman, MD;  Location: [facility];  Service: Endoscopy;  Laterality: N/A;   CORONARY BALLOON ANGIOPLASTY N/A 05/26/2023   Procedure: CORONARY BALLOON ANGIOPLASTY;  Surgeon: Wenona Hamilton, MD;  Location: [facility];  Service: Cardiovascular;  Laterality: N/A;   CORONARY PRESSURE/FFR STUDY N/A 05/26/2023   Procedure: CORONARY PRESSURE/FFR STUDY;  Surgeon: Wenona Hamilton, MD;  Location: [facility];  Service: Cardiovascular;  Laterality: N/A;   CORONARY STENT INTERVENTION N/A 12/23/2016   Procedure: Coronary Stent Intervention;  Surgeon: Wenona Hamilton, MD;  Location: [facility];  Service: Cardiovascular;  Laterality: N/A;   CORONARY STENT INTERVENTION N/A 05/26/2023   Procedure: CORONARY STENT INTERVENTION;  Surgeon: Wenona Hamilton, MD;  Location: [facility];  Service: Cardiovascular;  Laterality: N/A;  CORONARY/GRAFT ACUTE MI REVASCULARIZATION N/A 12/05/2016   Procedure: Coronary/Graft Acute MI Revascularization;  Surgeon: Wenona Hamilton, MD;  Location: [facility];  Service: Cardiovascular;  Laterality: N/A;   ganglion cyst removal Right    GAS INSERTION Left 11/21/2020   Procedure: INSERTION OF GAS LEFT EYE (C3F8);  Surgeon: Linard Reno, MD;  Location: [facility] OR;  Service: Ophthalmology;  Laterality: Left;   GAS/FLUID EXCHANGE Left 11/21/2020   Procedure: GAS/FLUID EXCHANGE;  Surgeon:  Linard Reno, MD;  Location: [facility] OR;  Service: Ophthalmology;  Laterality: Left;   HOLEP-LASER ENUCLEATION OF THE PROSTATE WITH MORCELLATION N/A 03/25/2021   Procedure: HOLEP-LASER ENUCLEATION OF THE PROSTATE WITH MORCELLATION;  Surgeon: Lawerence Pressman, MD;  Location: [facility] ORS;  Service: Urology;  Laterality: N/A;   LEFT HEART CATH AND CORONARY ANGIOGRAPHY N/A 12/05/2016   Procedure: Left Heart Cath and Coronary Angiography;  Surgeon: Wenona Hamilton, MD;  Location: [facility];  Service: Cardiovascular;  Laterality: N/A;   NO PAST SURGERIES     Has has some cosmetic surgeries   RETINAL DETACHMENT SURGERY     RIGHT/LEFT HEART CATH AND CORONARY ANGIOGRAPHY N/A 05/26/2023   Procedure: RIGHT/LEFT HEART CATH AND CORONARY ANGIOGRAPHY;  Surgeon: Wenona Hamilton, MD;  Location: [facility];  Service: Cardiovascular;  Laterality: N/A;   VITRECTOMY 25 GAUGE WITH SCLERAL BUCKLE Left 11/21/2020   Procedure: VITRECTOMY 25 GAUGE WITH SCLERAL BUCKLE;  Surgeon: Linard Reno, MD;  Location: [facility] OR;  Service: Ophthalmology;  Laterality: Left;    Home Medications: Prior to Admission medications   Medication Sig Start Date End Date Taking? Authorizing Provider  allopurinol  (ZYLOPRIM ) 100 MG tablet Take 0.5 tablets (50 mg total) by mouth daily. 10/06/23  Yes Pardue, Asencion Blacksmith, DO  aspirin  EC 81 MG tablet Take 81 mg by mouth daily. Swallow whole.   Yes [provider]  Carboxymethylcell-Hypromellose (GENTEAL OP) Place 1 drop into both eyes daily as needed (dry eyes).   Yes [provider]  Cholecalciferol  (DIALYVITE VITAMIN D  5000) 125 MCG (5000 UT) capsule Take 5,000 Units by mouth daily.   Yes [provider]  clopidogrel  (PLAVIX ) 75 MG tablet Take 1 tablet by mouth once daily with breakfast 09/20/23  Yes Wenona Hamilton, MD  colchicine  0.6 MG tablet Take 1.2 mg (two 0.6-mg tablets) orally at the first sign of a flare followed by 0.6 mg (1 tablet) one hour later;  after twelve hours, may start 1 tab twice per day for the duration of the flare up to a max of 7 days (but discontinue for stomach pains or diarrhea) 10/06/23  Yes Pardue, Asencion Blacksmith, DO  empagliflozin  (JARDIANCE ) 10 MG TABS tablet Take 1 tablet (10 mg total) by mouth daily. 06/04/23  Yes Wenona Hamilton, MD  ezetimibe  (ZETIA ) 10 MG tablet Take 1 tablet (10 mg total) by mouth daily. 08/30/23 11/28/23 Yes Wittenborn, Bernardo Bridgeman, NP  fluticasone  (FLONASE ) 50 MCG/ACT nasal spray Place 1 spray into both nostrils at bedtime.   Yes [provider]  Menaquinone-7 (VITAMIN K2 PO) Take 1 tablet by mouth daily.   Yes [provider]  metoprolol  succinate (TOPROL -XL) 50 MG 24 hr tablet Take 1 tablet (50 mg total) by mouth daily. Take with or immediately following a meal. 09/10/23 12/09/23 Yes Wittenborn, Bernardo Bridgeman, NP  NIFEdipine Micronized POWD by Does not apply route in the morning, at noon, and at bedtime. In lidocaine ; from compounded pharmacy   Yes [provider]  nitroGLYCERIN  (NITROSTAT ) 0.4  MG SL tablet Place 1 tablet (0.4 mg total) under the tongue every 5 (five) minutes as needed for chest pain. 05/27/23 05/26/24 Yes Parcells, Cyrilla Drivers, PA-C  omeprazole  (PRILOSEC  OTC) 20 MG tablet Take 20 mg by mouth daily as needed (reflux). (Takes approx. 3x per week)   Yes [provider]  rosuvastatin  (CRESTOR ) 20 MG tablet Take 1 tablet (20 mg total) by mouth daily. 08/09/23  Yes Wittenborn, Bernardo Bridgeman, NP  sacubitril -valsartan  (ENTRESTO ) 49-51 MG Take 1 tablet by mouth 2 (two) times daily. 06/04/23  Yes Wenona Hamilton, MD  spironolactone  (ALDACTONE ) 25 MG tablet Take 0.5 tablets (12.5 mg total) by mouth daily. 08/09/23 11/07/23 Yes Morey Ar, NP    Allergies: Allergies  Allergen Reactions   Bextra [Valdecoxib] Swelling    Review of Systems: Review of Systems  Constitutional:  Negative for chills and fever.  HENT:  Negative for hearing loss.   Respiratory:  Negative for  shortness of breath.   Cardiovascular:  Negative for chest pain.  Gastrointestinal:  Positive for blood in stool and constipation. Negative for abdominal pain, nausea and vomiting.  Genitourinary:  Negative for dysuria.  Musculoskeletal:  Negative for myalgias.  Skin:  Negative for rash.  Neurological:  Negative for dizziness.  Psychiatric/Behavioral:  Negative for depression.     Physical Exam BP 123/85   Pulse 80   Temp 98.4 F (36.9 C) (Oral)   Ht 6\' 1"  (1.854 m)   Wt 245 lb 3.2 oz (111.2 kg)   SpO2 97%   BMI 32.35 kg/m  CONSTITUTIONAL: No acute distress HEENT:  Normocephalic, atraumatic, extraocular motion intact. NECK:  Trachea is midline, no jugular venous distention. RESPIRATORY:  Lungs are clear, and breath sounds are equal bilaterally. Normal respiratory effort without pathologic use of accessory muscles. CARDIOVASCULAR: Heart is regular without murmurs, gallops, or rubs. RECTAL:  On external exam, there is a prolapsed mass beyond the anal verge.  This mass is firm, with finger-like projections like a condyloma.  No bleeding from the mass, but it is coming off the proximal aspect of the posterior anal fissure.  The mass was then pushed back in and digital rectal exam confirms this is located posteriorly. MUSCULOSKELETAL:  No peripheral edema, normal gait. NEUROLOGIC:  Motor and sensation is grossly normal.  Cranial nerves are grossly intact. PSYCH:  Alert and oriented to person, place and time. Affect is normal.   Assessment and Plan: This is a 68 y.o. male with anal fissure, internal hemorrhoids, and a posterior anal mass.  --After discussing with the patient, he had a colonoscopy in 2018.  Looking at the images, there is a small mass in the distal anal canal on retroflexion, although it was not mentioned in the report.  Now that his anal sphincter tone is better, I'm able to do a better exam and can palpate the mass proximally and clearly has grown over the years.  The  mass is pedunculated and does not appear fixed through the wall of the anal canal.   --Discussed with the patient that our next priority should be to excise the mass.  The patient has been recommended to have a minimum of 6 months of dual antiplatelet therapy with Aspirin  and Plavix .  His cardiac catheterization was on 05/26/23 and potentially we could do his surgery towards the end of June 2025.  We will send cardiology clearance to see if it's feasible to stop at least his Plavix  and if it is safe for him to undergo general anesthesia. --  Discussed with the patient the plan for exam under anesthesia, excision of anal mass / hemorrhoidectomy, and Botox chemical sphincterotomy to continue helping with the anal fissure.  Discussed with him the surgery at length with him including the risks of bleeding, infection, injury to surrounding structures, that this would be an outpatient surgery, holding at least his Plavix  per cardiology recs/clearance, pain control, and he's willing to proceed.  Will schedule him for surgery tentatively on 11/30/23 pending clearance. --In the meantime, continue working on constipation -- can continue Miralax, can add supplemental fiber, and continue drinking plenty of fluids.  I spent 40 minutes dedicated to the care of this patient on the date of this encounter to include pre-visit review of records, face-to-face time with the patient discussing diagnosis and management, and any post-visit coordination of care.   Marene Shape, MD [facility]

## 2023-11-05 NOTE — Telephone Encounter (Signed)
 Faxed cardiac clearance to Dr. Antionette Kirks at [phone].

## 2023-11-05 NOTE — Telephone Encounter (Signed)
   Pre-operative Risk Assessment    Patient Name: Jeff Warren  DOB: [birth year] MRN: [id number]   Date of last office visit: 08/09/23 Morey Ar, NP Date of next office visit: 11/09/23 Morey Ar, NP   Request for Surgical Clearance    Procedure:  HEMORRHOIDECTOMY  Date of Surgery:  Clearance TBD (END OF JUNE?)                                Surgeon:  DR Emmalene Hare Surgeon's Group or Practice Name:  [facility] Phone number:  [phone] Fax number:  [phone]   Type of Clearance Requested:   - Medical  - Pharmacy:  Hold Aspirin  and Clopidogrel  (Plavix )     Type of Anesthesia:  General    Additional requests/questions:    Signed, Collin Deal   11/05/2023, 1:46 PM

## 2023-11-05 NOTE — Telephone Encounter (Signed)
   Name: Jeff Warren  DOB: December 07, 1955  MRN: [id number]  Primary Cardiologist: Antionette Kirks, MD  Chart reviewed as part of pre-operative protocol coverage. The patient has an upcoming visit scheduled with Morey Ar, NP on 11/09/2023 at which time clearance can be addressed in case there are any issues that would impact surgical recommendations.  Hemmorrhoidectomy Is not scheduled until TBD as below. I added preop FYI to appointment note so that provider is aware to address at time of outpatient visit.  Per office protocol the cardiology provider should forward their finalized clearance decision and recommendations regarding antiplatelet therapy to the requesting party below.      I will route this message as FYI to requesting party and remove this message from the preop box as separate preop APP input not needed at this time.   Please call with any questions.  Friddie Jetty, NP  11/05/2023, 2:25 PM

## 2023-11-06 NOTE — Progress Notes (Unsigned)
 Cardiology Clinic Note   Date: 11/09/2023 ID: Trueman, Worlds 05-09-1956, MRN [id number]  Primary Cardiologist:  Antionette Kirks, MD  Chief Complaint   Jeff Warren is a 68 y.o. male who presents to the clinic today for routine follow up and preoperative evaluation.   Patient Profile   Jeff Warren is followed by Dr. Alvenia Aus for the history outlined below.       Past medical history significant for: CAD. LHC 12/05/2016 (STEMI): Distal RCA 100%.  Mid to distal RCA 30%.  Proximal to mid LAD 80%.  D1 100%.  Mild LVH systolic dysfunction.  EF 45 to 50%.  Severely elevated LVEDP.  PCI with DES 3.5 x 15 mm to distal RCA. Staged PCI 12/23/2016: Mid to distal RCA 30%.  D1 100% with right to left collaterals.  Proximal to mid LAD 80%.  PCI with DES 3 x 23 mm to proximal to mid LAD. R/LHC 05/26/2023 (acute on chronic systolic heart failure): D1 100%.  Mid LAD 70%.  Ostial LM 20%.  Mid to distal RCA 40%.  Distal RCA 40%.  Ramus 95%.  PCI with DES 3.0 x 24 mm to proximal ramus.  Successful balloon angioplasty to mid LAD in-stent restenosis. Chronic systolic heart failure. R/LHC 05/26/2023: Severely elevated RA pressure, severe pulmonary hypertension, moderate to severely elevated wedge pressure, severely reduced cardiac output.  LVEDP severely elevated in the setting of frequent PVCs. Echo 09/03/2023: EF 40 to 45%.  Moderate asymmetric LVH of the septal segment.  Grade I DD.  Normal RV function.  Mild MR/AI.  Aortic root normal in size and structure. Hypertension. Hyperlipidemia. LPa 05/27/2023: 171.8. Lipid panel 08/23/2023: LDL 114, HDL 36, TG 183, total 182. OSA. Family history of premature CAD.  In summary, patient has a history of STEMI in June 2018.  He underwent PCI with DES to distal RCA followed by staged PCI in July 2018 with DES to proximal to mid LAD as detailed above.  Echo showed EF 50 to 55%, moderate concentric LVH, mild hypokinesis of inferior myocardium, Grade I DD, mild LAE.  Repeat echo  August 2018 showed EF 55 to 60%, no RWMA, Grade I DD, trivial AI, mild MR, mild LAE.  Patient was seen for overdue follow-up in September 2022 and complained of worsening dyspnea and fatigue with intermittent lower extremity edema.  Echo was updated at that time and demonstrated EF 45 to 50%, mild hypokinesis of left ventricular entire inferolateral wall, Grade II DD, normal RV size/function, mild MR, mild dilatation of aortic root 40 mm.  In November 2024 patient was seen for overdue follow-up and complained of exertional dyspnea with intermittent chest tightness similar to previous angina.  He reported stopping most medications and taking some intermittently.  He was scheduled for a cardiac PET/CT and repeat echo.  Echo demonstrated EF 30 to 35% as detailed above.  Cardiac PET/CT was canceled and he underwent R/LHC.  He underwent PCI with stent placement and balloon angioplasty as detailed above.  Right heart cath demonstrated elevated heart pressures as detailed above.  Patient was last seen in the office by Dr. Alvenia Aus on 06/04/2023 for follow-up after heart catheterization.  He reported significant improvement in symptoms with resolution of chest pain and orthopnea.  He was adherent to medications.   Patient was last seen in the office by me on 08/09/2023 for routine follow-up.  He reported stopping Lasix  secondary to a bad gout flare with no increase in edema or weight.  Spironolactone  was  added.  Discussed stopping carvedilol  and starting Toprol  so patient could take colchicine .  He wanted to hold off at the time of his visit.  Patient contacted the office in early April requesting to change carvedilol  to Toprol  secondary to gout flare.  Patient contacted the office on 10/18/2023 with inquiry regarding stopping DAPT for surgery. He was informed DAPT would need to be continued for at least 6 months from time PCI.      History of Present Illness    Today, patient is doing well. He has occasional "twinge" of  chest discomfort not related to exertion that lasts just a second and resolves on its own. He has no chest pain when walking or doing other household activities. Patient denies shortness of breath, dyspnea on exertion, lower extremity edema, orthopnea or PND. No palpitations. Gout flares have been better since transitioning to Toprol  to start on colchicine . Pain in feet limits his activity. He does walk 2 time a week for exercise and performs light household activities.        ROS: All other systems reviewed and are otherwise negative except as noted in History of Present Illness.  EKGs/Labs Reviewed    EKG Interpretation Date/Time:  Tuesday November 09 2023 10:27:14 EDT Ventricular Rate:  72 PR Interval:  188 QRS Duration:  116 QT Interval:  412 QTC Calculation: 451 R Axis:   -56  Text Interpretation: Normal sinus rhythm Possible Left atrial enlargement Left axis deviation Left anterior fascicular block When compared with ECG of 09-Aug-2023 09:17, No significant change Confirmed by Morey Ar [phone]) on 11/09/2023 10:36:45 AM   08/09/2023: ALT 14; AST 18 08/23/2023: BUN 24; Creatinine, Ser 1.22; Potassium 5.0; Sodium 140   08/23/2023: Hemoglobin 16.9; WBC 5.5    Physical Exam    VS:  BP 122/84   Pulse 72   Ht 6\' 1"  (1.854 m)   Wt 246 lb 9.6 oz (111.9 kg)   SpO2 96%   BMI 32.53 kg/m  , BMI Body mass index is 32.53 kg/m.  GEN: Well nourished, well developed, in no acute distress. Neck: No JVD or carotid bruits. Cardiac:  RRR. No murmurs. No rubs or gallops.   Respiratory:  Respirations regular and unlabored. Clear to auscultation without rales, wheezing or rhonchi. GI: Soft, nontender, nondistended. Extremities: Radials/DP/PT 2+ and equal bilaterally. No clubbing or cyanosis. No edema.  Skin: Warm and dry, no rash. Neuro: Strength intact.  Assessment & Plan   CAD S/p PCI with DES to distal RCA June 2018 in the setting of STEMI.  He underwent staged PCI with DES to mid  LAD in July 2018.  He developed acute on chronic systolic heart failure and underwent PCI with DES to proximal ramus and balloon angioplasty to mid LAD in-stent restenosis in December 2024.  Patient  reports occasional "twinge" of chest discomfort not related to exertion that lasts a second and resolves on its own. "It is not long enough for me to even think about taking nitroglycerin ." EKG today without acute changes.  -Continue aspirin , Plavix , Toprol , rosuvastatin , as needed SL NTG.   Chronic systolic heart failure Echo December 2024 showed EF 30 to 35%, global hypokinesis, moderate LVH, moderately reduced RV function, moderately elevated PA pressure, mild to moderate MR, mild AI.  Right heart catheterization December 2024 demonstrated severely elevated RA pressure, severe pulmonary hypertension, moderate to severely elevated wedge pressure, severely reduced cardiac output. Repeat limited echo showed EF 40-45%, grade I DD.  Patient denies shortness of breath, DOE,  lower extremity edema, orthopnea or PND.  Daily weight has been stable.  Euvolemic and well compensated on exam. -Continue Toprol , Entresto , Jardiance , spironolactone , Lasix .    Hypertension BP today 122/84. No headaches or dizziness reported.  -Continue Toprol , Entresto , spironolactone .   Hyperlipidemia LDL 114 March 2025, not at goal.  Zetia  added at that time. -Continue rosuvastatin  and Zetia . - Lipid panel and LFTs today.    Gout Patient with flare of gout after visit in March.  He contacted the office and carvedilol  was changed to Toprol  so he could start colchicine .  He reports gout flares have improved since being able to start on colchicine .  - Continue to follow with PCP.   Preoperative cardiovascular risk assessment Hemorrhoidectomy by Dr. Mauri Sous.  According to the RCRI, patient has a 6.6-10.1% risk of MACE. Patient reports activity equivalent to 4.0 METS (walking for exercise twice a week and light household activities).   -Based on ACC/AHA guidelines, ELMOR KOST would be at acceptable risk for the planned procedure without further cardiovascular testing.  -Per Dr. Alvenia Aus, hold Plavix  5 days prior to procedure. Patient will continue aspirin  81 mg daily throughout perioperative period.   Disposition: Lipid panel and CMP today. Return in 6 months or sooner as needed.    Signed, Lonell Rives. Tallin Hart, DNP, NP-C

## 2023-11-08 ENCOUNTER — Telehealth: Payer: Self-pay | Admitting: Surgery

## 2023-11-08 NOTE — Telephone Encounter (Signed)
 Patient has been advised of Pre-Admission date/time, and Surgery date at [facility].  Surgery Date: 11/30/23 Preadmission Testing Date: 11/22/23 (phone 8a-1p)  Patient informed of the scheduling process and surgery information given at time of office visit.  Patient has been made aware to call [phone], between 1-3:00pm the day before surgery, to find out what time to arrive for surgery.

## 2023-11-09 ENCOUNTER — Encounter: Payer: Self-pay | Admitting: Student

## 2023-11-09 ENCOUNTER — Ambulatory Visit: Attending: Student | Admitting: Student

## 2023-11-09 VITALS — BP 122/84 | HR 72 | Ht 73.0 in | Wt 246.6 lb

## 2023-11-09 DIAGNOSIS — Z79899 Other long term (current) drug therapy: Secondary | ICD-10-CM | POA: Diagnosis present

## 2023-11-09 DIAGNOSIS — Z01810 Encounter for preprocedural cardiovascular examination: Secondary | ICD-10-CM | POA: Insufficient documentation

## 2023-11-09 DIAGNOSIS — E785 Hyperlipidemia, unspecified: Secondary | ICD-10-CM | POA: Diagnosis present

## 2023-11-09 DIAGNOSIS — I10 Essential (primary) hypertension: Secondary | ICD-10-CM | POA: Insufficient documentation

## 2023-11-09 DIAGNOSIS — M109 Gout, unspecified: Secondary | ICD-10-CM | POA: Diagnosis present

## 2023-11-09 DIAGNOSIS — I5022 Chronic systolic (congestive) heart failure: Secondary | ICD-10-CM | POA: Diagnosis not present

## 2023-11-09 DIAGNOSIS — I2510 Atherosclerotic heart disease of native coronary artery without angina pectoris: Secondary | ICD-10-CM | POA: Insufficient documentation

## 2023-11-09 NOTE — Patient Instructions (Signed)
 Medication Instructions:  Your Physician recommend you continue on your current medication as directed.    *If you need a refill on your cardiac medications before your next appointment, please call your pharmacy*  Lab Work: Your provider would like for you to have following labs drawn today CMet, Lipid Panel.   If you have labs (blood work) drawn today and your tests are completely normal, you will receive your results only by: MyChart Message (if you have MyChart) OR A paper copy in the mail If you have any lab test that is abnormal or we need to change your treatment, we will call you to review the results.  Testing/Procedures: None ordered at this time   Follow-Up: At [facility], you and your health needs are our priority.  As part of our continuing mission to provide you with exceptional heart care, our providers are all part of one team.  This team includes your primary Cardiologist (physician) and Advanced Practice Providers or APPs (Physician Assistants and Nurse Practitioners) who all work together to provide you with the care you need, when you need it.  Your next appointment:   6 month(s)  Provider:   You may see Antionette Kirks, MD or one of the following Advanced Practice Providers on your designated Care Team:   Laneta Pintos, NP Gildardo Labrador, PA-C Varney Gentleman, PA-C Cadence De Soto, PA-C Ronald Cockayne, NP Morey Ar, NP    We recommend signing up for the patient portal called "MyChart".  Sign up information is provided on this After Visit Summary.  MyChart is used to connect with patients for Virtual Visits (Telemedicine).  Patients are able to view lab/test results, encounter notes, upcoming appointments, etc.  Non-urgent messages can be sent to your provider as well.   To learn more about what you can do with MyChart, go to [email or web address].

## 2023-11-10 ENCOUNTER — Ambulatory Visit: Payer: Self-pay | Admitting: Student

## 2023-11-10 LAB — COMPREHENSIVE METABOLIC PANEL WITH GFR
ALT: 17 IU/L (ref 0–44)
AST: 21 IU/L (ref 0–40)
Albumin: 4.5 g/dL (ref 3.9–4.9)
Alkaline Phosphatase: 76 IU/L (ref 44–121)
BUN/Creatinine Ratio: 14 (ref 10–24)
BUN: 20 mg/dL (ref 8–27)
Bilirubin Total: 0.5 mg/dL (ref 0.0–1.2)
CO2: 20 mmol/L (ref 20–29)
Calcium: 8.7 mg/dL (ref 8.6–10.2)
Chloride: 105 mmol/L (ref 96–106)
Creatinine, Ser: 1.4 mg/dL — ABNORMAL HIGH (ref 0.76–1.27)
Globulin, Total: 2.5 g/dL (ref 1.5–4.5)
Glucose: 99 mg/dL (ref 70–99)
Potassium: 4.3 mmol/L (ref 3.5–5.2)
Sodium: 142 mmol/L (ref 134–144)
Total Protein: 7 g/dL (ref 6.0–8.5)
eGFR: 55 mL/min/{1.73_m2} — ABNORMAL LOW (ref >59)

## 2023-11-10 LAB — URIC ACID: Uric Acid: 5.5 mg/dL (ref 3.8–8.4)

## 2023-11-10 LAB — LIPID PANEL
Chol/HDL Ratio: 3.8 ratio (ref 0.0–5.0)
Cholesterol, Total: 135 mg/dL (ref 100–199)
HDL: 36 mg/dL — ABNORMAL LOW (ref >39)
LDL Chol Calc (NIH): 73 mg/dL (ref 0–99)
Triglycerides: 152 mg/dL — ABNORMAL HIGH (ref 0–149)
VLDL Cholesterol Cal: 26 mg/dL (ref 5–40)

## 2023-11-11 ENCOUNTER — Telehealth: Payer: Self-pay

## 2023-11-11 NOTE — Telephone Encounter (Signed)
 Received cardiac clearance from Dr. Alvenia Aus. Based on ACC/AHA guidelines, pt would be acceptable risk for the planned procedure without further cardiovascular testing.  Per Dr. Alvenia Aus, hold Plavix  for 5 days prior to procedure. Patient will continue aspirin  81 mg daily throughout perioperative period.   Notes are in EPIC.

## 2023-11-11 NOTE — Progress Notes (Signed)
 Last read by Kathreen Pare at 4:40PM on 11/10/2023.

## 2023-11-22 ENCOUNTER — Encounter
Admission: RE | Admit: 2023-11-22 | Discharge: 2023-11-22 | Disposition: A | Discharge status: Home / Self Care | Source: Ambulatory Visit | Attending: Surgery | Admitting: Surgery

## 2023-11-22 ENCOUNTER — Other Ambulatory Visit: Payer: Self-pay

## 2023-11-22 NOTE — Patient Instructions (Addendum)
 Your procedure is scheduled on: Tuesday 11/30/23 To find out your arrival time, please call [phone] between 1PM - 3PM on:  Monday 11/29/23  Report to the Registration Desk on the 1st floor of the [facility]. Free Valet parking is available.  If your arrival time is 6:00 am, do not arrive before that time as the [facility] entrance doors do not open until 6:00 am.  REMEMBER: Instructions that are not followed completely may result in serious medical risk, up to and including death; or upon the discretion of your surgeon and anesthesiologist your surgery may need to be rescheduled.  Do not eat food or drink any liquids after midnight the night before surgery.  No gum chewing or hard candies.  One week prior to surgery: Stop Anti-inflammatories (NSAIDS) such as Advil, Aleve, Ibuprofen, Motrin, Naproxen, Naprosyn and Aspirin  based products such as Excedrin, Goody's Powder, BC Powder. You may however, continue to take Tylenol  if needed for pain up until the day of surgery.  Stop ANY OVER THE COUNTER supplements and vitamins for 7 days until after surgery.  Continue taking all prescribed medications with the exception of the following: Jardiance  last dose Friday 11/26/23. Plavix  last dose Wednesday 10/24/23. Continue Aspirin  except the day of surgery (11/30/23)  TAKE ONLY THESE MEDICATIONS THE MORNING OF SURGERY WITH A SIP OF WATER :  metoprolol  succinate (TOPROL -XL) 50 MG 24 hr tablet  omeprazole  (PRILOSEC  OTC) 20 MG tablet Antacid (take one the night before and one on the morning of surgery - helps to prevent nausea after surgery.) allopurinol  (ZYLOPRIM ) 50 MG (1/2) tablet  colchicine  0.6 MG table if needed  Use inhalers on the day of surgery and bring to the hospital.  Fleets (saline) enema or bowel prep as directed. Use once at bedtime and again prior to arrival for your procedure. You can pick these up at our office at [address] [facility] [address].  No  Alcohol for 24 hours before or after surgery.  No Smoking including e-cigarettes for 24 hours before surgery.  No chewable tobacco products for at least 6 hours before surgery.  No nicotine patches on the day of surgery.  Do not use any recreational drugs for at least a week (preferably 2 weeks) before your surgery.  Please be advised that the combination of cocaine and anesthesia may have negative outcomes, up to and including death. If you test positive for cocaine, your surgery will be cancelled.  On the morning of surgery brush your teeth with toothpaste and water , you may rinse your mouth with mouthwash if you wish. Do not swallow any toothpaste or mouthwash.  Shower as usual the day of your procedure  Do not wear lotions, powders, or perfumes/cologne.   Do not shave body hair from the neck down 48 hours before surgery.  Wear comfortable clothing (specific to your surgery type) to the hospital.  Do not wear jewelry, make-up, hairpins, clips or nail polish.  For welded (permanent) jewelry: bracelets, anklets, waist bands, etc.  Please have this removed prior to surgery.  If it is not removed, there is a chance that hospital personnel will need to cut it off on the day of surgery. Contact lenses, hearing aids and dentures may not be worn into surgery.  Do not bring valuables to the hospital. [facility] is not responsible for any missing/lost belongings or valuables.   Notify your doctor if there is any change in your medical condition (cold, fever, infection).  If you are being discharged the day of surgery, you will not be allowed to drive home. You will need a responsible individual to drive you home and stay with you for 24 hours after surgery.   After surgery, you can help prevent lung complications by doing breathing exercises.  Take deep breaths and cough every 1-2 hours. Your doctor may order a device called an Incentive Spirometer to help you take deep  breaths.  Surgery Visitation Policy:  Patients undergoing a surgery or procedure may have two family members or support persons with them as long as the person is not COVID-19 positive or experiencing its symptoms.   Please call the Pre-admissions Testing Dept. at [phone] if you have any questions about these instructions.

## 2023-11-22 NOTE — Pre-Procedure Instructions (Signed)
  Jeff Warren 11/11/2023   Telephone MRN: [id number] Description: 68 year old male Provider: Langston Pippins, CMA Department: [facility] SURGICAL   Reason for [facility]  Cardiac clearance    Call Documentation  Langston Pippins, CMA at 11/11/2023  8:45 AM  Status: Signed  Received cardiac clearance from Dr. Alvenia Aus. Based on ACC/AHA guidelines, pt would be acceptable risk for the planned procedure without further cardiovascular testing.   Per Dr. Alvenia Aus, hold Plavix  for 5 days prior to procedure. Patient will continue aspirin  81 mg daily throughout perioperative period.     Notes are in EPIC.     Questionnaires  No completed forms available for this encounter.   Visit Pharmacy  [id number] - [place] ([place]), [place] - [address]   Created by  Langston Pippins, CMA on 11/11/2023 08:45 AM

## 2023-11-25 ENCOUNTER — Encounter: Payer: Self-pay | Admitting: Surgery

## 2023-11-29 ENCOUNTER — Encounter: Payer: Self-pay | Admitting: Surgery

## 2023-11-29 NOTE — Progress Notes (Signed)
 Perioperative / Anesthesia Services  Pre-Admission Testing Clinical Review / Pre-Operative Anesthesia Consult  Date: 11/29/23  PATIENT DEMOGRAPHICS: Name: Jeff Warren DOB: [birth year] MRN:   [id number]  Note: Available PAT nursing documentation and vital signs have been reviewed. Clinical nursing staff has updated patient's PMH/PSHx, current medication list, and drug allergies/intolerances to ensure complete and comprehensive history available to assist care teams in MDM as it pertains to the aforementioned surgical procedure and anticipated anesthetic course. Extensive review of available clinical information personally performed. [name] [facility] and PSHx updated with any diagnoses/procedures that  may have been inadvertently omitted during his intake with the pre-admission testing department's nursing staff.  PLANNED SURGICAL PROCEDURE(S):   Case: [id number] Date/Time: 11/30/23 0715   Procedures:      EXAM UNDER ANESTHESIA, RECTUM     HEMORRHOIDECTOMY     BOTOX INJECTION   Anesthesia type: General   Diagnosis:      Anal fissure [K60.2]     Internal hemorrhoids [K64.8]   Pre-op diagnosis:      anal fissure     internal hemorrhoids     anal polyp   Location: [facility] OR ROOM 04 / [facility]   Surgeons: Desiderio Schanz, MD        CLINICAL DISCUSSION: Jeff Warren is a 68 y.o. male who is submitted for pre-surgical anesthesia review and clearance prior to him undergoing the above procedure. Patient has never been a smoker. Pertinent PMH includes: CAD, inferior STEMI, HFrEF, LAFB, mild aortic root dilation, HTN, HLD, dyspnea, GERD (on daily PPI), internal hemorrhoids, BPH.   Patient is followed by cardiology Marsa, MD). He was last seen in the cardiology clinic on 11/09/2023; notes reviewed. At the time of his clinic visit, patient doing well overall from a cardiovascular perspective.  Patient complaining of occasional episodes of chest discomfort that he describes as a  twinge.  Symptom is self-limiting at rest with no associated shortness of breath.  Nothing aforementioned, patient denied any shortness of breath, PND, orthopnea, palpitations, significant peripheral edema, weakness, fatigue, vertiginous symptoms, or presyncope/syncope. Patient with a past medical history significant for cardiovascular diagnoses. Documented physical exam was grossly benign, providing no evidence of acute exacerbation and/or decompensation of the patient's known cardiovascular conditions.  Patient suffered an inferior STEMI on 12/05/2016.  Emergent diagnostic left heart catheterization was performed revealing a reduced LVEF of 45%. LVEDP was severely elevated.  There was mild left ventricular systolic dysfunction.  Multivessel CAD noted; 30% stenosis of the mid to distal RCA, 80% stenosis of the proximal to mid LAD, and 100% stenosis of D1.  Of note, there were right to left collaterals and the first diagonal.  Culprit felt to be a thrombotic occlusion in his distal RCA.  Successful PCI was performed whereby a 3.5 x 15 mm Resolute Onyx DES x1 was placed to the distal RCA. Patient initiated on DAPT therapy with plans for staged PCI of the LAD in a few weeks.   Repeat diagnostic LEFT heart catheterization was performed on 12/23/2016 revealing a 30% stenosis of the mid to distal RCA, 100% stenosis of D1, and 80% stenosis of the proximal and mid LAD.  PCI was performed placing a 3.0 x 23 mm Svelte OTW DES the proximal to mid LAD.  Small diagonal branch jailed by the stent with loss of flow, however this branch noted to be subtotally occluded with adequate collateral flow.   Since his MI, patient has undergone serial monitoring of his cardiac function.  Most recent TTE performed on 05/19/2023 moderately reduced left ventricular systolic function with an EF of 30-35%. There was moderate LVH.  Global hypokinesis was observed.  Diastolic Doppler parameters were indeterminant.  Right ventricular size  and function normal  with a TAPSE measuring 1.3 cm  (normal range >/= 1.6 cm).  RVSP = 45.3 mmHg consistent with patient's known pulmonary hypertension diagnosis.left atrium was severely dilated.  There was mild to moderate mitral, tricuspid, and pulmonary valve regurgitation.  All transvalvular gradients were noted to be normal providing no evidence of hemodynamically significant valvular stenosis.  Borderline dilatation of the aortic root measuring up to 37 mm noted.  Patient underwent diagnostic RIGHT/LEFT heart catheterization on 05/26/2023.  Study revealed significant underlying three-vessel coronary artery disease; 100% D1, 70% mid LAD, 20% ostial LM, 40% mid-distal RCA, 40% distal RCA, and 95% ramus intermedius.  Previously placed stents in the distal RCA and LAD; moderate ISR in the distal RCA and significant ISR with RFR ratio of 0.83 in the LAD.  POBA was performed to the mid LAD lesion resulting in a 0% residual stenosis.  PCI was also performed placing a 3.0 x 24 mm Synergy XD DES x 1 to the ramus intermedius lesion.  Procedures yielded excellent angiographic result and TIMI-3 flow.  Hemodynamics: mean RA = 23 mmHg, mean PA = 60 mmHg, mean PCWP = 28 mmHg, AO saturation = 96%, PA saturation = 58%, CO = 4.33 L/min, and CI = 1.8 L/min/m.  Findings consistent with patient's known pulmonary hypertension diagnosis.  Patient was admitted for IV diuresis and heart failure optimization.  ARB (losartan ) was switched to ARB/ARNI (Entresto ).  Recommendations for adding spironolactone  and SGLT2i made by cardiology.  Following MRI and placement of multiple cardiac stents, patient remains daily DAPT therapy using ASA + clopidogrel .  Patient reportedly compliant with therapy with no evidence reports of GI/GU related bleeding.  HFrEF being managed on GDMT including beta-blocker (metoprolol  succinate), diuretic (spironolactone ), SGLT2i (empagliflozin ), and ARB/ARNI (Entresto ).  Blood pressure well-controlled at  122/84 mmHg on previously prescribed regimen.  Patient has a supply of short acting nitrates (NTG) to use on a as needed basis for recurrent angina/anginal equivalent symptoms; denied recent use.  Patient is on rosuvastatin  + ezetimibe  for his HLD diagnosis and further ASCVD prevention.  He is not diabetic.  He does not have an OSAH diagnosis. Patient is able to complete all of his  ADL/IADLs without cardiovascular limitation.  Per the [facility], patient is able to achieve at least 4 METS of physical activity without experiencing any significant degree of angina/anginal equivalent symptoms. No changes were made to his medication regimen during his visit with cardiology.  Patient scheduled to follow-up with outpatient cardiology in 6 months or sooner if needed.  Norleen JULIANNA Raven is scheduled for an elective EXAM UNDER ANESTHESIA, RECTUM; HEMORRHOIDECTOMY; BOTOX INJECTION on 11/30/2023 with Dr. Aloysius Plant, MD.  Given patient's past medical history significant for cardiovascular diagnoses, presurgical cardiac clearance was sought by the PAT team. Per cardiology, according to the [facility], patient has a 6.6-10.1% risk of MACE. Patient reports activity equivalent to 4.0 METS (walking for exercise twice a week and light household activities). Based on ACC/AHA guidelines, Griselda F Kott would be at ACCEPTABLE risk for the planned procedure without further cardiovascular testing.  Patient on daily DAPT therapy using ASA + clopidogrel .  He has been instructed on recommendations for holding his clopidogrel  for 5 days prior to his procedure with plans to restart it as soon as postoperative  bleeding risk has been minimized by his primary team surgeon.  Patient is aware that his last dose of clopidogrel  should be on 11/24/2023. Given that patient's past medical history is significant for cardiovascular diagnoses, including but not limited to CAD, general surgery has cleared patient to continue his daily low dose ASA throughout his  perioperative course. He will be asked to hold his normal dose on the day of his procedure only. Patient has been updated on these directives from his specialty care providers by the PAT team.  Patient denies previous perioperative complications with anesthesia in the past. In review his EMR, it is noted that patient underwent a general anesthetic course here at [facility] (ASA III) in 03/2021 without documented complications.   MOST RECENT VITAL SIGNS:    11/22/2023   10:53 AM 11/09/2023   10:23 AM 11/05/2023   10:55 AM  Vitals with BMI  Height 6' 1 6' 1 6' 1  Weight 243 lbs 246 lbs 10 oz 245 lbs 3 oz  BMI 32.07 32.54 32.36  Systolic  122 123  Diastolic  84 85  Pulse  72 80   PROVIDERS/SPECIALISTS: NOTE: Primary physician provider listed below. Patient may have been seen by APP or partner within same practice.   PROVIDER ROLE / SPECIALTY LAST SHERLEAN Desiderio Schanz, MD General Surgery (Surgeon) 11/05/2023  Donzella Lauraine SAILOR, DO Primary Care Provider 10/06/2023  Darron Grass, MD Cardiology 11/09/2023   ALLERGIES: Allergies  Allergen Reactions   Bextra [Valdecoxib] Swelling    CURRENT HOME MEDICATIONS: No current facility-administered medications for this encounter.    allopurinol  (ZYLOPRIM ) 100 MG tablet   aspirin  EC 81 MG tablet   Cholecalciferol  (DIALYVITE VITAMIN D  5000) 125 MCG (5000 UT) capsule   clopidogrel  (PLAVIX ) 75 MG tablet   colchicine  0.6 MG tablet   empagliflozin  (JARDIANCE ) 10 MG TABS tablet   ezetimibe  (ZETIA ) 10 MG tablet   fluticasone  (FLONASE ) 50 MCG/ACT nasal spray   hydrocortisone  cream 1 %   metoprolol  succinate (TOPROL -XL) 50 MG 24 hr tablet   nitroGLYCERIN  (NITROSTAT ) 0.4 MG SL tablet   omeprazole  (PRILOSEC  OTC) 20 MG tablet   Povidone, PF, (IVIZIA DRY EYES) 0.5 % SOLN   PRESCRIPTION MEDICATION   rosuvastatin  (CRESTOR ) 20 MG tablet   sacubitril -valsartan  (ENTRESTO ) 49-51 MG   spironolactone  (ALDACTONE ) 25 MG  tablet   Menaquinone-7 (VITAMIN K2 PO)   HISTORY: Past Medical History:  Diagnosis Date   Aortic root dilation (HCC)    a.) TTE 02/26/2021: mild; measured 40 mm.   BPH (benign prostatic hyperplasia)    a.) s/p HoLEP 03/2021   CAD (coronary artery disease)    a.) inferior STEMI 12/05/16: LHC/PCI --> LM nl, p-mLAD 80%, D1 CTO with R-L collats, LCx minor irregs,  m-dRCA 30%, dRCA 100% (3.5 x 15 mm Resolute Onyx DES); b.) staged PCI 12/23/16 -  p-mLAD (3.0 x 23 mm Svelte DES) with small diag being jailed by stent (vessel known to be subtotally occluded with R-L collats); c.) PCI 05/26/2023 70% mLAD ISR (POBA), 95% RI (3.0 x 24 mm Synergy DES)   Dyspnea    Family history of premature CAD    a. sister passed from MI at age 76   GERD (gastroesophageal reflux disease)    Gout    HFrEF (heart failure with reduced ejection fraction) (HCC)    a.) TTE 12/05/16: EF 50-55%, inf HK, GR1DD, mildly dilated LA. b.) TTE 02/26/21: EF 45-50%, inferolateral HK, mild MR, G2DD; c.)  TTE 05/19/2023: EF 30-35%, mod LVH, glob HK, RVSP 45.3, mild=mod MR/TR/PR, Ao root 37 mm   HLD (hyperlipidemia)    Hypertension    Internal hemorrhoids    LAFB (left anterior fascicular block)    Severe sepsis (HCC) 12/03/2020   ST elevation myocardial infarction (STEMI) of inferior wall (HCC) 12/05/2016   a.) LHC --> EF 45% with severely elevated LVEDP; 2v CAD; culprit was thrombotic occlusion of dRCA; D1 occluded with R-L collaterals; 80% p-mLAD. PCI performed placing a 3.5 x 15 mm Resolute Onyx DES x 1 to dRCA. Plans for staged PCI of LAD.   Past Surgical History:  Procedure Laterality Date   ABDOMINOPLASTY     CATARACT EXTRACTION Bilateral    COLONOSCOPY WITH PROPOFOL  N/A 06/29/2016   Procedure: COLONOSCOPY WITH PROPOFOL ;  Surgeon: Gladis MARLA Louder, MD;  Location: [facility];  Service: Endoscopy;  Laterality: N/A;   CORONARY BALLOON ANGIOPLASTY N/A 05/26/2023   Procedure: CORONARY BALLOON ANGIOPLASTY;  Surgeon: Darron Deatrice LABOR, MD;  Location: [facility];  Service: Cardiovascular;  Laterality: N/A;   CORONARY PRESSURE/FFR STUDY N/A 05/26/2023   Procedure: CORONARY PRESSURE/FFR STUDY;  Surgeon: Darron Deatrice LABOR, MD;  Location: [facility];  Service: Cardiovascular;  Laterality: N/A;   CORONARY STENT INTERVENTION N/A 12/23/2016   Procedure: Coronary Stent Intervention;  Surgeon: Darron Deatrice LABOR, MD;  Location: [facility];  Service: Cardiovascular;  Laterality: N/A;   CORONARY STENT INTERVENTION N/A 05/26/2023   Procedure: CORONARY STENT INTERVENTION;  Surgeon: Darron Deatrice LABOR, MD;  Location: [facility];  Service: Cardiovascular;  Laterality: N/A;   CORONARY/GRAFT ACUTE MI REVASCULARIZATION N/A 12/05/2016   Procedure: Coronary/Graft Acute MI Revascularization;  Surgeon: Darron Deatrice LABOR, MD;  Location: [facility] INVASIVE CV LAB;  Service: Cardiovascular;  Laterality: N/A;   DENTAL SURGERY     ganglion cyst removal Right    GAS INSERTION Left 11/21/2020   Procedure: INSERTION OF GAS LEFT EYE (C3F8);  Surgeon: Jarold Mayo, MD;  Location: [facility] OR;  Service: Ophthalmology;  Laterality: Left;   GAS/FLUID EXCHANGE Left 11/21/2020   Procedure: GAS/FLUID EXCHANGE;  Surgeon: Jarold Mayo, MD;  Location: [facility] OR;  Service: Ophthalmology;  Laterality: Left;   HOLEP-LASER ENUCLEATION OF THE PROSTATE WITH MORCELLATION N/A 03/25/2021   Procedure: HOLEP-LASER ENUCLEATION OF THE PROSTATE WITH MORCELLATION;  Surgeon: Francisca Redell BROCKS, MD;  Location: [facility] ORS;  Service: Urology;  Laterality: N/A;   LEFT HEART CATH AND CORONARY ANGIOGRAPHY N/A 12/05/2016   Procedure: Left Heart Cath and Coronary Angiography;  Surgeon: Darron Deatrice LABOR, MD;  Location: [facility] INVASIVE CV LAB;  Service: Cardiovascular;  Laterality: N/A;   NO PAST SURGERIES     Has has some cosmetic surgeries   RETINAL DETACHMENT SURGERY     RIGHT/LEFT HEART CATH AND CORONARY ANGIOGRAPHY N/A 05/26/2023   Procedure: RIGHT/LEFT HEART  CATH AND CORONARY ANGIOGRAPHY;  Surgeon: Darron Deatrice LABOR, MD;  Location: [facility];  Service: Cardiovascular;  Laterality: N/A;   TONSILLECTOMY     VITRECTOMY 25 GAUGE WITH SCLERAL BUCKLE Left 11/21/2020   Procedure: VITRECTOMY 25 GAUGE WITH SCLERAL BUCKLE;  Surgeon: Jarold Mayo, MD;  Location: [facility] OR;  Service: Ophthalmology;  Laterality: Left;   Family History  Problem Relation Age of Onset   Diabetes Mellitus II Mother    Cancer Father    Diabetes Mellitus II Sister    Diabetes Mellitus II Brother    Kidney failure Brother    Social History   Tobacco  Use   Smoking status: Never    Passive exposure: Never   Smokeless tobacco: Never  Substance Use Topics   Alcohol use: No   LABS:  Lab Results  Component Value Date   WBC 5.5 08/23/2023   HGB 16.9 08/23/2023   HCT 48.6 08/23/2023   MCV 91 08/23/2023   PLT 203 08/23/2023   Office Visit on 11/09/2023  Component Date Value Ref Range Status   Glucose 11/09/2023 99  70 - 99 mg/dL Final   BUN 93/96/7974 20  8 - 27 mg/dL Final   Creatinine, Ser 11/09/2023 1.40 (H)  0.76 - 1.27 mg/dL Final   eGFR 93/96/7974 55 (L)  >59 mL/min/1.73 Final   BUN/Creatinine Ratio 11/09/2023 14  10 - 24 Final   Sodium 11/09/2023 142  134 - 144 mmol/L Final   Potassium 11/09/2023 4.3  3.5 - 5.2 mmol/L Final   Chloride 11/09/2023 105  96 - 106 mmol/L Final   CO2 11/09/2023 20  20 - 29 mmol/L Final   Calcium  11/09/2023 8.7  8.6 - 10.2 mg/dL Final   Total Protein 93/96/7974 7.0  6.0 - 8.5 g/dL Final   Albumin 93/96/7974 4.5  3.9 - 4.9 g/dL Final   Globulin, Total 11/09/2023 2.5  1.5 - 4.5 g/dL Final   Bilirubin Total 11/09/2023 0.5  0.0 - 1.2 mg/dL Final   Alkaline Phosphatase 11/09/2023 76  44 - 121 IU/L Final   AST 11/09/2023 21  0 - 40 IU/L Final   ALT 11/09/2023 17  0 - 44 IU/L Final   Cholesterol, Total 11/09/2023 135  100 - 199 mg/dL Final   Triglycerides 93/96/7974 152 (H)  0 - 149 mg/dL Final   HDL 93/96/7974 36 (L)  >39 mg/dL  Final   VLDL Cholesterol Cal 11/09/2023 26  5 - 40 mg/dL Final   LDL Chol Calc (NIH) 11/09/2023 73  0 - 99 mg/dL Final   Chol/HDL Ratio 11/09/2023 3.8  0.0 - 5.0 ratio Final   Comment:                                   T. Chol/HDL Ratio                                             Men  Women                               1/2 Avg.Risk  3.4    3.3                                   Avg.Risk  5.0    4.4                                2X Avg.Risk  9.6    7.1                                3X Avg.Risk 23.4   11.0    Uric Acid 11/09/2023 5.5  3.8 - 8.4 mg/dL Final  Therapeutic target for gout patients: <6.0    ECG: Date: 11/09/2023 Time ECG obtained: 1027 AM Rate: 72 bpm Rhythm: normal sinus; LAFB Axis (leads I and aVF): left Intervals: PR 188 ms. QRS 116 ms. QTc 451 ms. ST segment and T wave changes: No evidence of acute T wave abnormalities or significant ST segment elevation or depression.  Evidence of a possible, age undetermined, prior infarct:  No Comparison: Similar to previous tracing obtained on 08/09/2023   IMAGING / PROCEDURES: LIMITED TRANSTHORACIC ECHOCARDIOGRAM performed on 09/03/2023 Left ventricular ejection fraction, by estimation, is 40 to 45%. The left ventricle has mild to moderately decreased function. There is moderate asymmetric left ventricular hypertrophy of the septal segment. Left ventricular diastolic parameters are consistent with Grade I diastolic dysfunction (impaired relaxation).  Right ventricular systolic function is normal.  The mitral valve is normal in structure. Mild mitral valve regurgitation.  The aortic valve is tricuspid. Aortic valve regurgitation is mild    RIGHT/LEFT HEART CATHETERIZATION AND CORONARY ANGIOGRAPHY performed on 05/26/2023 Moderately reduced left ventricular systolic function with an EF of 30 to 35%. Multivessel CAD 100% D1 70% mid LAD 20% ostial LM 40% mid to distal RCA 40% distal RCA 95% ramus  intermedius Successful intervention POBA mLAD 3.0 x 24 mm Synergy XD to the ramus intermedius Right heart catheterization showed severely elevated RA pressure, severe pulmonary hypertension, moderately severely elevated wedge pressure and severely reduced cardiac output. LVEDP was severely elevated, but in the setting of frequent PVCs.  Mean RA = 23 mmHg Mean PA = 60 mmHg Mean PCWP = 28 mmHg LVEDP = 41 mmHg AO saturation 96% PA saturation 58% CO = 4.33 L/min CI = 1.8 L/min/m     TRANSTHORACIC ECHOCARDIOGRAM performed on 05/19/2023 Left ventricular ejection fraction, by estimation, is 30 to 35%. Left ventricular ejection fraction by PLAX is 31 %. The left ventricle has moderately decreased function. The left ventricle demonstrates global hypokinesis. The left ventricular internal cavity size was moderately dilated. There is moderate left ventricular hypertrophy. Left ventricular diastolic parameters are indeterminate.  Right ventricular systolic function is moderately reduced. The right ventricular size is normal. There is moderately elevated pulmonary artery systolic pressure. The estimated right ventricular systolic pressure is 45.3 mmHg.  Left atrial size was severely dilated.  The mitral valve is normal in structure. Mild to moderate mitral valve regurgitation. No evidence of mitral stenosis.  The aortic valve is tricuspid. Aortic valve regurgitation is mild. No aortic stenosis is present.  There is borderline dilatation of the aortic root, measuring 37 mm.  The inferior vena cava is dilated in size with >50% respiratory variability, suggesting right atrial pressure of 8 mmHg.    IMPRESSION AND PLAN: Jeff Warren has been referred for pre-anesthesia review and clearance prior to him undergoing the planned anesthetic and procedural courses. Available labs, pertinent testing, and imaging results were personally reviewed by me in preparation for upcoming operative/procedural course. [facility] medical record has been updated following extensive record review and patient interview with PAT staff.   This patient has been appropriately cleared by cardiology with an overall ACCEPTABLE risk of patient experiencing significant perioperative cardiovascular complications. Based on clinical review performed today (11/29/23), barring any significant acute changes in the patient's overall condition, it is anticipated that he will be able to proceed with the planned surgical intervention. Any acute changes in clinical condition may necessitate his procedure being postponed and/or cancelled. Patient will meet with anesthesia team (MD and/or CRNA) on the day  of his procedure for preoperative evaluation/assessment. Questions regarding anesthetic course will be fielded at that time.   Pre-surgical instructions were reviewed with the patient during his PAT appointment, and questions were fielded to satisfaction by PAT clinical staff. He has been instructed on which medications that he will need to hold prior to surgery, as well as the ones that have been deemed safe/appropriate to take on the day of his procedure. As part of the general education provided by PAT, patient made aware both verbally and in writing, that he would need to abstain from the use of any illegal substances during his perioperative course. He was advised that failure to follow the provided instructions could necessitate case cancellation or result in serious perioperative complications up to and including death. Patient encouraged to contact PAT and/or his surgeon's office to discuss any questions or concerns that may arise prior to surgery; verbalized understanding.   Dorise Pereyra, MSN, APRN, FNP-C, CEN [facility]  Perioperative Services Nurse Practitioner Phone: [phone] Fax: [phone] 11/29/23 10:14 AM  NOTE: This note has been prepared using Scientist, clinical (histocompatibility and immunogenetics). Despite my best ability to  proofread, there is always the potential that unintentional transcriptional errors may still occur from this process.

## 2023-11-30 ENCOUNTER — Other Ambulatory Visit: Payer: Self-pay

## 2023-11-30 ENCOUNTER — Encounter
Admission: RE | Disposition: A | Discharge status: Home / Self Care | Payer: Self-pay | Source: Home / Self Care | Attending: Surgery

## 2023-11-30 ENCOUNTER — Encounter: Payer: Self-pay | Admitting: Surgery

## 2023-11-30 ENCOUNTER — Ambulatory Visit
Admission: RE | Admit: 2023-11-30 | Discharge: 2023-11-30 | Disposition: A | Discharge status: Home / Self Care | Attending: Surgery | Admitting: Surgery

## 2023-11-30 ENCOUNTER — Ambulatory Visit: Payer: Self-pay | Admitting: Urgent Care

## 2023-11-30 DIAGNOSIS — K219 Gastro-esophageal reflux disease without esophagitis: Secondary | ICD-10-CM | POA: Insufficient documentation

## 2023-11-30 DIAGNOSIS — K644 Residual hemorrhoidal skin tags: Secondary | ICD-10-CM | POA: Diagnosis not present

## 2023-11-30 DIAGNOSIS — K620 Anal polyp: Secondary | ICD-10-CM | POA: Insufficient documentation

## 2023-11-30 DIAGNOSIS — G4730 Sleep apnea, unspecified: Secondary | ICD-10-CM | POA: Insufficient documentation

## 2023-11-30 DIAGNOSIS — K6289 Other specified diseases of anus and rectum: Secondary | ICD-10-CM | POA: Diagnosis not present

## 2023-11-30 DIAGNOSIS — I252 Old myocardial infarction: Secondary | ICD-10-CM | POA: Insufficient documentation

## 2023-11-30 DIAGNOSIS — I088 Other rheumatic multiple valve diseases: Secondary | ICD-10-CM | POA: Insufficient documentation

## 2023-11-30 DIAGNOSIS — K5900 Constipation, unspecified: Secondary | ICD-10-CM | POA: Insufficient documentation

## 2023-11-30 DIAGNOSIS — R0602 Shortness of breath: Secondary | ICD-10-CM | POA: Insufficient documentation

## 2023-11-30 DIAGNOSIS — K602 Anal fissure, unspecified: Secondary | ICD-10-CM | POA: Diagnosis present

## 2023-11-30 DIAGNOSIS — Z7982 Long term (current) use of aspirin: Secondary | ICD-10-CM | POA: Insufficient documentation

## 2023-11-30 DIAGNOSIS — I110 Hypertensive heart disease with heart failure: Secondary | ICD-10-CM | POA: Diagnosis not present

## 2023-11-30 DIAGNOSIS — I2510 Atherosclerotic heart disease of native coronary artery without angina pectoris: Secondary | ICD-10-CM | POA: Diagnosis not present

## 2023-11-30 DIAGNOSIS — Z7984 Long term (current) use of oral hypoglycemic drugs: Secondary | ICD-10-CM | POA: Insufficient documentation

## 2023-11-30 DIAGNOSIS — Z7902 Long term (current) use of antithrombotics/antiplatelets: Secondary | ICD-10-CM | POA: Diagnosis not present

## 2023-11-30 DIAGNOSIS — I2582 Chronic total occlusion of coronary artery: Secondary | ICD-10-CM | POA: Insufficient documentation

## 2023-11-30 DIAGNOSIS — I5022 Chronic systolic (congestive) heart failure: Secondary | ICD-10-CM | POA: Insufficient documentation

## 2023-11-30 DIAGNOSIS — K648 Other hemorrhoids: Secondary | ICD-10-CM

## 2023-11-30 DIAGNOSIS — E039 Hypothyroidism, unspecified: Secondary | ICD-10-CM | POA: Insufficient documentation

## 2023-11-30 DIAGNOSIS — Z79899 Other long term (current) drug therapy: Secondary | ICD-10-CM | POA: Diagnosis not present

## 2023-11-30 HISTORY — DX: Other hemorrhoids: K64.8

## 2023-11-30 HISTORY — PX: RECTAL EXAM UNDER ANESTHESIA: SHX6399

## 2023-11-30 HISTORY — PX: HEMORRHOID SURGERY: SHX153

## 2023-11-30 HISTORY — PX: BOTOX INJECTION: SHX5754

## 2023-11-30 HISTORY — DX: Unspecified systolic (congestive) heart failure: I50.20

## 2023-11-30 SURGERY — EXAM UNDER ANESTHESIA, RECTUM
Anesthesia: General | Site: Rectum

## 2023-11-30 MED ORDER — SODIUM CHLORIDE (PF) 0.9 % IJ SOLN
INTRAMUSCULAR | Status: AC
  Filled 2023-11-30: qty 10

## 2023-11-30 MED ORDER — FENTANYL CITRATE (PF) 100 MCG/2ML IJ SOLN
INTRAMUSCULAR | Status: AC
  Filled 2023-11-30: qty 2

## 2023-11-30 MED ORDER — BUPIVACAINE-EPINEPHRINE (PF) 0.5% -1:200000 IJ SOLN
INTRAMUSCULAR | Status: AC
  Filled 2023-11-30: qty 30

## 2023-11-30 MED ORDER — CHLORHEXIDINE GLUCONATE 0.12 % MT SOLN
15.0000 mL | Freq: Once | OROMUCOSAL | Status: AC
  Administered 2023-11-30: 15 mL via OROMUCOSAL

## 2023-11-30 MED ORDER — PHENYLEPHRINE 80 MCG/ML (10ML) SYRINGE FOR IV PUSH (FOR BLOOD PRESSURE SUPPORT)
PREFILLED_SYRINGE | INTRAVENOUS | Status: DC | PRN
  Administered 2023-11-30: 80 ug via INTRAVENOUS
  Administered 2023-11-30: 40 ug via INTRAVENOUS
  Administered 2023-11-30 (×2): 80 ug via INTRAVENOUS

## 2023-11-30 MED ORDER — ORAL CARE MOUTH RINSE: 15.0000 mL | Freq: Once | OROMUCOSAL | Status: AC

## 2023-11-30 MED ORDER — ONDANSETRON HCL 4 MG/2ML IJ SOLN
INTRAMUSCULAR | Status: AC
  Filled 2023-11-30: qty 2

## 2023-11-30 MED ORDER — PROPOFOL 10 MG/ML IV BOLUS
INTRAVENOUS | Status: DC | PRN
  Administered 2023-11-30: 40 mg via INTRAVENOUS
  Administered 2023-11-30: 160 mg via INTRAVENOUS

## 2023-11-30 MED ORDER — LIDOCAINE HCL (PF) 2 % IJ SOLN
INTRAMUSCULAR | Status: AC
Start: 2023-11-30 — End: 2023-11-30
  Filled 2023-11-30: qty 5

## 2023-11-30 MED ORDER — BUPIVACAINE LIPOSOME 1.3 % IJ SUSP: 20.0000 mL | Freq: Once | INTRAMUSCULAR | Status: DC

## 2023-11-30 MED ORDER — BUPIVACAINE-EPINEPHRINE (PF) 0.5% -1:200000 IJ SOLN
INTRAMUSCULAR | Status: DC | PRN
  Administered 2023-11-30: 50 mL via INTRAMUSCULAR

## 2023-11-30 MED ORDER — GABAPENTIN 300 MG PO CAPS
ORAL_CAPSULE | ORAL | Status: AC
  Filled 2023-11-30: qty 1

## 2023-11-30 MED ORDER — ONDANSETRON HCL 4 MG/2ML IJ SOLN
INTRAMUSCULAR | Status: DC | PRN
  Administered 2023-11-30: 4 mg via INTRAVENOUS

## 2023-11-30 MED ORDER — LIDOCAINE HCL (CARDIAC) PF 100 MG/5ML IV SOSY
PREFILLED_SYRINGE | INTRAVENOUS | Status: DC | PRN
  Administered 2023-11-30: 100 mg via INTRAVENOUS

## 2023-11-30 MED ORDER — MIDAZOLAM HCL 2 MG/2ML IJ SOLN
INTRAMUSCULAR | Status: AC
Start: 2023-11-30 — End: 2023-11-30
  Filled 2023-11-30: qty 2

## 2023-11-30 MED ORDER — EPHEDRINE SULFATE-NACL 50-0.9 MG/10ML-% IV SOSY
PREFILLED_SYRINGE | INTRAVENOUS | Status: DC | PRN
  Administered 2023-11-30 (×3): 5 mg via INTRAVENOUS
  Administered 2023-11-30 (×2): 10 mg via INTRAVENOUS
  Administered 2023-11-30 (×2): 5 mg via INTRAVENOUS

## 2023-11-30 MED ORDER — GLYCOPYRROLATE 0.2 MG/ML IJ SOLN
INTRAMUSCULAR | Status: AC
  Filled 2023-11-30: qty 1

## 2023-11-30 MED ORDER — BUPIVACAINE LIPOSOME 1.3 % IJ SUSP
INTRAMUSCULAR | Status: AC
  Filled 2023-11-30: qty 20

## 2023-11-30 MED ORDER — PROPOFOL 10 MG/ML IV BOLUS
INTRAVENOUS | Status: AC
  Filled 2023-11-30: qty 40

## 2023-11-30 MED ORDER — GELATIN ABSORBABLE 12-7 MM EX MISC
CUTANEOUS | Status: AC
  Filled 2023-11-30: qty 1

## 2023-11-30 MED ORDER — FLEET ENEMA RE ENEM
1.0000 | ENEMA | Freq: Once | RECTAL | Status: AC
  Administered 2023-11-30: 1 via RECTAL

## 2023-11-30 MED ORDER — LACTATED RINGERS IV SOLN: INTRAVENOUS | Status: DC

## 2023-11-30 MED ORDER — EPHEDRINE 5 MG/ML INJ
INTRAVENOUS | Status: AC
  Filled 2023-11-30: qty 5

## 2023-11-30 MED ORDER — DEXAMETHASONE SODIUM PHOSPHATE 10 MG/ML IJ SOLN
INTRAMUSCULAR | Status: DC | PRN
  Administered 2023-11-30: 8 mg via INTRAVENOUS

## 2023-11-30 MED ORDER — ACETAMINOPHEN 500 MG PO TABS
1000.0000 mg | ORAL_TABLET | ORAL | Status: AC
  Administered 2023-11-30: 1000 mg via ORAL

## 2023-11-30 MED ORDER — FENTANYL CITRATE (PF) 100 MCG/2ML IJ SOLN
INTRAMUSCULAR | Status: DC | PRN
  Administered 2023-11-30 (×4): 25 ug via INTRAVENOUS

## 2023-11-30 MED ORDER — OXYCODONE HCL 5 MG PO TABS
5.0000 mg | ORAL_TABLET | ORAL | 0 refills | Status: DC | PRN
Start: 2023-11-30 — End: 2023-12-16

## 2023-11-30 MED ORDER — ONABOTULINUMTOXINA 100 UNITS IJ SOLR
100.0000 [IU] | Freq: Once | INTRAMUSCULAR | Status: AC
  Administered 2023-11-30: 50 [IU] via INTRAMUSCULAR
  Filled 2023-11-30: qty 100

## 2023-11-30 MED ORDER — SODIUM CHLORIDE 0.9 % IV SOLN
2.0000 g | INTRAVENOUS | Status: AC
  Administered 2023-11-30: 2 g via INTRAVENOUS

## 2023-11-30 MED ORDER — CHLORHEXIDINE GLUCONATE CLOTH 2 % EX PADS
6.0000 | MEDICATED_PAD | Freq: Once | CUTANEOUS | Status: AC
  Administered 2023-11-30: 6 via TOPICAL

## 2023-11-30 MED ORDER — ACETAMINOPHEN 500 MG PO TABS
ORAL_TABLET | ORAL | Status: AC
  Filled 2023-11-30: qty 2

## 2023-11-30 MED ORDER — PHENYLEPHRINE 80 MCG/ML (10ML) SYRINGE FOR IV PUSH (FOR BLOOD PRESSURE SUPPORT)
PREFILLED_SYRINGE | INTRAVENOUS | Status: AC
Start: 2023-11-30 — End: 2023-11-30
  Filled 2023-11-30: qty 10

## 2023-11-30 MED ORDER — CHLORHEXIDINE GLUCONATE CLOTH 2 % EX PADS: 6.0000 | MEDICATED_PAD | Freq: Once | CUTANEOUS | Status: DC

## 2023-11-30 MED ORDER — DEXMEDETOMIDINE HCL IN NACL 80 MCG/20ML IV SOLN
INTRAVENOUS | Status: DC | PRN
  Administered 2023-11-30 (×2): 4 ug via INTRAVENOUS

## 2023-11-30 MED ORDER — DEXAMETHASONE SODIUM PHOSPHATE 10 MG/ML IJ SOLN
INTRAMUSCULAR | Status: AC
Start: 2023-11-30 — End: 2023-11-30
  Filled 2023-11-30: qty 1

## 2023-11-30 MED ORDER — GELATIN ABSORBABLE 100 EX MISC
CUTANEOUS | Status: DC | PRN
  Administered 2023-11-30: 1

## 2023-11-30 MED ORDER — 0.9 % SODIUM CHLORIDE (POUR BTL) OPTIME
TOPICAL | Status: DC | PRN
  Administered 2023-11-30: 500 mL

## 2023-11-30 MED ORDER — GLYCOPYRROLATE 0.2 MG/ML IJ SOLN
INTRAMUSCULAR | Status: DC | PRN
  Administered 2023-11-30: .2 mg via INTRAVENOUS

## 2023-11-30 MED ORDER — MIDAZOLAM HCL 2 MG/2ML IJ SOLN
INTRAMUSCULAR | Status: DC | PRN
  Administered 2023-11-30: 2 mg via INTRAVENOUS

## 2023-11-30 MED ORDER — CHLORHEXIDINE GLUCONATE 0.12 % MT SOLN
OROMUCOSAL | Status: AC
  Filled 2023-11-30: qty 15

## 2023-11-30 MED ORDER — SODIUM CHLORIDE 0.9 % IV SOLN
INTRAVENOUS | Status: AC
  Filled 2023-11-30: qty 2

## 2023-11-30 MED ORDER — GABAPENTIN 300 MG PO CAPS
300.0000 mg | ORAL_CAPSULE | ORAL | Status: AC
  Administered 2023-11-30: 300 mg via ORAL

## 2023-11-30 MED ORDER — GABAPENTIN 300 MG PO CAPS: 300.0000 mg | ORAL_CAPSULE | Freq: Two times a day (BID) | ORAL | 0 refills | Status: DC

## 2023-11-30 MED ORDER — FLEET ENEMA RE ENEM: 1.0000 | ENEMA | Freq: Once | RECTAL | Status: DC

## 2023-11-30 MED ORDER — DEXMEDETOMIDINE HCL IN NACL 80 MCG/20ML IV SOLN
INTRAVENOUS | Status: AC
  Filled 2023-11-30: qty 20

## 2023-11-30 SURGICAL SUPPLY — 32 items
BRIEF MESH DISP 2XL (UNDERPADS AND DIAPERS) ×2 IMPLANT
DRAPE PERI LITHO V/GYN (MISCELLANEOUS) ×2 IMPLANT
DRAPE UNDER BUTTOCK W/FLU (DRAPES) ×2 IMPLANT
DRSG GAUZE FLUFF 36X18 (GAUZE/BANDAGES/DRESSINGS) ×2 IMPLANT
ELECTRODE CAUTERY BLDE TIP 2.5 (TIP) ×2 IMPLANT
ELECTRODE REM PT RTRN 9FT ADLT (ELECTROSURGICAL) ×2 IMPLANT
GLOVE SURG SYN 7.0 (GLOVE) ×3 IMPLANT
GLOVE SURG SYN 7.0 PF PI (GLOVE) ×2 IMPLANT
GLOVE SURG SYN 7.5 E (GLOVE) ×3 IMPLANT
GLOVE SURG SYN 7.5 PF PI (GLOVE) ×2 IMPLANT
GOWN STRL REUS W/ TWL LRG LVL3 (GOWN DISPOSABLE) ×4 IMPLANT
KIT TURNOVER KIT A (KITS) ×2 IMPLANT
LABEL OR SOLS (LABEL) ×2 IMPLANT
MANIFOLD NEPTUNE II (INSTRUMENTS) ×2 IMPLANT
NDL HYPO 22X1.5 SAFETY MO (MISCELLANEOUS) ×2 IMPLANT
NDL HYPO 25X1 1.5 SAFETY (NEEDLE) IMPLANT
NEEDLE HYPO 22X1.5 SAFETY MO (MISCELLANEOUS) ×1 IMPLANT
NEEDLE HYPO 25X1 1.5 SAFETY (NEEDLE) ×1 IMPLANT
NS IRRIG 500ML POUR BTL (IV SOLUTION) ×2 IMPLANT
PACK BASIN MINOR ARMC (MISCELLANEOUS) ×2 IMPLANT
PAD ABD DERMACEA PRESS 5X9 (GAUZE/BANDAGES/DRESSINGS) IMPLANT
PAD OB MATERNITY 11 LF (PERSONAL CARE ITEMS) ×2 IMPLANT
PAD PREP OB/GYN DISP 24X41 (PERSONAL CARE ITEMS) ×2 IMPLANT
SHEARS HARMONIC 9CM CVD (BLADE) IMPLANT
SOLUTION PREP PVP 2OZ (MISCELLANEOUS) ×2 IMPLANT
SURGILUBE 2OZ TUBE FLIPTOP (MISCELLANEOUS) ×2 IMPLANT
SUT VIC AB 2-0 SH 27XBRD (SUTURE) ×4 IMPLANT
SYR 10ML LL (SYRINGE) ×4 IMPLANT
SYR 20ML LL LF (SYRINGE) ×2 IMPLANT
SYR TB 1ML LL NO SAFETY (SYRINGE) IMPLANT
TRAP FLUID SMOKE EVACUATOR (MISCELLANEOUS) ×2 IMPLANT
WATER STERILE IRR 500ML POUR (IV SOLUTION) ×2 IMPLANT

## 2023-11-30 NOTE — Interval H&P Note (Signed)
 History and Physical Interval Note:  11/30/2023 7:09 AM  Jeff Warren  has presented today for surgery, with the diagnosis of anal fissure internal hemorrhoids anal polyp.  The various methods of treatment have been discussed with the patient and family. After consideration of risks, benefits and other options for treatment, the patient has consented to  Procedure(s): EXAM UNDER ANESTHESIA, RECTUM (N/A) HEMORRHOIDECTOMY (N/A) BOTOX INJECTION (N/A) as a surgical intervention.  The patient's history has been reviewed, patient examined, no change in status, stable for surgery.  I have reviewed the patient's chart and labs.  Questions were answered to the patient's satisfaction.     [name]

## 2023-11-30 NOTE — Discharge Instructions (Signed)
 Discharge Instructions: 1.  Patient may shower, but do not scrub wounds heavily and dab dry only. 2.  May do Sitz baths twice daily and/or after bowel movements to help soothe the raw tissues. 3.  May apply fluffed gauze over the perianal area to help with any drainage and for padding/comfort. 4.  You had a dissolvable gauze rolled in the anal canal.  You may pass this when passing gas or stool.  Please discard and do not reinsert. 5.  You may resume your Aspirin  on 12/01/23. 6.  You may resume your Plavix  on 12/02/23. 7.  It is normal for there to be some oozing after this surgery, particularly with Aspirin  and Plavix  on board.  This should stop on its own, but if worsening or persistent, please contact us  or come to the Emergency Room. 8.  You have been prescribed a narcotic to help for pain control.  This may cause constipation, so continue taking Miralax and/or stool softeners to help. 9.  Do not drive while taking narcotics for pain control.  Prior to driving, make sure you are able to rotate right and left to look at blindspots without significant pain or discomfort. 10.  Avoid strenuous activity for the next two weeks.

## 2023-11-30 NOTE — Transfer of Care (Signed)
 Immediate Anesthesia Transfer of Care Note  Patient: Jeff Warren  Procedure(s) Performed: ERASMO UNDER ANESTHESIA, RECTUM (Rectum) HEMORRHOIDECTOMY (Rectum) BOTOX INJECTION (Rectum)  Patient Location: PACU  Anesthesia Type:General  Level of Consciousness: drowsy  Airway & Oxygen Therapy: Patient Spontanous Breathing and Patient connected to face mask oxygen  Post-op Assessment: Report given to RN and Post -op Vital signs reviewed and stable  Post vital signs: Reviewed and stable  Last Vitals:  Vitals Value Taken Time  BP    Temp    Pulse 67 11/30/23 09:00  Resp 18 11/30/23 09:00  SpO2 97 % 11/30/23 09:00  Vitals shown include unfiled device data.  Last Pain:  Vitals:   11/30/23 9367  PainSc: 2          Complications: No notable events documented.

## 2023-11-30 NOTE — Anesthesia Preprocedure Evaluation (Signed)
 Anesthesia Evaluation  Patient identified by MRN, date of birth, ID band Patient awake    Reviewed: Allergy & Precautions, H&P , NPO status , Patient's Chart, lab work & pertinent test results, reviewed documented beta blocker date and time   Airway Mallampati: III  TM Distance: >3 FB Neck ROM: full    Dental  (+) Teeth Intact   Pulmonary shortness of breath, sleep apnea    Pulmonary exam normal        Cardiovascular Exercise Tolerance: Good hypertension, (-) angina + CAD and + Past MI  (-) Orthopnea, (-) PND and (-) DOE Normal cardiovascular exam+ dysrhythmias  Rate:Normal     Neuro/Psych negative neurological ROS  negative psych ROS   GI/Hepatic Neg liver ROS,GERD  Medicated,,  Endo/Other  Hypothyroidism    Renal/GU Renal disease  negative genitourinary   Musculoskeletal   Abdominal   Peds  Hematology negative hematology ROS (+)   Anesthesia Other Findings   Reproductive/Obstetrics negative OB ROS                             Anesthesia Physical Anesthesia Plan  ASA: 3  Anesthesia Plan: General LMA   Post-op Pain Management:    Induction:   PONV Risk Score and Plan: 3  Airway Management Planned:   Additional Equipment:   Intra-op Plan:   Post-operative Plan:   Informed Consent: I have reviewed the patients History and Physical, chart, labs and discussed the procedure including the risks, benefits and alternatives for the proposed anesthesia with the patient or authorized representative who has indicated his/her understanding and acceptance.       Plan Discussed with: CRNA  Anesthesia Plan Comments:        Anesthesia Quick Evaluation

## 2023-11-30 NOTE — Op Note (Signed)
 Procedure Date:  11/30/2023  Pre-operative Diagnosis: Posterior anal fissure Internal and external hemorrhoids Anal mass  Post-operative Diagnosis: Posterior anal fissure with associated skin tag Enlarged internal and external hemorrhoids of right anterior and left lateral columns Posterior anal mass  Procedures:   Exam under Anesthesia Chemical sphincterotomy with Botox. Hemorrhoidectomy of right anterior column and left lateral internal hemorrhoid Excision of posterior skin tag Excision of posterior anal mass  Surgeon:  [name], MD  Anesthesia:  General endotracheal  Estimated Blood Loss:  10 ml  Specimens: Posterior skin tag Posterior anal mass Right anterior hemorrhoid column Left lateral internal hemorrhoid  Complications:  None  Indications for Procedure:  This is a 68 y.o. male with a history of a non-healing posterior anal fissure, internal and external hemorrhoids, and an anal mass.  The risks of bleeding, infection, bowel injury, and need for further procedures were all discussed with the patient and he was willing to proceed.   Description of Procedure: The patient was correctly identified in the preoperative area and brought into the operating room.  The patient was placed supine with VTE prophylaxis in place.  Appropriate time-outs were performed.  Anesthesia was induced and the patient was intubated.  Appropriate antibiotics were infused.  The patient was then placed in high lithotomy position.   The perianal area was prepped and draped in usual sterile fashion.  The sphincter was digitally dilated.  Then the anoscope was inserted and the anal canal was evaluated, revealing a wide posterior anal fissure, measuring about 1.5 cm, which had an adjacent skin tag likely from chronic healing.  He also had a pedunculated posterior anal mass that seemed to be arising from the internal hemorrhoid pedicle posteriorly.  The mass itself was soft at the base and firm  at the head without any ulceration noted.  He also had enlarged internal and external hemorrhoids in the right anterior and left lateral columns.  The bivalve retractor was then inserted, and we started with the posterior skin tag.  Cautery was used to incise the skin surrounding the skin tag and this was taken down to reach the fissure itself.  This was sent to pathology.  We then injected 50 units of Botox into the internal sphincter, mostly concentrated around the fissure itself.  Then we proceeded to excise the posterior anal mass.  Using Harmonic, with careful attention not to injure the sphincter muscle, the mass was excised at its base, incorporating the internal hemorrhoid pedicle.  Then, the right anterior hemorrhoid column was excised by first incising into the external component using cautery and following internally using the Harmonic.  Careful attention at all times to avoid sphincter injury.  Given that he was having two areas already resected including posterior and anterior, it was decided not to excise the remaining left lateral external hemorrhoid to avoid anal verge stenosis.  However, I did remove a portion of the internal component using the Harmonic so he does not feel too much of a bulge with bowel movements.  After all this was completed, the anal canal was irrigated and cautery was used for hemostasis.  50 ml of Exparel  solution was infiltrated into the perianal area and as bilateral pudendal blocks.  A large gelfoam gauze was rolled and inserted into the anal canal for further hemostasis.  The perianal area was cleaned and dressed with fluffed gauze and mesh underwear.  The patient was emerged from anesthesia and extubated and brought to the recovery room for further management.  The patient tolerated the procedure well and all counts were correct at the end of the case.   [name], MD

## 2023-11-30 NOTE — Anesthesia Procedure Notes (Signed)
 Procedure Name: LMA Insertion Date/Time: 11/30/2023 7:37 AM  Performed by: Duwayne Craven, CRNAPre-anesthesia Checklist: Patient identified, Patient being monitored, Timeout performed, Emergency Drugs available and Suction available Patient Re-evaluated:Patient Re-evaluated prior to induction Oxygen Delivery Method: Circle system utilized Preoxygenation: Pre-oxygenation with 100% oxygen Induction Type: IV induction Ventilation: Mask ventilation without difficulty LMA: LMA inserted LMA Size: 5.0 Tube type: Oral Number of attempts: 1 Placement Confirmation: positive ETCO2 and breath sounds checked- equal and bilateral Tube secured with: Tape Dental Injury: Teeth and Oropharynx as per pre-operative assessment

## 2023-12-01 LAB — SURGICAL PATHOLOGY

## 2023-12-02 ENCOUNTER — Other Ambulatory Visit: Payer: Self-pay

## 2023-12-02 ENCOUNTER — Ambulatory Visit: Payer: Self-pay | Admitting: Surgery

## 2023-12-02 ENCOUNTER — Encounter: Payer: Self-pay | Admitting: Surgery

## 2023-12-02 ENCOUNTER — Emergency Department
Admission: EM | Admit: 2023-12-02 | Discharge: 2023-12-02 | Discharge status: Against Medical Advice | Attending: Emergency Medicine | Admitting: Emergency Medicine

## 2023-12-02 DIAGNOSIS — Z5321 Procedure and treatment not carried out due to patient leaving prior to being seen by health care provider: Secondary | ICD-10-CM | POA: Diagnosis not present

## 2023-12-02 DIAGNOSIS — R339 Retention of urine, unspecified: Secondary | ICD-10-CM | POA: Insufficient documentation

## 2023-12-02 LAB — URINALYSIS, ROUTINE W REFLEX MICROSCOPIC
Bilirubin Urine: NEGATIVE
Glucose, UA: >=500 mg/dL — AB
Hgb urine dipstick: NEGATIVE
Ketones, ur: 5 mg/dL — AB
Leukocytes,Ua: NEGATIVE
Nitrite: NEGATIVE
Protein, ur: NEGATIVE mg/dL
Specific Gravity, Urine: 1.017 (ref 1.005–1.030)
Squamous Epithelial / HPF: 0 /HPF (ref 0–5)
pH: 6 (ref 5.0–8.0)

## 2023-12-02 NOTE — ED Triage Notes (Deleted)
 Patient C/O urinary retention that began on Tuesday after having a hemorrhoidectomy on Tuesday and has worsened. This has happened in the past with previous procedures he's had. Patient denies any hematuria, fever, or any other symptoms at this time.

## 2023-12-02 NOTE — ED Triage Notes (Signed)
 Patient C/O urinary retention that began on Tuesday and has worsened that began after having a hemorrhoidectomy. Patient states that he has had this problem in the past with previous procedures he has had done. Patient denies any hematuria, fevers, or other infectious symptoms.

## 2023-12-06 NOTE — Anesthesia Postprocedure Evaluation (Signed)
 Anesthesia Post Note  Patient: Jeff Warren  Procedure(s) Performed: ERASMO UNDER ANESTHESIA, RECTUM (Rectum) HEMORRHOIDECTOMY (Rectum) BOTOX  INJECTION (Rectum)  Patient location during evaluation: PACU Anesthesia Type: General Level of consciousness: awake and alert Pain management: pain level controlled Vital Signs Assessment: post-procedure vital signs reviewed and stable Respiratory status: spontaneous breathing, nonlabored ventilation, respiratory function stable and patient connected to nasal cannula oxygen Cardiovascular status: blood pressure returned to baseline and stable Postop Assessment: no apparent nausea or vomiting Anesthetic complications: no   No notable events documented.   Last Vitals:  Vitals:   11/30/23 0915 11/30/23 0930  BP: 105/65 128/68  Pulse: 66 71  Resp: 17 17  Temp:    SpO2: 97% 99%    Last Pain:  Vitals:   11/30/23 0930  PainSc: 0-No pain                 Lynwood KANDICE Clause

## 2023-12-13 ENCOUNTER — Ambulatory Visit (INDEPENDENT_AMBULATORY_CARE_PROVIDER_SITE_OTHER): Admitting: Surgery

## 2023-12-13 ENCOUNTER — Encounter: Payer: Self-pay | Admitting: Surgery

## 2023-12-13 VITALS — BP 122/84 | Temp 98.4°F | Ht 73.0 in | Wt 233.0 lb

## 2023-12-13 DIAGNOSIS — K6289 Other specified diseases of anus and rectum: Secondary | ICD-10-CM

## 2023-12-13 DIAGNOSIS — K648 Other hemorrhoids: Secondary | ICD-10-CM

## 2023-12-13 DIAGNOSIS — K602 Anal fissure, unspecified: Secondary | ICD-10-CM

## 2023-12-13 DIAGNOSIS — K644 Residual hemorrhoidal skin tags: Secondary | ICD-10-CM

## 2023-12-13 DIAGNOSIS — Z09 Encounter for follow-up examination after completed treatment for conditions other than malignant neoplasm: Secondary | ICD-10-CM

## 2023-12-13 NOTE — Progress Notes (Signed)
 12/13/2023  HPI: Jeff Warren is a 68 y.o. male s/p Exam under Anesthesia, excision of posterior anal skin tag, chemical sphincterotomy with Botox , excision of posterior anal mass, excision of right anterior hemorrhoid column on 11/30/23.  Patient presents for follow up.  He had post-operative urinary retention and he presented to the ED on 12/02/23 and had a Foley catheter placed.  He reports having about 750 ml out as soon as the catheter went in.  He has an appointment with Dr. Francisca on 12/16/23.  From the surgical standpoint, he reports that he has been improving.  He noted more pain the first few days but has not needed Oxycodone  recently and he also stopped the Gabapentin  yesterday.  He does report some discomfort with bowel movements but this has been improving.  Reports some pink mucous drainage on the gauze, but no bleeding.  Vital signs: Temp 98.4 F (36.9 C) (Oral)   Ht 6' 1 (1.854 m)   SpO2 98%   BMI 31.66 kg/m    Physical Exam: Constitutional:  No acute distress Rectal:  External exam shows open wounds from his hemorrhoidectomy and skin tag excision, with healthy granulation tissue, without any apparent stenosis, and without bleeding.  Fissure area is also getting granulation tissue to heal.  DRE deferred today.  Assessment/Plan: This is a 68 y.o. male s/p EUA, posterior anal skin tag excision, chemical sphincterotomy with Botox , posterior anal mass excision, and hemorrhoidectomy  --Discussed with the patient that the raw wounds are starting to heal and building granulation tissue, including the area of the fissure.  There'll still be soreness with bowel movements until this heals further.  The Botox  should start helping with the sphincter tone and continue relaxing the muscle.  --Discussed again the rationale for not excising the left lateral hemorrhoid to prevent stenosis.  --Continue with bowel regimen, pain medications as needed. --Follow up in 3 weeks.  [name],  MD [facility]

## 2023-12-13 NOTE — Patient Instructions (Signed)
 Surgical Procedures for Hemorrhoids, Care After After surgery for hemorrhoids, it is common to have: Pain in your rectum for 2-4 weeks after the procedure, and may take 1-2 months to fully recover. Pain when you are pooping. A small amount of bleeding from your rectum. This is more likely to happen the first time you poop after surgery. Follow these instructions at home: Medicines Take over-the-counter and prescription medicines only as told by your health care provider. If you were prescribed antibiotics, use them as told by your provider. Do not stop using the antibiotic even if you start to feel better. Ask your provider if the medicine prescribed to you requires you to avoid driving or using machinery. Use a stool softener or a medicine that helps you poop (laxative) as told by your provider. Eating and drinking Follow instructions from your provider about what you may eat and drink after the surgery. You may need to take these actions to prevent or treat constipation: Drink enough fluid to keep your pee (urine) pale yellow. Take over-the-counter or prescription medicines. Eat foods that are high in fiber, such as beans, whole grains, and fresh fruits and vegetables. Limit foods that are high in fat and processed sugars, such as fried or sweet foods. Managing pain and swelling  Take warm sitz baths for 15-20 minutes, 2-3 times a day to ease soreness and itching. This will also help keep the rectal area clean. If told, put ice on the affected area. It may help to use ice packs between sitz baths. Put ice in a plastic bag. Place a towel between your skin and the bag. Leave the ice on for 20 minutes, 2-3 times a day. If your skin turns bright red, remove the ice right away to prevent skin damage. The risk of damage is higher if you cannot feel pain, heat, or cold. Activity Rest as told by your provider. Do not sit for a long time without moving. Get up to take short walks every 1-2  hours. This will improve blood flow and breathing. Ask for help if you feel weak or unsteady. You may have to avoid lifting. Ask your provider how much you can safely lift. Return to your normal activities as told by your provider. Ask your provider what activities are safe for you. General instructions Do not strain to poop. Do not spend a long time sitting on the toilet. If you were given a sedative during the surgery, it can affect you for several hours. Do not drive or operate machinery until your provider says that it is safe. Your provider may give you more instructions. Make sure you know what you can and cannot do. Contact a health care provider if: Your pain medicine is not helping. You have a fever or chills. You have drainage that smells bad. You have a lot of swelling. You become constipated. You have trouble peeing (urinating). You have very bad pain in your rectum. Get help right away if: You are bleeding a lot from your rectum. This information is not intended to replace advice given to you by your health care provider. Make sure you discuss any questions you have with your health care provider. Document Revised: 01/16/2022 Document Reviewed: 01/16/2022 Elsevier Patient Education  2024 [employer].

## 2023-12-16 ENCOUNTER — Ambulatory Visit (INDEPENDENT_AMBULATORY_CARE_PROVIDER_SITE_OTHER): Admitting: Urology

## 2023-12-16 ENCOUNTER — Ambulatory Visit: Admitting: Urology

## 2023-12-16 VITALS — BP 110/79 | HR 80 | Ht 73.0 in | Wt 233.0 lb

## 2023-12-16 VITALS — BP 104/68 | HR 60 | Ht 73.0 in | Wt 233.0 lb

## 2023-12-16 DIAGNOSIS — N9989 Other postprocedural complications and disorders of genitourinary system: Secondary | ICD-10-CM

## 2023-12-16 DIAGNOSIS — R338 Other retention of urine: Secondary | ICD-10-CM

## 2023-12-16 DIAGNOSIS — Z466 Encounter for fitting and adjustment of urinary device: Secondary | ICD-10-CM

## 2023-12-16 DIAGNOSIS — N401 Enlarged prostate with lower urinary tract symptoms: Secondary | ICD-10-CM

## 2023-12-16 LAB — BLADDER SCAN

## 2023-12-16 MED ORDER — NITROFURANTOIN MONOHYD MACRO 100 MG PO CAPS
100.0000 mg | ORAL_CAPSULE | Freq: Once | ORAL | Status: AC
  Administered 2023-12-16: 100 mg via ORAL

## 2023-12-16 NOTE — Progress Notes (Signed)
   Jeff Warren is a 68 y.o. man who returns this afternoon after his Foley catheter was removed for voiding trial this morning.  He had a history of a HoLEP for BPH back in 2022.  He had been voiding well since that time.  He underwent the recent excision of the posterior anal mass and sphincterectomy and developed postoperative retention.  He states that since this morning after Foley catheter was removed, he has been voiding well.  His bladder scan in the office notes 62 mL.  Patient denies any modifying or aggravating factors.  Patient denies any recent UTI's, gross hematuria, dysuria or suprapubic/flank pain.  Patient denies any fevers, chills, nausea or vomiting.   It was discussed with patient that if he has any future planned procedures that will require general anesthesia to contact us  so that we may place a catheter just before the procedure to prevent any postoperative retention.  Follow up prn.

## 2023-12-16 NOTE — Progress Notes (Signed)
   12/16/2023 8:37 AM   Norleen JULIANNA Raven 03/09/1956 [id number]  Reason for visit: Post op urinary retention, history of HoLEP for BPH  HPI: 68 year old male who originally presented with Foley dependent urinary retention, 175 g prostate, elevated PSA of 19, and urodynamic showing a functional bladder.  He underwent uncomplicated HOLEP 03/25/2021 with removal of 80 g of benign tissue, postop PSA decreased appropriately to 1.5.  He overall had been doing very well, when I last saw him in June 2024 PVR was normal at 0 mL and he really had no significant urinary complaints.  He underwent a recent excision of posterior anal mass and sphincterotomy with Dr. Desiderio and developed postoperative retention.  He presented to the ER and a catheter was placed with return of urine.  He denied any urinary symptoms prior to having the surgical procedure.  He does have a history of retention after anesthesia in the past.  Foley was removed today and nitrofurantoin  for prophylaxis.  He returned this afternoon, and PVR normal at 62ml.  Follow up with urology as needed If general anesthesia planned, consider foley and continuing 3-5 days post-op with his history of anesthesia induced retention   Redell JAYSON Burnet, MD  [facility] [address], [address] [place], [place] [address] [phone]

## 2023-12-16 NOTE — Progress Notes (Signed)
 Catheter Removal  Patient is present today for a catheter removal.  9ml of water  was drained from the balloon. A 16FR foley cath was removed from the bladder, no complications were noted. Patient tolerated well.  Performed by: Beauford Browner, CMA  Follow up/ Additional notes: Patient will return to clinic this afternoon for bladder scan.

## 2024-01-03 ENCOUNTER — Encounter: Payer: Self-pay | Admitting: Surgery

## 2024-01-03 ENCOUNTER — Ambulatory Visit (INDEPENDENT_AMBULATORY_CARE_PROVIDER_SITE_OTHER): Admitting: Surgery

## 2024-01-03 VITALS — BP 134/92 | HR 72 | Temp 98.0°F | Ht 73.0 in | Wt 237.0 lb

## 2024-01-03 DIAGNOSIS — K6289 Other specified diseases of anus and rectum: Secondary | ICD-10-CM

## 2024-01-03 DIAGNOSIS — K644 Residual hemorrhoidal skin tags: Secondary | ICD-10-CM

## 2024-01-03 DIAGNOSIS — K602 Anal fissure, unspecified: Secondary | ICD-10-CM

## 2024-01-03 DIAGNOSIS — Z09 Encounter for follow-up examination after completed treatment for conditions other than malignant neoplasm: Secondary | ICD-10-CM

## 2024-01-03 DIAGNOSIS — K648 Other hemorrhoids: Secondary | ICD-10-CM

## 2024-01-03 NOTE — Progress Notes (Signed)
 01/03/2024  HPI: Jeff Warren is a 68 y.o. male s/p exam under anesthesia, chemical sphincterotomy with Botox , hemorrhoidectomy of 2 columns, excision of posterior skin tag associated with anal fissure and excision of a posterior anal mass on 11/30/2023.  Final pathology of the mass was consistent with hemorrhoidal tissue.  Patient presents today for follow-up.  He reports that the pain and the bleeding have continued to improve.  He reports still having discomfort during bowel movements and he has noticed some small amount of blood when wiping after bowel movement.  He did have 1 episode at nighttime that he noticed blood in his underwear but denies any episodes of profuse bleeding.  Vital signs: BP (!) 134/92   Pulse 72   Temp 98 F (36.7 C) (Oral)   Ht 6' 1 (1.854 m)   Wt 237 lb (107.5 kg)   SpO2 98%   BMI 31.27 kg/m    Physical Exam: Constitutional: No acute distress Rectal: External exam reveals posterior healing tissue from the anal fissure, posterior skin tag, and hemorrhoidectomy.  There is healthy granulation tissue covering the full depth of the wounds and now with some skin growing over.  Digital rectal exam does not reveal any stenosis and has appropriate sphincter tone.  No gross blood noted.  Assessment/Plan: This is a 68 y.o. male s/p exam under anesthesia, chemical sphincterotomy with Botox , hemorrhoidectomy of right anterior colon and left lateral internal hemorrhoid, excision of posterior skin tag, and excision of posterior anal mass.  - Discussed with the patient that based on the extent of resection that was done and the width of his fissure, it is not uncommon to still be having soreness with bowel movements.  Currently the granulation tissue is forming and scar tissue is starting to contract.  Does when he is having bowel movements, the stool pushes through this and creates the discomfort and sometimes bleeding.  The fact that he is on aspirin  and Plavix  also increases the  risk of bleeding which is why he may have noticed blood in his underwear that 1 night.  Currently the tissues are healing appropriately and I do not see any complications at this point.  I think this will continue to improve as skin is starting to cover the wounds. - Continue bowel regiment to keep the stool soft. - Follow-up in 1 month to reassess his progress.   Jeff Sheree Plant, MD [facility]

## 2024-01-03 NOTE — Patient Instructions (Signed)
 Surgical Procedures for Hemorrhoids, Care After After surgery for hemorrhoids, it is common to have: Pain in your rectum for 2-4 weeks after the procedure, and may take 1-2 months to fully recover. Pain when you are pooping. A small amount of bleeding from your rectum. This is more likely to happen the first time you poop after surgery. Follow these instructions at home: Medicines Take over-the-counter and prescription medicines only as told by your health care provider. If you were prescribed antibiotics, use them as told by your provider. Do not stop using the antibiotic even if you start to feel better. Ask your provider if the medicine prescribed to you requires you to avoid driving or using machinery. Use a stool softener or a medicine that helps you poop (laxative) as told by your provider. Eating and drinking Follow instructions from your provider about what you may eat and drink after the surgery. You may need to take these actions to prevent or treat constipation: Drink enough fluid to keep your pee (urine) pale yellow. Take over-the-counter or prescription medicines. Eat foods that are high in fiber, such as beans, whole grains, and fresh fruits and vegetables. Limit foods that are high in fat and processed sugars, such as fried or sweet foods. Managing pain and swelling  Take warm sitz baths for 15-20 minutes, 2-3 times a day to ease soreness and itching. This will also help keep the rectal area clean. If told, put ice on the affected area. It may help to use ice packs between sitz baths. Put ice in a plastic bag. Place a towel between your skin and the bag. Leave the ice on for 20 minutes, 2-3 times a day. If your skin turns bright red, remove the ice right away to prevent skin damage. The risk of damage is higher if you cannot feel pain, heat, or cold. Activity Rest as told by your provider. Do not sit for a long time without moving. Get up to take short walks every 1-2  hours. This will improve blood flow and breathing. Ask for help if you feel weak or unsteady. You may have to avoid lifting. Ask your provider how much you can safely lift. Return to your normal activities as told by your provider. Ask your provider what activities are safe for you. General instructions Do not strain to poop. Do not spend a long time sitting on the toilet. If you were given a sedative during the surgery, it can affect you for several hours. Do not drive or operate machinery until your provider says that it is safe. Your provider may give you more instructions. Make sure you know what you can and cannot do. Contact a health care provider if: Your pain medicine is not helping. You have a fever or chills. You have drainage that smells bad. You have a lot of swelling. You become constipated. You have trouble peeing (urinating). You have very bad pain in your rectum. Get help right away if: You are bleeding a lot from your rectum. This information is not intended to replace advice given to you by your health care provider. Make sure you discuss any questions you have with your health care provider. Document Revised: 01/16/2022 Document Reviewed: 01/16/2022 Elsevier Patient Education  2024 [employer].

## 2024-01-31 ENCOUNTER — Encounter: Payer: Self-pay | Admitting: Surgery

## 2024-01-31 ENCOUNTER — Other Ambulatory Visit: Payer: Self-pay | Admitting: Cardiovascular Disease

## 2024-01-31 ENCOUNTER — Ambulatory Visit (INDEPENDENT_AMBULATORY_CARE_PROVIDER_SITE_OTHER): Admitting: Surgery

## 2024-01-31 VITALS — BP 140/89 | HR 66 | Temp 98.0°F | Ht 73.0 in | Wt 238.0 lb

## 2024-01-31 DIAGNOSIS — Z09 Encounter for follow-up examination after completed treatment for conditions other than malignant neoplasm: Secondary | ICD-10-CM

## 2024-01-31 DIAGNOSIS — K648 Other hemorrhoids: Secondary | ICD-10-CM

## 2024-01-31 DIAGNOSIS — K602 Anal fissure, unspecified: Secondary | ICD-10-CM

## 2024-01-31 DIAGNOSIS — K644 Residual hemorrhoidal skin tags: Secondary | ICD-10-CM

## 2024-01-31 MED ORDER — LIDOCAINE-PRILOCAINE 2.5-2.5 % EX CREA: 1.0000 | TOPICAL_CREAM | CUTANEOUS | 0 refills | Status: DC | PRN

## 2024-01-31 MED ORDER — LIDOCAINE 5 % EX OINT: 1.0000 | TOPICAL_OINTMENT | Freq: Four times a day (QID) | CUTANEOUS | 0 refills | Status: AC | PRN

## 2024-01-31 MED ORDER — LIDOCAINE 5 % EX OINT: 1.0000 | TOPICAL_OINTMENT | Freq: Four times a day (QID) | CUTANEOUS | 0 refills | Status: DC | PRN

## 2024-01-31 NOTE — Progress Notes (Signed)
 01/31/2024  HPI: Jeff Warren is a 68 y.o. male s/p EUA, chemical sphincterotomy with Botox , hemorrhoidectomy of 2 columns, excision of posterior fissure skin tag and anal mass on 11/26/2023.  Patient presents today for follow-up.  He reports that the pain is stable to mildly improved with also improvement in the bleeding.  He now notices only sometimes blood on it when he wipes but no longer in the toilet bowl or in his underwear.  He is still working on his constipation and takes MiraLAX.  The stool he feels is soft but is also become little bit narrower in caliber and feels that he has to push against some resistance.  Vital signs: BP (!) 140/89   Pulse 66   Temp 98 F (36.7 C) (Oral)   Ht 6' 1 (1.854 m)   Wt 238 lb (108 kg)   SpO2 98%   BMI 31.40 kg/m    Physical Exam: Constitutional: No acute distress Rectal: The patient's posterior fissure is healing still with a narrower area posteriorly overall tissue.  There is no active bleeding.  Digital rectal exam does not reveal any masses internally.  There is some mild narrowing noted of the anal canal itself likely related to scar tissue from the surgery.  However when he pushes for Valsalva, the canal dilates appropriately.  Assessment/Plan: This is a 68 y.o. male s/p EUA, chemical sphincterotomy with Botox , hemorrhoidectomy of 2 columns, excision of posterior fissure skin tag and anal mass.  - Discussed with patient that the tissues are still healing although slowly.  He did have a wide anal fissure area and this has been improving overall.  Discussed with him that the pain could be related still from the raw tissue.  It appears that there may be some slight narrowing of the anal canal although it dilates appropriately when pushing.  However this may contribute with the sensation of pushing against some resistance.  This can improve with time as the tissues heal and the scar tissue softens.  For now the effects of Botox  are still ongoing and we  can wait for the posterior fissure area to continue healing. - Discussed with him that if in 2 more months, there is still no improvement or changes with the pain or bleeding, then we should see him back to discuss whether we try nifedipine again versus a second round of Botox . - Return precautions given. - Potentially follow-up in 2 months.   [name], MD [facility]

## 2024-01-31 NOTE — Patient Instructions (Signed)
 Surgical Procedures for Hemorrhoids, Care After After surgery for hemorrhoids, it is common to have: Pain in your rectum for 2-4 weeks after the procedure, and may take 1-2 months to fully recover. Pain when you are pooping. A small amount of bleeding from your rectum. This is more likely to happen the first time you poop after surgery. Follow these instructions at home: Medicines Take over-the-counter and prescription medicines only as told by your health care provider. If you were prescribed antibiotics, use them as told by your provider. Do not stop using the antibiotic even if you start to feel better. Ask your provider if the medicine prescribed to you requires you to avoid driving or using machinery. Use a stool softener or a medicine that helps you poop (laxative) as told by your provider. Eating and drinking Follow instructions from your provider about what you may eat and drink after the surgery. You may need to take these actions to prevent or treat constipation: Drink enough fluid to keep your pee (urine) pale yellow. Take over-the-counter or prescription medicines. Eat foods that are high in fiber, such as beans, whole grains, and fresh fruits and vegetables. Limit foods that are high in fat and processed sugars, such as fried or sweet foods. Managing pain and swelling  Take warm sitz baths for 15-20 minutes, 2-3 times a day to ease soreness and itching. This will also help keep the rectal area clean. If told, put ice on the affected area. It may help to use ice packs between sitz baths. Put ice in a plastic bag. Place a towel between your skin and the bag. Leave the ice on for 20 minutes, 2-3 times a day. If your skin turns bright red, remove the ice right away to prevent skin damage. The risk of damage is higher if you cannot feel pain, heat, or cold. Activity Rest as told by your provider. Do not sit for a long time without moving. Get up to take short walks every 1-2  hours. This will improve blood flow and breathing. Ask for help if you feel weak or unsteady. You may have to avoid lifting. Ask your provider how much you can safely lift. Return to your normal activities as told by your provider. Ask your provider what activities are safe for you. General instructions Do not strain to poop. Do not spend a long time sitting on the toilet. If you were given a sedative during the surgery, it can affect you for several hours. Do not drive or operate machinery until your provider says that it is safe. Your provider may give you more instructions. Make sure you know what you can and cannot do. Contact a health care provider if: Your pain medicine is not helping. You have a fever or chills. You have drainage that smells bad. You have a lot of swelling. You become constipated. You have trouble peeing (urinating). You have very bad pain in your rectum. Get help right away if: You are bleeding a lot from your rectum. This information is not intended to replace advice given to you by your health care provider. Make sure you discuss any questions you have with your health care provider. Document Revised: 01/16/2022 Document Reviewed: 01/16/2022 Elsevier Patient Education  2024 [employer].

## 2024-02-01 ENCOUNTER — Telehealth: Payer: Self-pay | Admitting: Family Medicine

## 2024-02-01 DIAGNOSIS — M109 Gout, unspecified: Secondary | ICD-10-CM

## 2024-02-01 MED ORDER — COLCHICINE 0.6 MG PO TABS: ORAL_TABLET | ORAL | 2 refills | Status: AC

## 2024-02-01 NOTE — Telephone Encounter (Signed)
 [facility] faxed refill request for the following medications:  colchicine  0.6 MG tablet    Please advise.

## 2024-03-10 ENCOUNTER — Other Ambulatory Visit: Payer: Self-pay | Admitting: Cardiovascular Disease

## 2024-03-10 MED ORDER — SACUBITRIL-VALSARTAN 49-51 MG PO TABS: 1.0000 | ORAL_TABLET | Freq: Two times a day (BID) | ORAL | 2 refills | Status: DC

## 2024-03-21 ENCOUNTER — Encounter: Payer: Self-pay | Admitting: Family Medicine

## 2024-03-21 ENCOUNTER — Ambulatory Visit (INDEPENDENT_AMBULATORY_CARE_PROVIDER_SITE_OTHER): Admitting: Family Medicine

## 2024-03-21 VITALS — BP 109/75 | HR 72 | Temp 98.1°F | Ht 73.0 in | Wt 248.9 lb

## 2024-03-21 DIAGNOSIS — I872 Venous insufficiency (chronic) (peripheral): Secondary | ICD-10-CM

## 2024-03-21 DIAGNOSIS — H9313 Tinnitus, bilateral: Secondary | ICD-10-CM

## 2024-03-21 DIAGNOSIS — M1A09X0 Idiopathic chronic gout, multiple sites, without tophus (tophi): Secondary | ICD-10-CM

## 2024-03-21 DIAGNOSIS — M109 Gout, unspecified: Secondary | ICD-10-CM

## 2024-03-21 DIAGNOSIS — E782 Mixed hyperlipidemia: Secondary | ICD-10-CM

## 2024-03-21 DIAGNOSIS — I10 Essential (primary) hypertension: Secondary | ICD-10-CM | POA: Diagnosis not present

## 2024-03-21 NOTE — Assessment & Plan Note (Addendum)
 Chronic, stable. Continue current antihypertensives. Recheck BMP today.

## 2024-03-21 NOTE — Progress Notes (Signed)
 Which     Established patient visit   Patient: Jeff Warren   DOB: [birth year]   68 y.o. Male  MRN: [id number] Visit Date: 03/21/2024  Today's healthcare provider: LAURAINE LOISE BUOY, DO   Chief Complaint  Patient presents with   Medical Management of Chronic Issues    Patient states he is here for a follow up from going on new medication Allopurinol .  Reports since going on the medication he is still having some issues with GOUT, states it has improved from what it was.  Flu vaccine- declined   Subjective    HPI Jeff Warren is a 68 year old male who presents for follow-up regarding his gout.  He has been experiencing low blood pressure readings in his right arm, which has been a concern during previous visits to other doctors. He recalls an instance where consistent readings could not be obtained from his right arm. He has not seen a vascular surgeon for this issue, but he has had stents placed by a cardiologist. No dizziness, blurred vision, or balance issues are reported.  He experiences intermittent edema, particularly in one leg, and has varicose veins. These symptoms have been present for a long time and are not painful, though they cause intermittent swelling. He denies dizziness, blurred vision, or balance issues.  He has a history of gout affecting various joints, including his knees and feet. He is currently on allopurinol  50 mg, which he takes as soon as he feels symptoms starting, usually resolving the issue within a few days.  His dose is limited due to kidney disease, borderline CKD 2/CKD 3.  He experiences chronic pain in his feet and limps, and he speculates it may be due to joint damage or osteoarthritis. He previously used probenecid for gout management but stopped due to concerns about kidney health. He switched from carvedilol  to metoprolol  to allow for colchicine  use if needed.  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 He describes the tinnitus as a constant squealing sound that he can hear even while talking, though it no longer disrupts his sleep.  He is concerned about his diet, which includes white chicken, flounder, and catfish, but avoids red meat, shellfish, and organ meats. He drinks diet soda and tries to avoid high fructose corn syrup, though he acknowledges consuming it in some foods. He has a family history of gout, particularly among his Japanese relatives, and notes that his uncle and cousin also suffer from it. He is aware that Japanese individuals may experience episodes of gout even at normal uric acid levels.  He has a history of heart surgery and was previously on hydrochlorothiazide, which he believes contributed to his gout. He is on rosuvastatin  for cholesterol management but is concerned about its impact on his leg strength. He has not increased the dose due to these concerns.      Medications: Outpatient Medications Prior to Visit  Medication Sig   allopurinol  (ZYLOPRIM ) 100 MG tablet Take 0.5 tablets (50 mg total) by mouth daily.   aspirin  EC 81 MG tablet Take 81 mg by mouth daily. Swallow whole.   Cholecalciferol  (DIALYVITE VITAMIN D  5000) 125 MCG (5000 UT) capsule Take 5,000 Units by mouth daily.   clopidogrel  (PLAVIX ) 75 MG tablet Take 1 tablet by mouth once daily with breakfast   colchicine  0.6 MG tablet Take 1.2 mg (two 0.6-mg tablets)  orally at the first sign of a flare followed by 0.6 mg (1 tablet) one hour later; after twelve hours, may start 1 tab twice per day for the duration of the flare up to a max of 7 days (but discontinue for stomach pains or diarrhea)   ezetimibe  (ZETIA ) 10 MG tablet Take 1 tablet (10 mg total) by mouth daily.   fluticasone  (FLONASE ) 50 MCG/ACT nasal spray Place 1 spray into both nostrils at bedtime.   JARDIANCE  10 MG TABS tablet TAKE ONE TABLET BY MOUTH DAILY   lidocaine  (XYLOCAINE ) 5 %  ointment Apply 1 Application topically 4 (four) times daily as needed for mild pain (pain score 1-3).   Menaquinone-7 (VITAMIN K2 PO) Take 1 tablet by mouth daily.   metoprolol  succinate (TOPROL -XL) 50 MG 24 hr tablet Take 1 tablet (50 mg total) by mouth daily. Take with or immediately following a meal.   nitroGLYCERIN  (NITROSTAT ) 0.4 MG SL tablet Place 1 tablet (0.4 mg total) under the tongue every 5 (five) minutes as needed for chest pain.   omeprazole  (PRILOSEC  OTC) 20 MG tablet Take 20 mg by mouth every other day.   Povidone, PF, (IVIZIA DRY EYES) 0.5 % SOLN Place 1 drop into both eyes daily as needed (dry eyes).   rosuvastatin  (CRESTOR ) 20 MG tablet Take 1 tablet (20 mg total) by mouth daily.   sacubitril -valsartan  (ENTRESTO ) 49-51 MG Take 1 tablet by mouth 2 (two) times daily.   spironolactone  (ALDACTONE ) 25 MG tablet Take 0.5 tablets (12.5 mg total) by mouth daily.   No facility-administered medications prior to visit.        Objective    BP 109/75 (BP Location: Left Arm, Patient Position: Sitting, Cuff Size: Normal)   Pulse 72   Temp 98.1 F (36.7 C) (Oral)   Ht 6' 1 (1.854 m)   Wt 248 lb 14.4 oz (112.9 kg)   SpO2 100%   BMI 32.84 kg/m     Physical Exam Vitals and nursing note reviewed.  Constitutional:      General: He is not in acute distress.    Appearance: Normal appearance.  HENT:     Head: Normocephalic and atraumatic.  Eyes:     General: No scleral icterus.    Conjunctiva/sclera: Conjunctivae normal.  Cardiovascular:     Rate and Rhythm: Normal rate.  Pulmonary:     Effort: Pulmonary effort is normal.  Neurological:     Mental Status: He is alert and oriented to person, place, and time. Mental status is at baseline.  Psychiatric:        Mood and Affect: Mood normal.        Behavior: Behavior normal.      Results for orders placed or performed in visit on 03/21/24  Basic Metabolic Panel (BMET)  Result Value Ref Range   Glucose 81 70 - 99 mg/dL    BUN 25 8 - 27 mg/dL   Creatinine, Ser 8.76 0.76 - 1.27 mg/dL   eGFR 64 >40 fO/fpw/8.26   BUN/Creatinine Ratio 20 10 - 24   Sodium 139 134 - 144 mmol/L   Potassium 4.4 3.5 - 5.2 mmol/L   Chloride 102 96 - 106 mmol/L   CO2 20 20 - 29 mmol/L   Calcium  9.1 8.6 - 10.2 mg/dL  Lipid panel  Result Value Ref Range   Cholesterol, Total 152 100 - 199 mg/dL   Triglycerides 818 (H) 0 - 149 mg/dL   HDL 39 (L) >60 mg/dL   VLDL Cholesterol  Cal 31 5 - 40 mg/dL   LDL Chol Calc (NIH) 82 0 - 99 mg/dL   Chol/HDL Ratio 3.9 0.0 - 5.0 ratio    Assessment & Plan    Essential hypertension Assessment & Plan: Chronic, stable. Continue current antihypertensives. Recheck BMP today.  Orders: -     Basic metabolic panel with GFR  Chronic gout of multiple sites, unspecified cause  Mixed hyperlipidemia Assessment & Plan: Chronic, stable.  Experienced some leg pain on rosuvastatin  40 mg.  He is currently tolerating 20 mg daily.  Continue without changes.  Recheck lipid panel today.  Orders: -     Lipid panel  Tinnitus of both ears  Chronic venous insufficiency of lower extremity     Chronic gout of multiple sites, unspecified cause Gout flares in knees and feet, with recent flare in foot. Pain likely from potential joint damage during gout flares. Allopurinol  effective in reducing flare duration. Family history suggests genetic predisposition. Discussed kidney function and medication side effects. Colchicine  considered for alternative prophylaxis but patient prefers to continue current management unless symptoms worsen. - Continue allopurinol  as prescribed. - Continue colchicine  as needed for acute flares. - Monitor dietary intake to avoid known gout triggers. - Consider transition to colchicine  as a preventative with low dose if symptoms worsen.  Chronic venous insufficiency of lower extremity Chronic venous insufficiency with varicose veins more pronounced in one leg, with intermittent edema.  Long-standing condition that is overall unchanged. No acute concerns. Continue to monitor.   Tinnitus of both ears Tinnitus with high-frequency hearing loss present for 25 years, likely due to noise exposure. Tinnitus constant but not disruptive.    Return in about 3 months (around 06/21/2024) for Chronic f/u.      I discussed the assessment and treatment plan with the patient  The patient was provided an opportunity to ask questions and all were answered. The patient agreed with the plan and demonstrated an understanding of the instructions.   The patient was advised to call back or seek an in-person evaluation if the symptoms worsen or if the condition fails to improve as anticipated.    LAURAINE LOISE BUOY, DO  [facility] [phone] (phone) [phone] (fax)  [facility]

## 2024-03-22 LAB — BASIC METABOLIC PANEL WITH GFR
BUN/Creatinine Ratio: 20 (ref 10–24)
BUN: 25 mg/dL (ref 8–27)
CO2: 20 mmol/L (ref 20–29)
Calcium: 9.1 mg/dL (ref 8.6–10.2)
Chloride: 102 mmol/L (ref 96–106)
Creatinine, Ser: 1.23 mg/dL (ref 0.76–1.27)
Glucose: 81 mg/dL (ref 70–99)
Potassium: 4.4 mmol/L (ref 3.5–5.2)
Sodium: 139 mmol/L (ref 134–144)
eGFR: 64 mL/min/1.73 (ref >59)

## 2024-03-22 LAB — LIPID PANEL
Chol/HDL Ratio: 3.9 ratio (ref 0.0–5.0)
Cholesterol, Total: 152 mg/dL (ref 100–199)
HDL: 39 mg/dL — ABNORMAL LOW (ref >39)
LDL Chol Calc (NIH): 82 mg/dL (ref 0–99)
Triglycerides: 181 mg/dL — ABNORMAL HIGH (ref 0–149)
VLDL Cholesterol Cal: 31 mg/dL (ref 5–40)

## 2024-03-30 ENCOUNTER — Ambulatory Visit: Payer: Self-pay | Admitting: Family Medicine

## 2024-04-04 ENCOUNTER — Ambulatory Visit: Admitting: Family Medicine

## 2024-04-09 NOTE — Assessment & Plan Note (Signed)
 Chronic, stable.  Experienced some leg pain on rosuvastatin  40 mg.  He is currently tolerating 20 mg daily.  Continue without changes.  Recheck lipid panel today.

## 2024-05-09 ENCOUNTER — Encounter: Payer: Self-pay | Admitting: Student

## 2024-05-09 ENCOUNTER — Ambulatory Visit: Attending: Student | Admitting: Student

## 2024-05-09 VITALS — BP 130/72 | HR 84 | Ht 72.0 in | Wt 255.8 lb

## 2024-05-09 DIAGNOSIS — I10 Essential (primary) hypertension: Secondary | ICD-10-CM | POA: Diagnosis not present

## 2024-05-09 DIAGNOSIS — I2510 Atherosclerotic heart disease of native coronary artery without angina pectoris: Secondary | ICD-10-CM | POA: Diagnosis not present

## 2024-05-09 DIAGNOSIS — I5022 Chronic systolic (congestive) heart failure: Secondary | ICD-10-CM | POA: Insufficient documentation

## 2024-05-09 DIAGNOSIS — E785 Hyperlipidemia, unspecified: Secondary | ICD-10-CM | POA: Insufficient documentation

## 2024-05-09 MED ORDER — SACUBITRIL-VALSARTAN 49-51 MG PO TABS: 1.0000 | ORAL_TABLET | Freq: Two times a day (BID) | ORAL | 3 refills | Status: AC

## 2024-05-09 NOTE — Progress Notes (Signed)
 Cardiology Clinic Note   Date: 05/09/2024 ID: Tai, Skelly July 06, 1955, MRN [id number]  Primary Cardiologist:  Deatrice Cage, MD  Chief Complaint   WILFORD MERRYFIELD is a 68 y.o. male who presents to the clinic today for routine follow up.   Patient Profile   AYDENN GERVIN is followed by Dr. Cage for the history outlined below.      Past medical history significant for: CAD. LHC 12/05/2016 (STEMI): Distal RCA 100%.  Mid to distal RCA 30%.  Proximal to mid LAD 80%.  D1 100%.  Mild LVH systolic dysfunction.  EF 45 to 50%.  Severely elevated LVEDP.  PCI with DES 3.5 x 15 mm to distal RCA. Staged PCI 12/23/2016: Mid to distal RCA 30%.  D1 100% with right to left collaterals.  Proximal to mid LAD 80%.  PCI with DES 3 x 23 mm to proximal to mid LAD. R/LHC 05/26/2023 (acute on chronic systolic heart failure): D1 100%.  Mid LAD 70%.  Ostial LM 20%.  Mid to distal RCA 40%.  Distal RCA 40%.  Ramus 95%.  PCI with DES 3.0 x 24 mm to proximal ramus.  Successful balloon angioplasty to mid LAD in-stent restenosis. Chronic systolic heart failure. R/LHC 05/26/2023: Severely elevated RA pressure, severe pulmonary hypertension, moderate to severely elevated wedge pressure, severely reduced cardiac output.  LVEDP severely elevated in the setting of frequent PVCs. Echo 09/03/2023: EF 40 to 45%.  Moderate asymmetric LVH of the septal segment.  Grade I DD.  Normal RV function.  Mild MR/AI.  Aortic root normal in size and structure. Hypertension. Hyperlipidemia. LPa 05/27/2023: 171.8. Lipid panel 03/21/2024: LDL 82, HDL 39, TG 181, total 152. OSA. Family history of premature CAD.  In summary, patient has a history of STEMI in June 2018.  He underwent PCI with DES to distal RCA followed by staged PCI in July 2018 with DES to proximal to mid LAD as detailed above.  Echo showed EF 50 to 55%, moderate concentric LVH, mild hypokinesis of inferior myocardium, Grade I DD, mild LAE.  Repeat echo August 2018 showed EF 55 to  60%, no RWMA, Grade I DD, trivial AI, mild MR, mild LAE.  Patient was seen for overdue follow-up in September 2022 and complained of worsening dyspnea and fatigue with intermittent lower extremity edema.  Echo was updated at that time and demonstrated EF 45 to 50%, mild hypokinesis of left ventricular entire inferolateral wall, Grade II DD, normal RV size/function, mild MR, mild dilatation of aortic root 40 mm.  In November 2024 patient was seen for overdue follow-up and complained of exertional dyspnea with intermittent chest tightness similar to previous angina.  He reported stopping most medications and taking some intermittently.  He was scheduled for a cardiac PET/CT and repeat echo.  Echo demonstrated EF 30 to 35% as detailed above.  Cardiac PET/CT was canceled and he underwent R/LHC.  He underwent PCI with stent placement and balloon angioplasty as detailed above.  Right heart cath demonstrated elevated heart pressures as detailed above.  Patient was last seen in the office by Dr. Cage on 06/04/2023 for follow-up after heart catheterization.  He reported significant improvement in symptoms with resolution of chest pain and orthopnea.  He was adherent to medications.   Patient was seen in the clinic on 08/09/2023 for routine follow-up.  He reported stopping Lasix  secondary to a bad gout flare with no increase in edema or weight.  Spironolactone  was added.  Discussed stopping carvedilol  and starting  Toprol  so patient could take colchicine .  He wanted to hold off at the time of his visit.  Patient contacted the office in early April requesting to change carvedilol  to Toprol  secondary to gout flare.  Patient contacted the office on 10/18/2023 with inquiry regarding stopping DAPT for surgery. He was informed DAPT would need to be continued for at least 6 months from time PCI.    Patient was last seen in the office by me on 11/09/2023 for preop evaluation.  He was pending hemorrhoidectomy and felt to be an  acceptable risk for surgery.     History of Present Illness    Today, patient is doing well. Patient denies orthopnea or PND. He reports occasional lower extremity edema. He has dyspnea with heavier exertion like navigating stairs but none with routine activities. He reports noticing some wheezing when laying down at night for the last couple of days. However, everyone in his home has been dealing with a URI. He also reports difficulty with allergies. He reports continued episodes of brief twinges of chest pain not related to exertional activities. No palpitations. He is mostly sedentary. His activity is limited secondary to back pain and gout. He has been steadily gaining weight and attributes this to overeating.       ROS: All other systems reviewed and are otherwise negative except as noted in History of Present Illness.  EKGs/Labs Reviewed    EKG Interpretation Date/Time:  Tuesday May 09 2024 14:48:07 EST Ventricular Rate:  84 PR Interval:  184 QRS Duration:  114 QT Interval:  396 QTC Calculation: 467 R Axis:   -64  Text Interpretation: Normal sinus rhythm Possible Left atrial enlargement Left anterior fascicular block Minimal voltage criteria for LVH, may be normal variant ( Cornell product ) When compared with ECG of 09-Nov-2023 10:27, No significant change was found Confirmed by Loistine Sober [phone]) on 05/09/2024 2:52:32 PM   11/09/2023: ALT 17; AST 21 03/21/2024: BUN 25; Creatinine, Ser 1.23; Potassium 4.4; Sodium 139   08/23/2023: Hemoglobin 16.9; WBC 5.5    Physical Exam    VS:  BP 130/72 (BP Location: Left Arm, Patient Position: Sitting, Cuff Size: Normal)   Pulse 84   Ht 6' (1.829 m)   Wt 255 lb 12.8 oz (116 kg)   SpO2 99%   BMI 34.69 kg/m  , BMI Body mass index is 34.69 kg/m.  GEN: Well nourished, well developed, in no acute distress. Neck: No JVD or carotid bruits. Cardiac:  RRR.  No murmur. No rubs or gallops.   Respiratory:  Respirations regular and  unlabored. Clear to auscultation without rales, wheezing or rhonchi. GI: Soft, nontender, nondistended. Extremities: Radials/DP/PT 2+ and equal bilaterally. No clubbing or cyanosis. No edema  Skin: Warm and dry, no rash. Neuro: Strength intact.  Assessment & Plan   CAD S/p PCI with DES to distal RCA June 2018 in the setting of STEMI.  He underwent staged PCI with DES to mid LAD in July 2018.  He developed acute on chronic systolic heart failure and underwent PCI with DES to proximal ramus and balloon angioplasty to mid LAD in-stent restenosis in December 2024.  Patient reports occasional brief twinges of chest pain not related to exertional activities. He has not needed to take NTG stating it does not last long enough for me to take it. EKG today without acute changes.  - Continue aspirin , Plavix , Toprol , rosuvastatin , as needed SL NTG.   Chronic systolic heart failure Echo December 2024 showed EF  30 to 35%, global hypokinesis, moderate LVH, moderately reduced RV function, moderately elevated PA pressure, mild to moderate MR, mild AI.  Right heart catheterization December 2024 demonstrated severely elevated RA pressure, severe pulmonary hypertension, moderate to severely elevated wedge pressure, severely reduced cardiac output. Repeat limited echo March 2025 showed EF 40-45%, grade I DD.  Patient reports dyspnea with heavier exertion but not routine activities.  His weight has been slowly increasing and he attributes this to overeating and being mostly sedentary. He has not noted big jumps in weight overnight.  Euvolemic and well compensated on exam. - Continue Toprol , Entresto , Jardiance , spironolactone , Lasix .    Hypertension BP today 130/72. No headaches or dizziness reported.  -Continue Toprol , Entresto , spironolactone .   Hyperlipidemia LDL 82 October 2025, not at goal. Patient reports he has mild weakness in bilateral lower extremities that worsened with higher dose of statin. Discussed  making dietary improvements and increasing physical activity.  - Continue rosuvastatin  and Zetia . - Increase physical activity as tolerated.   Disposition: Return in 6 months or sooner as needed.          Signed, Barnie HERO. Leann Mayweather, DNP, NP-C

## 2024-05-09 NOTE — Patient Instructions (Signed)
 Medication Instructions:   Your physician recommends that you continue on your current medications as directed. Please refer to the Current Medication list given to you today.    *If you need a refill on your cardiac medications before your next appointment, please call your pharmacy*  Lab Work:  None ordered at this time   If you have labs (blood work) drawn today and your tests are completely normal, you will receive your results only by:  MyChart Message (if you have MyChart) OR  A paper copy in the mail If you have any lab test that is abnormal or we need to change your treatment, we will call you to review the results.  Testing/Procedures:  None ordered at this time   Referrals:  None ordered at this time   Follow-Up:  At [facility], you and your health needs are our priority.  As part of our continuing mission to provide you with exceptional heart care, our providers are all part of one team.  This team includes your primary Cardiologist (physician) and Advanced Practice Providers or APPs (Physician Assistants and Nurse Practitioners) who all work together to provide you with the care you need, when you need it.  Your next appointment:   5 - 6 month(s)  Provider:    Deatrice Cage, MD or Barnie Hila, NP    We recommend signing up for the patient portal called MyChart.  Sign up information is provided on this After Visit Summary.  MyChart is used to connect with patients for Virtual Visits (Telemedicine).  Patients are able to view lab/test results, encounter notes, upcoming appointments, etc.  Non-urgent messages can be sent to your provider as well.   To learn more about what you can do with MyChart, go to [email or web address].

## 2024-06-29 ENCOUNTER — Other Ambulatory Visit: Payer: Self-pay | Admitting: Student

## 2024-08-23 ENCOUNTER — Encounter: Admitting: Family Medicine

## 2024-10-09 ENCOUNTER — Ambulatory Visit: Admitting: Student
